# Patient Record
Sex: Female | Born: 1935 | Race: White | Hispanic: No | Smoking: Never smoker
Health system: Southern US, Community
[De-identification: ages and names within clinical notes are randomized; demographics above are authoritative.]

## PROBLEM LIST (undated history)

## (undated) DIAGNOSIS — I509 Heart failure, unspecified: Secondary | ICD-10-CM

## (undated) DIAGNOSIS — I4891 Unspecified atrial fibrillation: Secondary | ICD-10-CM

## (undated) DIAGNOSIS — N182 Chronic kidney disease, stage 2 (mild): Secondary | ICD-10-CM

## (undated) DIAGNOSIS — E1122 Type 2 diabetes mellitus with diabetic chronic kidney disease: Secondary | ICD-10-CM

## (undated) DIAGNOSIS — I10 Essential (primary) hypertension: Secondary | ICD-10-CM

## (undated) DIAGNOSIS — I8390 Asymptomatic varicose veins of unspecified lower extremity: Secondary | ICD-10-CM

## (undated) DIAGNOSIS — Z8673 Personal history of transient ischemic attack (TIA), and cerebral infarction without residual deficits: Secondary | ICD-10-CM

## (undated) DIAGNOSIS — E785 Hyperlipidemia, unspecified: Secondary | ICD-10-CM

## (undated) HISTORY — DX: Chronic kidney disease, stage 2 (mild): N18.2

## (undated) HISTORY — PX: APPENDECTOMY: SHX54

## (undated) HISTORY — DX: Unspecified atrial fibrillation: I48.91

## (undated) HISTORY — PX: KNEE SURGERY: SHX244

## (undated) HISTORY — PX: CHOLECYSTECTOMY: SHX55

## (undated) HISTORY — DX: Heart failure, unspecified: I50.9

## (undated) HISTORY — DX: Type 2 diabetes mellitus with diabetic chronic kidney disease: E11.22

---

## 2004-02-16 ENCOUNTER — Emergency Department: Payer: Self-pay | Admitting: Emergency Medicine

## 2004-02-16 ENCOUNTER — Other Ambulatory Visit: Payer: Self-pay

## 2004-12-02 ENCOUNTER — Ambulatory Visit: Payer: Self-pay | Admitting: Internal Medicine

## 2005-05-01 ENCOUNTER — Emergency Department: Payer: Self-pay | Admitting: Emergency Medicine

## 2005-12-12 ENCOUNTER — Ambulatory Visit: Payer: Self-pay | Admitting: Internal Medicine

## 2006-08-10 ENCOUNTER — Ambulatory Visit: Payer: Self-pay | Admitting: Internal Medicine

## 2007-02-13 ENCOUNTER — Ambulatory Visit: Payer: Self-pay | Admitting: Internal Medicine

## 2008-01-21 ENCOUNTER — Ambulatory Visit: Payer: Self-pay | Admitting: Ophthalmology

## 2008-02-03 ENCOUNTER — Ambulatory Visit: Payer: Self-pay | Admitting: Ophthalmology

## 2008-02-26 ENCOUNTER — Ambulatory Visit: Payer: Self-pay | Admitting: Internal Medicine

## 2009-01-26 ENCOUNTER — Ambulatory Visit: Payer: Self-pay | Admitting: Ophthalmology

## 2009-02-02 ENCOUNTER — Ambulatory Visit: Payer: Self-pay | Admitting: Ophthalmology

## 2009-07-28 ENCOUNTER — Inpatient Hospital Stay: Payer: Self-pay | Admitting: Internal Medicine

## 2010-07-29 ENCOUNTER — Ambulatory Visit: Payer: Self-pay | Admitting: Internal Medicine

## 2011-02-01 ENCOUNTER — Ambulatory Visit: Payer: Self-pay | Admitting: Internal Medicine

## 2011-02-02 ENCOUNTER — Ambulatory Visit: Payer: Self-pay | Admitting: Internal Medicine

## 2011-02-07 ENCOUNTER — Ambulatory Visit: Payer: Self-pay | Admitting: Internal Medicine

## 2011-04-07 DIAGNOSIS — E119 Type 2 diabetes mellitus without complications: Secondary | ICD-10-CM | POA: Diagnosis not present

## 2011-04-07 DIAGNOSIS — E785 Hyperlipidemia, unspecified: Secondary | ICD-10-CM | POA: Diagnosis not present

## 2011-08-11 DIAGNOSIS — E119 Type 2 diabetes mellitus without complications: Secondary | ICD-10-CM | POA: Diagnosis not present

## 2011-08-11 DIAGNOSIS — E785 Hyperlipidemia, unspecified: Secondary | ICD-10-CM | POA: Diagnosis not present

## 2011-08-16 DIAGNOSIS — E119 Type 2 diabetes mellitus without complications: Secondary | ICD-10-CM | POA: Diagnosis not present

## 2011-08-16 DIAGNOSIS — M1990 Unspecified osteoarthritis, unspecified site: Secondary | ICD-10-CM | POA: Diagnosis not present

## 2011-08-16 DIAGNOSIS — E785 Hyperlipidemia, unspecified: Secondary | ICD-10-CM | POA: Diagnosis not present

## 2011-08-16 DIAGNOSIS — R51 Headache: Secondary | ICD-10-CM | POA: Diagnosis not present

## 2011-12-15 DIAGNOSIS — E785 Hyperlipidemia, unspecified: Secondary | ICD-10-CM | POA: Diagnosis not present

## 2011-12-15 DIAGNOSIS — Z23 Encounter for immunization: Secondary | ICD-10-CM | POA: Diagnosis not present

## 2011-12-15 DIAGNOSIS — E119 Type 2 diabetes mellitus without complications: Secondary | ICD-10-CM | POA: Diagnosis not present

## 2011-12-25 DIAGNOSIS — R51 Headache: Secondary | ICD-10-CM | POA: Diagnosis not present

## 2011-12-25 DIAGNOSIS — M1990 Unspecified osteoarthritis, unspecified site: Secondary | ICD-10-CM | POA: Diagnosis not present

## 2011-12-25 DIAGNOSIS — E785 Hyperlipidemia, unspecified: Secondary | ICD-10-CM | POA: Diagnosis not present

## 2011-12-25 DIAGNOSIS — E119 Type 2 diabetes mellitus without complications: Secondary | ICD-10-CM | POA: Diagnosis not present

## 2012-01-12 ENCOUNTER — Emergency Department: Payer: Self-pay | Admitting: Emergency Medicine

## 2012-01-12 DIAGNOSIS — R404 Transient alteration of awareness: Secondary | ICD-10-CM | POA: Diagnosis not present

## 2012-01-12 DIAGNOSIS — E119 Type 2 diabetes mellitus without complications: Secondary | ICD-10-CM | POA: Diagnosis not present

## 2012-01-12 DIAGNOSIS — G43909 Migraine, unspecified, not intractable, without status migrainosus: Secondary | ICD-10-CM | POA: Diagnosis not present

## 2012-01-12 DIAGNOSIS — I6350 Cerebral infarction due to unspecified occlusion or stenosis of unspecified cerebral artery: Secondary | ICD-10-CM | POA: Diagnosis not present

## 2012-01-12 DIAGNOSIS — Z79899 Other long term (current) drug therapy: Secondary | ICD-10-CM | POA: Diagnosis not present

## 2012-01-12 DIAGNOSIS — F29 Unspecified psychosis not due to a substance or known physiological condition: Secondary | ICD-10-CM | POA: Diagnosis not present

## 2012-01-12 LAB — COMPREHENSIVE METABOLIC PANEL
Anion Gap: 8 (ref 7–16)
BUN: 17 mg/dL (ref 7–18)
Bilirubin,Total: 0.5 mg/dL (ref 0.2–1.0)
Calcium, Total: 8.9 mg/dL (ref 8.5–10.1)
Chloride: 106 mmol/L (ref 98–107)
Co2: 23 mmol/L (ref 21–32)
EGFR (African American): >60
EGFR (Non-African Amer.): >60
Osmolality: 280 (ref 275–301)
Potassium: 3.6 mmol/L (ref 3.5–5.1)
SGOT(AST): 33 U/L (ref 15–37)
Sodium: 137 mmol/L (ref 136–145)
Total Protein: 6.9 g/dL (ref 6.4–8.2)

## 2012-01-12 LAB — URINALYSIS, COMPLETE
Blood: NEGATIVE
Glucose,UR: NEGATIVE mg/dL (ref 0–75)
Ph: 6 (ref 4.5–8.0)
Protein: NEGATIVE
RBC,UR: 1 /HPF (ref 0–5)
Squamous Epithelial: <1

## 2012-01-12 LAB — CBC
HCT: 37.9 % (ref 35.0–47.0)
MCHC: 34.8 g/dL (ref 32.0–36.0)
MCV: 92 fL (ref 80–100)
RDW: 15.3 % — ABNORMAL HIGH (ref 11.5–14.5)
WBC: 5.2 10*3/uL (ref 3.6–11.0)

## 2012-01-16 DIAGNOSIS — R413 Other amnesia: Secondary | ICD-10-CM | POA: Diagnosis not present

## 2012-01-16 DIAGNOSIS — E785 Hyperlipidemia, unspecified: Secondary | ICD-10-CM | POA: Diagnosis not present

## 2012-01-16 DIAGNOSIS — R51 Headache: Secondary | ICD-10-CM | POA: Diagnosis not present

## 2012-01-30 ENCOUNTER — Ambulatory Visit: Payer: Self-pay | Admitting: Neurology

## 2012-01-30 DIAGNOSIS — R413 Other amnesia: Secondary | ICD-10-CM | POA: Diagnosis not present

## 2012-01-30 DIAGNOSIS — R51 Headache: Secondary | ICD-10-CM | POA: Diagnosis not present

## 2012-02-12 DIAGNOSIS — R5383 Other fatigue: Secondary | ICD-10-CM | POA: Diagnosis not present

## 2012-02-12 DIAGNOSIS — R5381 Other malaise: Secondary | ICD-10-CM | POA: Diagnosis not present

## 2012-02-12 DIAGNOSIS — G478 Other sleep disorders: Secondary | ICD-10-CM | POA: Diagnosis not present

## 2012-02-12 DIAGNOSIS — R413 Other amnesia: Secondary | ICD-10-CM | POA: Diagnosis not present

## 2012-02-12 DIAGNOSIS — R51 Headache: Secondary | ICD-10-CM | POA: Diagnosis not present

## 2012-03-11 DIAGNOSIS — R51 Headache: Secondary | ICD-10-CM | POA: Diagnosis not present

## 2012-03-11 DIAGNOSIS — E785 Hyperlipidemia, unspecified: Secondary | ICD-10-CM | POA: Diagnosis not present

## 2012-03-11 DIAGNOSIS — R413 Other amnesia: Secondary | ICD-10-CM | POA: Diagnosis not present

## 2012-04-05 DIAGNOSIS — L820 Inflamed seborrheic keratosis: Secondary | ICD-10-CM | POA: Diagnosis not present

## 2012-04-05 DIAGNOSIS — Z85828 Personal history of other malignant neoplasm of skin: Secondary | ICD-10-CM | POA: Diagnosis not present

## 2012-04-09 DIAGNOSIS — R413 Other amnesia: Secondary | ICD-10-CM | POA: Diagnosis not present

## 2012-04-09 DIAGNOSIS — E669 Obesity, unspecified: Secondary | ICD-10-CM | POA: Diagnosis not present

## 2012-04-09 DIAGNOSIS — R5382 Chronic fatigue, unspecified: Secondary | ICD-10-CM | POA: Diagnosis not present

## 2012-04-09 DIAGNOSIS — R51 Headache: Secondary | ICD-10-CM | POA: Diagnosis not present

## 2012-04-17 DIAGNOSIS — H353190 Nonexudative age-related macular degeneration, unspecified eye, stage unspecified: Secondary | ICD-10-CM | POA: Diagnosis not present

## 2012-04-19 DIAGNOSIS — E785 Hyperlipidemia, unspecified: Secondary | ICD-10-CM | POA: Diagnosis not present

## 2012-04-19 DIAGNOSIS — E119 Type 2 diabetes mellitus without complications: Secondary | ICD-10-CM | POA: Diagnosis not present

## 2012-05-22 DIAGNOSIS — M1990 Unspecified osteoarthritis, unspecified site: Secondary | ICD-10-CM | POA: Diagnosis not present

## 2012-05-22 DIAGNOSIS — E119 Type 2 diabetes mellitus without complications: Secondary | ICD-10-CM | POA: Diagnosis not present

## 2012-05-22 DIAGNOSIS — R51 Headache: Secondary | ICD-10-CM | POA: Diagnosis not present

## 2012-05-22 DIAGNOSIS — E785 Hyperlipidemia, unspecified: Secondary | ICD-10-CM | POA: Diagnosis not present

## 2012-07-29 ENCOUNTER — Emergency Department: Payer: Self-pay | Admitting: Emergency Medicine

## 2012-07-29 DIAGNOSIS — E785 Hyperlipidemia, unspecified: Secondary | ICD-10-CM | POA: Diagnosis not present

## 2012-07-29 DIAGNOSIS — Z79899 Other long term (current) drug therapy: Secondary | ICD-10-CM | POA: Diagnosis not present

## 2012-07-29 DIAGNOSIS — S0003XA Contusion of scalp, initial encounter: Secondary | ICD-10-CM | POA: Diagnosis not present

## 2012-07-29 DIAGNOSIS — S0990XA Unspecified injury of head, initial encounter: Secondary | ICD-10-CM | POA: Diagnosis not present

## 2012-07-29 DIAGNOSIS — S1093XA Contusion of unspecified part of neck, initial encounter: Secondary | ICD-10-CM | POA: Diagnosis not present

## 2012-08-28 DIAGNOSIS — E119 Type 2 diabetes mellitus without complications: Secondary | ICD-10-CM | POA: Diagnosis not present

## 2012-08-28 DIAGNOSIS — E785 Hyperlipidemia, unspecified: Secondary | ICD-10-CM | POA: Diagnosis not present

## 2012-09-16 DIAGNOSIS — M1990 Unspecified osteoarthritis, unspecified site: Secondary | ICD-10-CM | POA: Diagnosis not present

## 2012-09-16 DIAGNOSIS — E785 Hyperlipidemia, unspecified: Secondary | ICD-10-CM | POA: Diagnosis not present

## 2012-09-16 DIAGNOSIS — E119 Type 2 diabetes mellitus without complications: Secondary | ICD-10-CM | POA: Diagnosis not present

## 2012-09-16 DIAGNOSIS — R51 Headache: Secondary | ICD-10-CM | POA: Diagnosis not present

## 2012-11-07 DIAGNOSIS — R350 Frequency of micturition: Secondary | ICD-10-CM | POA: Diagnosis not present

## 2012-11-07 DIAGNOSIS — N390 Urinary tract infection, site not specified: Secondary | ICD-10-CM | POA: Diagnosis not present

## 2012-12-03 DIAGNOSIS — Z23 Encounter for immunization: Secondary | ICD-10-CM | POA: Diagnosis not present

## 2013-01-17 DIAGNOSIS — E119 Type 2 diabetes mellitus without complications: Secondary | ICD-10-CM | POA: Diagnosis not present

## 2013-01-17 DIAGNOSIS — N390 Urinary tract infection, site not specified: Secondary | ICD-10-CM | POA: Diagnosis not present

## 2013-01-17 DIAGNOSIS — E785 Hyperlipidemia, unspecified: Secondary | ICD-10-CM | POA: Diagnosis not present

## 2013-01-17 DIAGNOSIS — R51 Headache: Secondary | ICD-10-CM | POA: Diagnosis not present

## 2013-01-27 DIAGNOSIS — E785 Hyperlipidemia, unspecified: Secondary | ICD-10-CM | POA: Diagnosis not present

## 2013-01-27 DIAGNOSIS — M1990 Unspecified osteoarthritis, unspecified site: Secondary | ICD-10-CM | POA: Diagnosis not present

## 2013-01-27 DIAGNOSIS — E119 Type 2 diabetes mellitus without complications: Secondary | ICD-10-CM | POA: Diagnosis not present

## 2013-01-27 DIAGNOSIS — R51 Headache: Secondary | ICD-10-CM | POA: Diagnosis not present

## 2013-03-27 DIAGNOSIS — G44229 Chronic tension-type headache, not intractable: Secondary | ICD-10-CM | POA: Diagnosis not present

## 2013-03-27 DIAGNOSIS — R51 Headache: Secondary | ICD-10-CM | POA: Diagnosis not present

## 2013-03-27 DIAGNOSIS — Z79899 Other long term (current) drug therapy: Secondary | ICD-10-CM | POA: Diagnosis not present

## 2013-03-27 DIAGNOSIS — Z049 Encounter for examination and observation for unspecified reason: Secondary | ICD-10-CM | POA: Diagnosis not present

## 2013-04-03 DIAGNOSIS — D485 Neoplasm of uncertain behavior of skin: Secondary | ICD-10-CM | POA: Diagnosis not present

## 2013-04-03 DIAGNOSIS — L821 Other seborrheic keratosis: Secondary | ICD-10-CM | POA: Diagnosis not present

## 2013-04-03 DIAGNOSIS — Z85828 Personal history of other malignant neoplasm of skin: Secondary | ICD-10-CM | POA: Diagnosis not present

## 2013-04-30 DIAGNOSIS — G44229 Chronic tension-type headache, not intractable: Secondary | ICD-10-CM | POA: Diagnosis not present

## 2013-04-30 DIAGNOSIS — G518 Other disorders of facial nerve: Secondary | ICD-10-CM | POA: Diagnosis not present

## 2013-04-30 DIAGNOSIS — M542 Cervicalgia: Secondary | ICD-10-CM | POA: Diagnosis not present

## 2013-04-30 DIAGNOSIS — R51 Headache: Secondary | ICD-10-CM | POA: Diagnosis not present

## 2013-04-30 DIAGNOSIS — IMO0001 Reserved for inherently not codable concepts without codable children: Secondary | ICD-10-CM | POA: Diagnosis not present

## 2013-05-14 DIAGNOSIS — R51 Headache: Secondary | ICD-10-CM | POA: Diagnosis not present

## 2013-05-14 DIAGNOSIS — M542 Cervicalgia: Secondary | ICD-10-CM | POA: Diagnosis not present

## 2013-05-14 DIAGNOSIS — G518 Other disorders of facial nerve: Secondary | ICD-10-CM | POA: Diagnosis not present

## 2013-05-14 DIAGNOSIS — IMO0001 Reserved for inherently not codable concepts without codable children: Secondary | ICD-10-CM | POA: Diagnosis not present

## 2013-05-22 DIAGNOSIS — J069 Acute upper respiratory infection, unspecified: Secondary | ICD-10-CM | POA: Diagnosis not present

## 2013-05-28 DIAGNOSIS — R51 Headache: Secondary | ICD-10-CM | POA: Diagnosis not present

## 2013-05-28 DIAGNOSIS — G518 Other disorders of facial nerve: Secondary | ICD-10-CM | POA: Diagnosis not present

## 2013-05-28 DIAGNOSIS — M542 Cervicalgia: Secondary | ICD-10-CM | POA: Diagnosis not present

## 2013-05-28 DIAGNOSIS — IMO0001 Reserved for inherently not codable concepts without codable children: Secondary | ICD-10-CM | POA: Diagnosis not present

## 2013-06-03 DIAGNOSIS — C44319 Basal cell carcinoma of skin of other parts of face: Secondary | ICD-10-CM | POA: Diagnosis not present

## 2013-06-03 DIAGNOSIS — Z85828 Personal history of other malignant neoplasm of skin: Secondary | ICD-10-CM | POA: Diagnosis not present

## 2013-06-04 DIAGNOSIS — E119 Type 2 diabetes mellitus without complications: Secondary | ICD-10-CM | POA: Diagnosis not present

## 2013-06-04 DIAGNOSIS — E785 Hyperlipidemia, unspecified: Secondary | ICD-10-CM | POA: Diagnosis not present

## 2013-06-09 DIAGNOSIS — E785 Hyperlipidemia, unspecified: Secondary | ICD-10-CM | POA: Diagnosis not present

## 2013-06-09 DIAGNOSIS — M1990 Unspecified osteoarthritis, unspecified site: Secondary | ICD-10-CM | POA: Diagnosis not present

## 2013-06-09 DIAGNOSIS — R51 Headache: Secondary | ICD-10-CM | POA: Diagnosis not present

## 2013-06-09 DIAGNOSIS — E119 Type 2 diabetes mellitus without complications: Secondary | ICD-10-CM | POA: Diagnosis not present

## 2013-06-11 DIAGNOSIS — IMO0001 Reserved for inherently not codable concepts without codable children: Secondary | ICD-10-CM | POA: Diagnosis not present

## 2013-06-11 DIAGNOSIS — G518 Other disorders of facial nerve: Secondary | ICD-10-CM | POA: Diagnosis not present

## 2013-06-11 DIAGNOSIS — R51 Headache: Secondary | ICD-10-CM | POA: Diagnosis not present

## 2013-06-11 DIAGNOSIS — M542 Cervicalgia: Secondary | ICD-10-CM | POA: Diagnosis not present

## 2013-06-25 DIAGNOSIS — G518 Other disorders of facial nerve: Secondary | ICD-10-CM | POA: Diagnosis not present

## 2013-06-25 DIAGNOSIS — R51 Headache: Secondary | ICD-10-CM | POA: Diagnosis not present

## 2013-06-25 DIAGNOSIS — IMO0001 Reserved for inherently not codable concepts without codable children: Secondary | ICD-10-CM | POA: Diagnosis not present

## 2013-06-25 DIAGNOSIS — M542 Cervicalgia: Secondary | ICD-10-CM | POA: Diagnosis not present

## 2013-07-23 ENCOUNTER — Inpatient Hospital Stay: Payer: Self-pay | Admitting: Student

## 2013-07-23 DIAGNOSIS — E119 Type 2 diabetes mellitus without complications: Secondary | ICD-10-CM | POA: Diagnosis not present

## 2013-07-23 DIAGNOSIS — R9431 Abnormal electrocardiogram [ECG] [EKG]: Secondary | ICD-10-CM | POA: Diagnosis not present

## 2013-07-23 DIAGNOSIS — I5043 Acute on chronic combined systolic (congestive) and diastolic (congestive) heart failure: Secondary | ICD-10-CM | POA: Diagnosis present

## 2013-07-23 DIAGNOSIS — I498 Other specified cardiac arrhythmias: Secondary | ICD-10-CM | POA: Diagnosis present

## 2013-07-23 DIAGNOSIS — I428 Other cardiomyopathies: Secondary | ICD-10-CM | POA: Diagnosis not present

## 2013-07-23 DIAGNOSIS — Z8249 Family history of ischemic heart disease and other diseases of the circulatory system: Secondary | ICD-10-CM | POA: Diagnosis not present

## 2013-07-23 DIAGNOSIS — I509 Heart failure, unspecified: Secondary | ICD-10-CM | POA: Diagnosis not present

## 2013-07-23 DIAGNOSIS — Z833 Family history of diabetes mellitus: Secondary | ICD-10-CM | POA: Diagnosis not present

## 2013-07-23 DIAGNOSIS — I2510 Atherosclerotic heart disease of native coronary artery without angina pectoris: Secondary | ICD-10-CM | POA: Diagnosis present

## 2013-07-23 DIAGNOSIS — Z79899 Other long term (current) drug therapy: Secondary | ICD-10-CM | POA: Diagnosis not present

## 2013-07-23 DIAGNOSIS — J811 Chronic pulmonary edema: Secondary | ICD-10-CM | POA: Diagnosis not present

## 2013-07-23 DIAGNOSIS — I2589 Other forms of chronic ischemic heart disease: Secondary | ICD-10-CM | POA: Diagnosis present

## 2013-07-23 DIAGNOSIS — I10 Essential (primary) hypertension: Secondary | ICD-10-CM | POA: Diagnosis not present

## 2013-07-23 DIAGNOSIS — R0602 Shortness of breath: Secondary | ICD-10-CM | POA: Diagnosis not present

## 2013-07-23 DIAGNOSIS — M7989 Other specified soft tissue disorders: Secondary | ICD-10-CM | POA: Diagnosis not present

## 2013-07-23 DIAGNOSIS — R7989 Other specified abnormal findings of blood chemistry: Secondary | ICD-10-CM | POA: Diagnosis not present

## 2013-07-23 DIAGNOSIS — I214 Non-ST elevation (NSTEMI) myocardial infarction: Secondary | ICD-10-CM | POA: Diagnosis not present

## 2013-07-23 DIAGNOSIS — Z951 Presence of aortocoronary bypass graft: Secondary | ICD-10-CM | POA: Diagnosis not present

## 2013-07-23 DIAGNOSIS — E785 Hyperlipidemia, unspecified: Secondary | ICD-10-CM | POA: Diagnosis not present

## 2013-07-23 LAB — CBC
HCT: 42.5 % (ref 35.0–47.0)
HGB: 13.8 g/dL (ref 12.0–16.0)
MCH: 31.5 pg (ref 26.0–34.0)
MCHC: 32.5 g/dL (ref 32.0–36.0)
MCV: 97 fL (ref 80–100)
Platelet: 156 10*3/uL (ref 150–440)
RBC: 4.39 10*6/uL (ref 3.80–5.20)
RDW: 14.4 % (ref 11.5–14.5)
WBC: 7.8 10*3/uL (ref 3.6–11.0)

## 2013-07-23 LAB — BASIC METABOLIC PANEL
Anion Gap: 12 (ref 7–16)
BUN: 7 mg/dL (ref 7–18)
CO2: 22 mmol/L (ref 21–32)
CREATININE: 0.72 mg/dL (ref 0.60–1.30)
Calcium, Total: 9.6 mg/dL (ref 8.5–10.1)
Chloride: 102 mmol/L (ref 98–107)
EGFR (African American): >60
EGFR (Non-African Amer.): >60
Glucose: 184 mg/dL — ABNORMAL HIGH (ref 65–99)
Osmolality: 275 (ref 275–301)
Potassium: 3.4 mmol/L — ABNORMAL LOW (ref 3.5–5.1)
Sodium: 136 mmol/L (ref 136–145)

## 2013-07-23 LAB — TSH: THYROID STIMULATING HORM: 2.68 u[IU]/mL

## 2013-07-23 LAB — APTT: Activated PTT: 28.6 secs (ref 23.6–35.9)

## 2013-07-23 LAB — PRO B NATRIURETIC PEPTIDE: B-TYPE NATIURETIC PEPTID: 2958 pg/mL — AB (ref 0–450)

## 2013-07-23 LAB — CK-MB
CK-MB: 6.8 ng/mL — AB (ref 0.5–3.6)
CK-MB: 7.1 ng/mL — ABNORMAL HIGH (ref 0.5–3.6)
CK-MB: 6 ng/mL — ABNORMAL HIGH (ref 0.5–3.6)

## 2013-07-23 LAB — TROPONIN I
Troponin-I: 0.09 ng/mL — ABNORMAL HIGH
Troponin-I: 1.4 ng/mL — ABNORMAL HIGH
Troponin-I: 1.8 ng/mL — ABNORMAL HIGH

## 2013-07-23 LAB — PROTIME-INR
INR: 1.1
Prothrombin Time: 13.7 secs (ref 11.5–14.7)

## 2013-07-24 LAB — APTT
ACTIVATED PTT: 43.3 s — AB (ref 23.6–35.9)
ACTIVATED PTT: 36.6 s — AB (ref 23.6–35.9)
Activated PTT: 38.1 secs — ABNORMAL HIGH (ref 23.6–35.9)

## 2013-07-24 LAB — PLATELET COUNT: PLATELETS: 137 10*3/uL — AB (ref 150–440)

## 2013-07-25 ENCOUNTER — Inpatient Hospital Stay
Admission: AD | Admit: 2013-07-25 | Payer: BC Managed Care – PPO | Source: Other Acute Inpatient Hospital | Admitting: Thoracic Surgery (Cardiothoracic Vascular Surgery)

## 2013-07-25 LAB — BASIC METABOLIC PANEL
Anion Gap: 10 (ref 7–16)
BUN: 13 mg/dL (ref 7–18)
CHLORIDE: 100 mmol/L (ref 98–107)
CREATININE: 0.63 mg/dL (ref 0.60–1.30)
Calcium, Total: 9 mg/dL (ref 8.5–10.1)
Co2: 26 mmol/L (ref 21–32)
EGFR (African American): >60
EGFR (Non-African Amer.): >60
GLUCOSE: 106 mg/dL — AB (ref 65–99)
Osmolality: 272 (ref 275–301)
POTASSIUM: 3.6 mmol/L (ref 3.5–5.1)
Sodium: 136 mmol/L (ref 136–145)

## 2013-07-25 LAB — APTT
ACTIVATED PTT: 51 s — AB (ref 23.6–35.9)
ACTIVATED PTT: 46.3 s — AB (ref 23.6–35.9)
Activated PTT: 44 secs — ABNORMAL HIGH (ref 23.6–35.9)

## 2013-07-25 LAB — LIPID PANEL
CHOLESTEROL: 183 mg/dL (ref 0–200)
HDL: 82 mg/dL — AB (ref 40–60)
Ldl Cholesterol, Calc: 86 mg/dL (ref 0–100)
Triglycerides: 76 mg/dL (ref 0–200)
VLDL Cholesterol, Calc: 15 mg/dL (ref 5–40)

## 2013-07-25 LAB — HEMOGLOBIN: HGB: 11.9 g/dL — AB (ref 12.0–16.0)

## 2013-07-25 LAB — PLATELET COUNT: Platelet: 130 10*3/uL — ABNORMAL LOW (ref 150–440)

## 2013-07-25 LAB — MAGNESIUM: Magnesium: 1.4 mg/dL — ABNORMAL LOW

## 2013-07-25 LAB — PROTIME-INR
INR: 1.1
Prothrombin Time: 14 secs (ref 11.5–14.7)

## 2013-07-26 ENCOUNTER — Inpatient Hospital Stay (HOSPITAL_COMMUNITY)
Admission: EM | Admit: 2013-07-26 | Discharge: 2013-08-01 | DRG: 235 | Disposition: A | Discharge status: Skilled Nursing Facility | Payer: Medicare Other | Source: Other Acute Inpatient Hospital | Attending: Thoracic Surgery (Cardiothoracic Vascular Surgery) | Admitting: Thoracic Surgery (Cardiothoracic Vascular Surgery)

## 2013-07-26 ENCOUNTER — Inpatient Hospital Stay (HOSPITAL_COMMUNITY): Payer: Medicare Other

## 2013-07-26 ENCOUNTER — Encounter (HOSPITAL_COMMUNITY): Payer: Self-pay | Admitting: Thoracic Surgery (Cardiothoracic Vascular Surgery)

## 2013-07-26 DIAGNOSIS — D696 Thrombocytopenia, unspecified: Secondary | ICD-10-CM | POA: Diagnosis not present

## 2013-07-26 DIAGNOSIS — E785 Hyperlipidemia, unspecified: Secondary | ICD-10-CM | POA: Diagnosis present

## 2013-07-26 DIAGNOSIS — Z5189 Encounter for other specified aftercare: Secondary | ICD-10-CM | POA: Diagnosis not present

## 2013-07-26 DIAGNOSIS — Z6832 Body mass index (BMI) 32.0-32.9, adult: Secondary | ICD-10-CM | POA: Diagnosis not present

## 2013-07-26 DIAGNOSIS — I2510 Atherosclerotic heart disease of native coronary artery without angina pectoris: Secondary | ICD-10-CM | POA: Diagnosis present

## 2013-07-26 DIAGNOSIS — I5042 Chronic combined systolic (congestive) and diastolic (congestive) heart failure: Secondary | ICD-10-CM | POA: Diagnosis not present

## 2013-07-26 DIAGNOSIS — I2589 Other forms of chronic ischemic heart disease: Secondary | ICD-10-CM | POA: Diagnosis present

## 2013-07-26 DIAGNOSIS — E669 Obesity, unspecified: Secondary | ICD-10-CM | POA: Diagnosis present

## 2013-07-26 DIAGNOSIS — R079 Chest pain, unspecified: Secondary | ICD-10-CM | POA: Diagnosis not present

## 2013-07-26 DIAGNOSIS — J984 Other disorders of lung: Secondary | ICD-10-CM | POA: Diagnosis not present

## 2013-07-26 DIAGNOSIS — I4891 Unspecified atrial fibrillation: Secondary | ICD-10-CM | POA: Diagnosis present

## 2013-07-26 DIAGNOSIS — Z8673 Personal history of transient ischemic attack (TIA), and cerebral infarction without residual deficits: Secondary | ICD-10-CM

## 2013-07-26 DIAGNOSIS — D62 Acute posthemorrhagic anemia: Secondary | ICD-10-CM | POA: Diagnosis not present

## 2013-07-26 DIAGNOSIS — I214 Non-ST elevation (NSTEMI) myocardial infarction: Principal | ICD-10-CM | POA: Diagnosis present

## 2013-07-26 DIAGNOSIS — Z7901 Long term (current) use of anticoagulants: Secondary | ICD-10-CM | POA: Diagnosis not present

## 2013-07-26 DIAGNOSIS — I5043 Acute on chronic combined systolic (congestive) and diastolic (congestive) heart failure: Secondary | ICD-10-CM | POA: Diagnosis not present

## 2013-07-26 DIAGNOSIS — E119 Type 2 diabetes mellitus without complications: Secondary | ICD-10-CM | POA: Diagnosis present

## 2013-07-26 DIAGNOSIS — R7989 Other specified abnormal findings of blood chemistry: Secondary | ICD-10-CM | POA: Diagnosis not present

## 2013-07-26 DIAGNOSIS — Z79899 Other long term (current) drug therapy: Secondary | ICD-10-CM | POA: Diagnosis not present

## 2013-07-26 DIAGNOSIS — R5381 Other malaise: Secondary | ICD-10-CM | POA: Diagnosis present

## 2013-07-26 DIAGNOSIS — J90 Pleural effusion, not elsewhere classified: Secondary | ICD-10-CM | POA: Diagnosis not present

## 2013-07-26 DIAGNOSIS — I509 Heart failure, unspecified: Secondary | ICD-10-CM | POA: Diagnosis present

## 2013-07-26 DIAGNOSIS — I10 Essential (primary) hypertension: Secondary | ICD-10-CM | POA: Diagnosis not present

## 2013-07-26 DIAGNOSIS — I252 Old myocardial infarction: Secondary | ICD-10-CM | POA: Diagnosis not present

## 2013-07-26 DIAGNOSIS — R0602 Shortness of breath: Secondary | ICD-10-CM | POA: Diagnosis not present

## 2013-07-26 DIAGNOSIS — Z951 Presence of aortocoronary bypass graft: Secondary | ICD-10-CM | POA: Diagnosis not present

## 2013-07-26 DIAGNOSIS — J9819 Other pulmonary collapse: Secondary | ICD-10-CM | POA: Diagnosis present

## 2013-07-26 DIAGNOSIS — Z01810 Encounter for preprocedural cardiovascular examination: Secondary | ICD-10-CM | POA: Diagnosis not present

## 2013-07-26 DIAGNOSIS — Z48812 Encounter for surgical aftercare following surgery on the circulatory system: Secondary | ICD-10-CM | POA: Diagnosis not present

## 2013-07-26 HISTORY — DX: Asymptomatic varicose veins of unspecified lower extremity: I83.90

## 2013-07-26 HISTORY — DX: Essential (primary) hypertension: I10

## 2013-07-26 HISTORY — DX: Personal history of transient ischemic attack (TIA), and cerebral infarction without residual deficits: Z86.73

## 2013-07-26 HISTORY — DX: Hyperlipidemia, unspecified: E78.5

## 2013-07-26 LAB — CBC
HEMATOCRIT: 39.2 % (ref 36.0–46.0)
HEMOGLOBIN: 13.3 g/dL (ref 12.0–15.0)
MCH: 32.3 pg (ref 26.0–34.0)
MCHC: 33.9 g/dL (ref 30.0–36.0)
MCV: 95.1 fL (ref 78.0–100.0)
Platelets: DECREASED 10*3/uL (ref 150–400)
RBC: 4.12 MIL/uL (ref 3.87–5.11)
RDW: 14.2 % (ref 11.5–15.5)
WBC: 5.5 10*3/uL (ref 4.0–10.5)

## 2013-07-26 LAB — URINALYSIS, ROUTINE W REFLEX MICROSCOPIC
Bilirubin Urine: NEGATIVE
Glucose, UA: NEGATIVE mg/dL
Hgb urine dipstick: NEGATIVE
Ketones, ur: NEGATIVE mg/dL
NITRITE: NEGATIVE
PH: 7 (ref 5.0–8.0)
Protein, ur: NEGATIVE mg/dL
Specific Gravity, Urine: 1.011 (ref 1.005–1.030)
Urobilinogen, UA: 2 mg/dL — ABNORMAL HIGH (ref 0.0–1.0)

## 2013-07-26 LAB — HEPARIN LEVEL (UNFRACTIONATED): Heparin Unfractionated: 0.29 IU/mL — ABNORMAL LOW (ref 0.30–0.70)

## 2013-07-26 LAB — COMPREHENSIVE METABOLIC PANEL
ALT: 20 U/L (ref 0–35)
AST: 26 U/L (ref 0–37)
Albumin: 3.8 g/dL (ref 3.5–5.2)
Alkaline Phosphatase: 87 U/L (ref 39–117)
BUN: 16 mg/dL (ref 6–23)
CALCIUM: 9.7 mg/dL (ref 8.4–10.5)
CO2: 23 meq/L (ref 19–32)
CREATININE: 0.67 mg/dL (ref 0.50–1.10)
Chloride: 97 mEq/L (ref 96–112)
GFR calc Af Amer: >90 mL/min (ref >90)
GFR, EST NON AFRICAN AMERICAN: 80 mL/min — AB (ref >90)
Glucose, Bld: 109 mg/dL — ABNORMAL HIGH (ref 70–99)
Potassium: 4.4 mEq/L (ref 3.7–5.3)
Sodium: 135 mEq/L — ABNORMAL LOW (ref 137–147)
Total Bilirubin: 0.4 mg/dL (ref 0.3–1.2)
Total Protein: 7 g/dL (ref 6.0–8.3)

## 2013-07-26 LAB — BLOOD GAS, ARTERIAL
Acid-Base Excess: 0.5 mmol/L (ref 0.0–2.0)
Bicarbonate: 24 mEq/L (ref 20.0–24.0)
Drawn by: 330991
FIO2: 0.21 %
O2 Saturation: 97.8 %
PCO2 ART: 34.1 mmHg — AB (ref 35.0–45.0)
PO2 ART: 84.4 mmHg (ref 80.0–100.0)
Patient temperature: 97.7
TCO2: 25.1 mmol/L (ref 0–100)
pH, Arterial: 7.459 — ABNORMAL HIGH (ref 7.350–7.450)

## 2013-07-26 LAB — APTT
ACTIVATED PTT: 56.4 s — AB (ref 23.6–35.9)
aPTT: 49 seconds — ABNORMAL HIGH (ref 24–37)

## 2013-07-26 LAB — GLUCOSE, CAPILLARY: GLUCOSE-CAPILLARY: 128 mg/dL — AB (ref 70–99)

## 2013-07-26 LAB — URINE MICROSCOPIC-ADD ON

## 2013-07-26 LAB — PROTIME-INR
INR: 1.04 (ref 0.00–1.49)
Prothrombin Time: 13.4 seconds (ref 11.6–15.2)

## 2013-07-26 LAB — MRSA PCR SCREENING: MRSA by PCR: NEGATIVE

## 2013-07-26 LAB — ABO/RH: ABO/RH(D): A POS

## 2013-07-26 MED ORDER — PHENYLEPHRINE HCL 10 MG/ML IJ SOLN
30.0000 ug/min | INTRAVENOUS | Status: DC
  Filled 2013-07-26: qty 2

## 2013-07-26 MED ORDER — BISACODYL 5 MG PO TBEC
5.0000 mg | DELAYED_RELEASE_TABLET | Freq: Once | ORAL | Status: AC
  Administered 2013-07-26: 5 mg via ORAL
  Filled 2013-07-26: qty 1

## 2013-07-26 MED ORDER — METOPROLOL TARTRATE 12.5 MG HALF TABLET
12.5000 mg | ORAL_TABLET | Freq: Once | ORAL | Status: DC
  Filled 2013-07-26: qty 1

## 2013-07-26 MED ORDER — CHLORHEXIDINE GLUCONATE CLOTH 2 % EX PADS
6.0000 | MEDICATED_PAD | Freq: Once | CUTANEOUS | Status: AC
  Administered 2013-07-26: 6 via TOPICAL

## 2013-07-26 MED ORDER — ACETAMINOPHEN 325 MG PO TABS
650.0000 mg | ORAL_TABLET | Freq: Four times a day (QID) | ORAL | Status: DC | PRN
  Administered 2013-07-26: 650 mg via ORAL
  Filled 2013-07-26: qty 2

## 2013-07-26 MED ORDER — EPINEPHRINE HCL 1 MG/ML IJ SOLN
0.5000 ug/min | INTRAVENOUS | Status: DC
  Filled 2013-07-26: qty 4

## 2013-07-26 MED ORDER — NITROGLYCERIN IN D5W 200-5 MCG/ML-% IV SOLN
2.0000 ug/min | INTRAVENOUS | Status: DC
  Filled 2013-07-26: qty 250

## 2013-07-26 MED ORDER — ATORVASTATIN CALCIUM 80 MG PO TABS
80.0000 mg | ORAL_TABLET | Freq: Every day | ORAL | Status: DC
  Administered 2013-07-26 – 2013-07-31 (×5): 80 mg via ORAL
  Filled 2013-07-26 (×7): qty 1

## 2013-07-26 MED ORDER — DEXMEDETOMIDINE HCL IN NACL 400 MCG/100ML IV SOLN
0.1000 ug/kg/h | INTRAVENOUS | Status: DC
  Filled 2013-07-26: qty 100

## 2013-07-26 MED ORDER — SODIUM CHLORIDE 0.9 % IV SOLN
INTRAVENOUS | Status: DC
  Filled 2013-07-26 (×2): qty 40

## 2013-07-26 MED ORDER — DOPAMINE-DEXTROSE 3.2-5 MG/ML-% IV SOLN
2.0000 ug/kg/min | INTRAVENOUS | Status: DC
  Filled 2013-07-26: qty 250

## 2013-07-26 MED ORDER — GI COCKTAIL ~~LOC~~
30.0000 mL | Freq: Three times a day (TID) | ORAL | Status: DC | PRN
  Filled 2013-07-26: qty 30

## 2013-07-26 MED ORDER — SODIUM CHLORIDE 0.9 % IV SOLN
INTRAVENOUS | Status: DC
  Filled 2013-07-26: qty 1

## 2013-07-26 MED ORDER — DEXTROSE 5 % IV SOLN
750.0000 mg | INTRAVENOUS | Status: DC
  Filled 2013-07-26: qty 750

## 2013-07-26 MED ORDER — HEPARIN (PORCINE) IN NACL 100-0.45 UNIT/ML-% IJ SOLN
1350.0000 [IU]/h | INTRAMUSCULAR | Status: DC
  Administered 2013-07-26: 1350 [IU]/h via INTRAVENOUS
  Administered 2013-07-26: 1250 [IU]/h via INTRAVENOUS
  Filled 2013-07-26 (×2): qty 250

## 2013-07-26 MED ORDER — ALPRAZOLAM 0.25 MG PO TABS
0.2500 mg | ORAL_TABLET | ORAL | Status: DC | PRN
  Administered 2013-07-26: 0.25 mg via ORAL
  Filled 2013-07-26: qty 1

## 2013-07-26 MED ORDER — PLASMA-LYTE 148 IV SOLN
INTRAVENOUS | Status: AC
  Administered 2013-07-27: 10:00:00
  Filled 2013-07-26: qty 2.5

## 2013-07-26 MED ORDER — DIAZEPAM 2 MG PO TABS
2.0000 mg | ORAL_TABLET | Freq: Once | ORAL | Status: AC
  Administered 2013-07-27: 2 mg via ORAL
  Filled 2013-07-26 (×2): qty 1

## 2013-07-26 MED ORDER — METFORMIN HCL 500 MG PO TABS
500.0000 mg | ORAL_TABLET | Freq: Two times a day (BID) | ORAL | Status: DC
  Administered 2013-07-26: 500 mg via ORAL
  Filled 2013-07-26 (×4): qty 1

## 2013-07-26 MED ORDER — HEPARIN SODIUM (PORCINE) 1000 UNIT/ML IJ SOLN
INTRAMUSCULAR | Status: DC
  Filled 2013-07-26: qty 30

## 2013-07-26 MED ORDER — MAGNESIUM SULFATE 50 % IJ SOLN
40.0000 meq | INTRAMUSCULAR | Status: DC
  Filled 2013-07-26: qty 10

## 2013-07-26 MED ORDER — CEFUROXIME SODIUM 1.5 G IJ SOLR
1.5000 g | INTRAMUSCULAR | Status: AC
  Administered 2013-07-27: 1.5 g via INTRAVENOUS
  Filled 2013-07-26: qty 1.5

## 2013-07-26 MED ORDER — TRIAMTERENE-HCTZ 37.5-25 MG PO CAPS
1.0000 | ORAL_CAPSULE | Freq: Every day | ORAL | Status: DC
  Filled 2013-07-26: qty 1

## 2013-07-26 MED ORDER — POTASSIUM CHLORIDE 2 MEQ/ML IV SOLN
80.0000 meq | INTRAVENOUS | Status: DC
  Filled 2013-07-26: qty 40

## 2013-07-26 MED ORDER — TEMAZEPAM 15 MG PO CAPS: 15.0000 mg | ORAL_CAPSULE | Freq: Once | ORAL | Status: AC | PRN

## 2013-07-26 MED ORDER — VANCOMYCIN HCL 10 G IV SOLR
1500.0000 mg | INTRAVENOUS | Status: AC
  Administered 2013-07-27: 1500 mg via INTRAVENOUS
  Filled 2013-07-26: qty 1500

## 2013-07-26 NOTE — H&P (Signed)
Molly [name] is an 80 y.o. female.   Chief Complaint: shortness of breath  REF MD: Neoma Laming, MD PRIMARY MD: Benita Stabile, MD  HPI: 80 yo woman presented to [facility] ED on Tuesday with a cc/o shortness of breath  80 yo woman with multiple CRF- age, HTN, hyperlipidemia, type II DM. She has no prior history of CAD. She was in her usual state of health until 4 days ago. That morning, while engaging in her routine activities, she experienced the sudden onset of shortness of breath. She also had a vague sensation of heaviness in her chest, but says it wasn't a pain or pressure, but more of sensation she couldn't get a deep breath. She did notice leg swelling. She denies nausea and diaphoresis. She tried to rest but the sensation persisted.She was taken to the ED. She was in CHF and ruled in for a NSTEMI.  She had an echocardiogram which showed an EF of 25-30% and moderate MR and TR. Yesterday she underwent cardiac cath which showed severe left main and 3 vessel disease. EF was again 25-30% with infero-apical akinesis.  She has been pain free since admission.  Past Medical History  Diagnosis Date  . Hypertension   . Hyperlipidemia   . Diabetes mellitus, type II   . Hx of transient ischemic attack (TIA)   . Varicose veins     Past Surgical History  Procedure Laterality Date  . Knee surgery    . Cholecystectomy    . Appendectomy      Family History  Problem Relation Age of Onset  . Coronary artery disease    . Hypertension Mother   . Hyperlipidemia Father    Social History:  reports that she has never smoked. She does not have any smokeless tobacco history on file. Her alcohol and drug histories are not on file.  Allergies: No Known Allergies  Medications Prior to Admission  Medication Sig Dispense Refill  . CRESTOR 40 MG tablet       . gabapentin (NEURONTIN) 300 MG capsule       . metFORMIN (GLUCOPHAGE-XR) 500 MG 24 hr tablet       . triamterene-hydrochlorothiazide (DYAZIDE)  37.5-25 MG per capsule         No results found for this or any previous visit (from the past 48 hour(s)). No results found.  Review of Systems  Constitutional: Positive for malaise/fatigue. Negative for fever, chills and weight loss.  Respiratory: Positive for shortness of breath. Negative for wheezing.   Cardiovascular: Positive for leg swelling. Negative for chest pain.  Gastrointestinal: Negative.   Skin: Positive for rash (lower legs due to swelling).  Neurological:       "ministroke" about a year ago, got lost on the way home- no amaurosis or focal weakness    There were no vitals taken for this visit. Physical Exam  Vitals reviewed. Constitutional: She is oriented to person, place, and time. No distress.  Obese  HENT:  Head: Normocephalic and atraumatic.  Eyes: EOM are normal. Pupils are equal, round, and reactive to light.  Neck: Neck supple. No thyromegaly present.  No carotid bruit  Cardiovascular: Normal rate, regular rhythm, normal heart sounds and intact distal pulses.  Exam reveals no gallop.   No murmur heard. Superficial varicose veins bilaterally  Respiratory: Effort normal and breath sounds normal. She has no wheezes.  GI: Soft. There is no tenderness.  Musculoskeletal: She exhibits edema (1+).  Lymphadenopathy:    She has no cervical  adenopathy.  Neurological: She is alert and oriented to person, place, and time. No cranial nerve deficit.  No focal motor deficit  Skin: Skin is warm and dry.     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 She is s/p a NSTEMI complicated by acute on chronic systolic and diastolic heart failure. CABG is indicated for survival benefit and relief of symptoms.   She had moderate MR and TR by echo initially, but there was no obvious MR on LV gram at catheterization. She will need an intraoperative TEE to assess the valves to determine if annuloplasty is needed.  I discussed   the general nature of the procedure, the need for general anesthesia, and the incisions to be used with the patient and her son. We discussed the expected hospital stay, overall recovery and short and long term outcomes. We discussed the indications, risks, benefits and alternatives. They understand the risks include, but are not limited to death, stroke, MI, DVT/PE, bleeding, possible need for transfusion, infections,other organ system dysfunction including respiratory, renal, or GI complications. She accepts the risks and agrees to proceed.  Plan CABG with TEE and possible mitral repair in AM  [name] 07/26/2013, 3:52 PM

## 2013-07-26 NOTE — Progress Notes (Signed)
ANTICOAGULATION CONSULT NOTE - Follow-up Consult  Pharmacy Consult for Heparin Indication: 3v CAD - awaiting CABG  No Known Allergies  Patient Measurements: Height: 5\' 7"  (170.2 cm) Weight: 202 lb 6.1 oz (91.8 kg) IBW/kg (Calculated) : 61.6 Heparin Dosing Weight: 82 kg  Vital Signs: Temp: 98.2 F (36.8 C) (05/30 1400) Temp src: Oral (05/30 1400) BP: 159/75 mmHg (05/30 1800) Pulse Rate: 88 (05/30 1800)  Labs:  Recent Labs  07/26/13 1645  HGB 13.3  HCT 39.2  PLT PLATELET CLUMPS NOTED ON SMEAR, COUNT APPEARS DECREASED  APTT 49*  LABPROT 13.4  INR 1.04  HEPARINUNFRC 0.29*  CREATININE 0.67    Estimated Creatinine Clearance: 63.1 ml/min (by C-G formula based on Cr of 0.67).  Assessment: 80 y.o. female admitted to [facility] 5/27 for CP, NSTEMI. Cardiac cath at [facility] shows severe 3v CAD. Pt transferred to [facility] for CABG. Heparin level 0.29 (slightly subtherapeutic) on 1250 units/hr. CBC stable, plt clumped. No bleeding noted.  Goal of Therapy:  Heparin level 0.3-0.7 units/ml Monitor platelets by anticoagulation protocol: Yes   Plan:  1. Increase Heparin gtt to 1350 units/hr  2. F/u 8 hr heparin level  Sherlon Handing, PharmD, BCPS Clinical pharmacist, pager [phone] 07/26/2013,7:13 PM

## 2013-07-26 NOTE — Progress Notes (Signed)
ANTICOAGULATION CONSULT NOTE - Initial Consult  Pharmacy Consult for Heparin Indication: 3v CAD - awaiting CABG  No Known Allergies  Patient Measurements:   Ht: 67 inWt: 93 kg Heparin Dosing Weight: 82 kg  Vital Signs:    Labs: No results found for this basename: HGB, HCT, PLT, APTT, LABPROT, INR, HEPARINUNFRC, CREATININE, CKTOTAL, CKMB, TROPONINI,  in the last 72 hours  CrCl is unknown because no creatinine reading has been taken and the patient has no height on file.   Medical History: Past Medical History  Diagnosis Date  . Hypertension   . Hyperlipidemia   . Diabetes mellitus, type II   . Hx of transient ischemic attack (TIA)   . Varicose veins     Medications:  Prescriptions prior to admission  Medication Sig Dispense Refill  . acetaminophen (TYLENOL) 500 MG tablet Take 1,000 mg by mouth every 6 (six) hours as needed for headache.      . CRESTOR 40 MG tablet Take 40 mg by mouth daily.       Marland Kitchen gabapentin (NEURONTIN) 300 MG capsule       . metFORMIN (GLUCOPHAGE-XR) 500 MG 24 hr tablet Take 500 mg by mouth daily.       Marland Kitchen triamterene-hydrochlorothiazide (DYAZIDE) 37.5-25 MG per capsule         Assessment: 81 y.o. female admitted to [facility] 5/27 for CP, NSTEMI. Cardiac cath at [facility] shows severe 3v CAD. Pt transferred to [facility] for CABG. Pt on heparin gtt from [facility] for 3v CAD. Concentration 50 units/ml running at 24.7 ml/hr = 1235 ml/hr. Will get stat heparin level and go ahead and enter our heparin to run at 1250 units/hr. For CABG 5/31 a.m.   Labs 5/29 [facility]: SCr 0.63, Na 136, K 3.6, INR 1.1, Hgb 11.9, Plt 130  Goal of Therapy:  Heparin level 0.3-0.7 units/ml Monitor platelets by anticoagulation protocol: Yes   Plan:  1. Heparin gtt at 1250 units/hr (approximately same dose as current gtt running from [place]) 2. F/u stat heparin level 3. Daily heparin level and CBC  Sherlon Handing, PharmD, BCPS Clinical pharmacist, pager [phone] 07/26/2013,4:48 PM

## 2013-07-27 ENCOUNTER — Inpatient Hospital Stay (HOSPITAL_COMMUNITY): Payer: Medicare Other | Admitting: Anesthesiology

## 2013-07-27 ENCOUNTER — Inpatient Hospital Stay (HOSPITAL_COMMUNITY): Payer: Medicare Other

## 2013-07-27 ENCOUNTER — Encounter (HOSPITAL_COMMUNITY)
Admission: EM | Disposition: A | Discharge status: Skilled Nursing Facility | Payer: BC Managed Care – PPO | Source: Other Acute Inpatient Hospital | Attending: Thoracic Surgery (Cardiothoracic Vascular Surgery)

## 2013-07-27 ENCOUNTER — Encounter (HOSPITAL_COMMUNITY): Payer: Medicare Other | Admitting: Anesthesiology

## 2013-07-27 HISTORY — PX: CORONARY ARTERY BYPASS GRAFT: SHX141

## 2013-07-27 LAB — POCT I-STAT 3, VENOUS BLOOD GAS (G3P V)
Acid-base deficit: 3 mmol/L — ABNORMAL HIGH (ref 0.0–2.0)
Bicarbonate: 24 mEq/L (ref 20.0–24.0)
O2 SAT: 86 %
PH VEN: 7.305 — AB (ref 7.250–7.300)
TCO2: 25 mmol/L (ref 0–100)
pCO2, Ven: 48.2 mmHg (ref 45.0–50.0)
pO2, Ven: 57 mmHg — ABNORMAL HIGH (ref 30.0–45.0)

## 2013-07-27 LAB — POCT I-STAT 3, ART BLOOD GAS (G3+)
ACID-BASE DEFICIT: 4 mmol/L — AB (ref 0.0–2.0)
ACID-BASE DEFICIT: 4 mmol/L — AB (ref 0.0–2.0)
Acid-Base Excess: 1 mmol/L (ref 0.0–2.0)
Acid-base deficit: 3 mmol/L — ABNORMAL HIGH (ref 0.0–2.0)
BICARBONATE: 24.8 meq/L — AB (ref 20.0–24.0)
Bicarbonate: 25.9 mEq/L — ABNORMAL HIGH (ref 20.0–24.0)
Bicarbonate: 25.6 mEq/L — ABNORMAL HIGH (ref 20.0–24.0)
Bicarbonate: 23 mEq/L (ref 20.0–24.0)
Bicarbonate: 21.3 mEq/L (ref 20.0–24.0)
Bicarbonate: 21.3 mEq/L (ref 20.0–24.0)
O2 Saturation: 100 %
O2 Saturation: 100 %
O2 Saturation: 100 %
O2 Saturation: 95 %
O2 Saturation: 95 %
O2 Saturation: 91 %
PH ART: 7.378 (ref 7.350–7.450)
PO2 ART: 65 mmHg — AB (ref 80.0–100.0)
Patient temperature: 36
TCO2: 27 mmol/L (ref 0–100)
TCO2: 26 mmol/L (ref 0–100)
TCO2: 27 mmol/L (ref 0–100)
TCO2: 24 mmol/L (ref 0–100)
TCO2: 22 mmol/L (ref 0–100)
TCO2: 22 mmol/L (ref 0–100)
pCO2 arterial: 42.6 mmHg (ref 35.0–45.0)
pCO2 arterial: 40.7 mmHg (ref 35.0–45.0)
pCO2 arterial: 43.5 mmHg (ref 35.0–45.0)
pCO2 arterial: 41.1 mmHg (ref 35.0–45.0)
pCO2 arterial: 38.9 mmHg (ref 35.0–45.0)
pCO2 arterial: 39.4 mmHg (ref 35.0–45.0)
pH, Arterial: 7.392 (ref 7.350–7.450)
pH, Arterial: 7.392 (ref 7.350–7.450)
pH, Arterial: 7.35 (ref 7.350–7.450)
pH, Arterial: 7.347 — ABNORMAL LOW (ref 7.350–7.450)
pH, Arterial: 7.34 — ABNORMAL LOW (ref 7.350–7.450)
pO2, Arterial: 425 mmHg — ABNORMAL HIGH (ref 80.0–100.0)
pO2, Arterial: 362 mmHg — ABNORMAL HIGH (ref 80.0–100.0)
pO2, Arterial: 272 mmHg — ABNORMAL HIGH (ref 80.0–100.0)
pO2, Arterial: 76 mmHg — ABNORMAL LOW (ref 80.0–100.0)
pO2, Arterial: 78 mmHg — ABNORMAL LOW (ref 80.0–100.0)

## 2013-07-27 LAB — GLUCOSE, CAPILLARY
GLUCOSE-CAPILLARY: 119 mg/dL — AB (ref 70–99)
Glucose-Capillary: 98 mg/dL (ref 70–99)
Glucose-Capillary: 108 mg/dL — ABNORMAL HIGH (ref 70–99)
Glucose-Capillary: 119 mg/dL — ABNORMAL HIGH (ref 70–99)
Glucose-Capillary: 124 mg/dL — ABNORMAL HIGH (ref 70–99)
Glucose-Capillary: 131 mg/dL — ABNORMAL HIGH (ref 70–99)
Glucose-Capillary: 145 mg/dL — ABNORMAL HIGH (ref 70–99)
Glucose-Capillary: 125 mg/dL — ABNORMAL HIGH (ref 70–99)
Glucose-Capillary: 118 mg/dL — ABNORMAL HIGH (ref 70–99)

## 2013-07-27 LAB — POCT I-STAT 4, (NA,K, GLUC, HGB,HCT)
GLUCOSE: 153 mg/dL — AB (ref 70–99)
GLUCOSE: 121 mg/dL — AB (ref 70–99)
Glucose, Bld: 128 mg/dL — ABNORMAL HIGH (ref 70–99)
Glucose, Bld: 113 mg/dL — ABNORMAL HIGH (ref 70–99)
Glucose, Bld: 100 mg/dL — ABNORMAL HIGH (ref 70–99)
Glucose, Bld: 125 mg/dL — ABNORMAL HIGH (ref 70–99)
HCT: 35 % — ABNORMAL LOW (ref 36.0–46.0)
HCT: 26 % — ABNORMAL LOW (ref 36.0–46.0)
HCT: 21 % — ABNORMAL LOW (ref 36.0–46.0)
HCT: 23 % — ABNORMAL LOW (ref 36.0–46.0)
HEMATOCRIT: 31 % — AB (ref 36.0–46.0)
HEMATOCRIT: 28 % — AB (ref 36.0–46.0)
HEMOGLOBIN: 11.9 g/dL — AB (ref 12.0–15.0)
HEMOGLOBIN: 10.5 g/dL — AB (ref 12.0–15.0)
Hemoglobin: 8.8 g/dL — ABNORMAL LOW (ref 12.0–15.0)
Hemoglobin: 7.1 g/dL — ABNORMAL LOW (ref 12.0–15.0)
Hemoglobin: 7.8 g/dL — ABNORMAL LOW (ref 12.0–15.0)
Hemoglobin: 9.5 g/dL — ABNORMAL LOW (ref 12.0–15.0)
Potassium: 3.7 mEq/L (ref 3.7–5.3)
Potassium: 3.7 mEq/L (ref 3.7–5.3)
Potassium: 3.9 mEq/L (ref 3.7–5.3)
Potassium: 4.4 mEq/L (ref 3.7–5.3)
Potassium: 4.7 mEq/L (ref 3.7–5.3)
Potassium: 4 mEq/L (ref 3.7–5.3)
Sodium: 136 mEq/L — ABNORMAL LOW (ref 137–147)
Sodium: 136 mEq/L — ABNORMAL LOW (ref 137–147)
Sodium: 137 mEq/L (ref 137–147)
Sodium: 121 mEq/L — CL (ref 137–147)
Sodium: 133 mEq/L — ABNORMAL LOW (ref 137–147)
Sodium: 136 mEq/L — ABNORMAL LOW (ref 137–147)

## 2013-07-27 LAB — CBC
HCT: 34.1 % — ABNORMAL LOW (ref 36.0–46.0)
HCT: 31.8 % — ABNORMAL LOW (ref 36.0–46.0)
HEMATOCRIT: 27.7 % — AB (ref 36.0–46.0)
HEMOGLOBIN: 10.9 g/dL — AB (ref 12.0–15.0)
Hemoglobin: 11.5 g/dL — ABNORMAL LOW (ref 12.0–15.0)
Hemoglobin: 9.4 g/dL — ABNORMAL LOW (ref 12.0–15.0)
MCH: 32.1 pg (ref 26.0–34.0)
MCH: 32.2 pg (ref 26.0–34.0)
MCH: 32.3 pg (ref 26.0–34.0)
MCHC: 33.7 g/dL (ref 30.0–36.0)
MCHC: 33.9 g/dL (ref 30.0–36.0)
MCHC: 34.3 g/dL (ref 30.0–36.0)
MCV: 95.3 fL (ref 78.0–100.0)
MCV: 94.9 fL (ref 78.0–100.0)
MCV: 94.4 fL (ref 78.0–100.0)
PLATELETS: 127 10*3/uL — AB (ref 150–400)
Platelets: 100 10*3/uL — ABNORMAL LOW (ref 150–400)
Platelets: 142 10*3/uL — ABNORMAL LOW (ref 150–400)
RBC: 3.58 MIL/uL — AB (ref 3.87–5.11)
RBC: 2.92 MIL/uL — ABNORMAL LOW (ref 3.87–5.11)
RBC: 3.37 MIL/uL — AB (ref 3.87–5.11)
RDW: 14.2 % (ref 11.5–15.5)
RDW: 14.2 % (ref 11.5–15.5)
RDW: 14.3 % (ref 11.5–15.5)
WBC: 5.5 10*3/uL (ref 4.0–10.5)
WBC: 9.5 10*3/uL (ref 4.0–10.5)
WBC: 12.1 10*3/uL — ABNORMAL HIGH (ref 4.0–10.5)

## 2013-07-27 LAB — POCT I-STAT, CHEM 8
BUN: 9 mg/dL (ref 6–23)
Calcium, Ion: 1.33 mmol/L — ABNORMAL HIGH (ref 1.13–1.30)
Chloride: 105 mEq/L (ref 96–112)
Creatinine, Ser: 0.6 mg/dL (ref 0.50–1.10)
Glucose, Bld: 136 mg/dL — ABNORMAL HIGH (ref 70–99)
HCT: 32 % — ABNORMAL LOW (ref 36.0–46.0)
HEMOGLOBIN: 10.9 g/dL — AB (ref 12.0–15.0)
POTASSIUM: 3.8 meq/L (ref 3.7–5.3)
SODIUM: 138 meq/L (ref 137–147)
TCO2: 21 mmol/L (ref 0–100)

## 2013-07-27 LAB — HEMOGLOBIN A1C
HEMOGLOBIN A1C: 6 % — AB (ref <5.7)
MEAN PLASMA GLUCOSE: 126 mg/dL — AB (ref <117)

## 2013-07-27 LAB — CREATININE, SERUM
Creatinine, Ser: 0.63 mg/dL (ref 0.50–1.10)
GFR calc Af Amer: >90 mL/min (ref >90)
GFR calc non Af Amer: 81 mL/min — ABNORMAL LOW (ref >90)

## 2013-07-27 LAB — PROTIME-INR
INR: 1.43 (ref 0.00–1.49)
Prothrombin Time: 17.1 seconds — ABNORMAL HIGH (ref 11.6–15.2)

## 2013-07-27 LAB — MAGNESIUM: Magnesium: 2.8 mg/dL — ABNORMAL HIGH (ref 1.5–2.5)

## 2013-07-27 LAB — HEMOGLOBIN AND HEMATOCRIT, BLOOD
HCT: 19.8 % — ABNORMAL LOW (ref 36.0–46.0)
Hemoglobin: 6.9 g/dL — CL (ref 12.0–15.0)

## 2013-07-27 LAB — APTT: APTT: 30 s (ref 24–37)

## 2013-07-27 LAB — PLATELET COUNT: Platelets: 100 10*3/uL — ABNORMAL LOW (ref 150–400)

## 2013-07-27 SURGERY — CORONARY ARTERY BYPASS GRAFTING (CABG)
Anesthesia: General | Site: Chest

## 2013-07-27 MED ORDER — FENTANYL CITRATE 0.05 MG/ML IJ SOLN
INTRAMUSCULAR | Status: AC
  Filled 2013-07-27: qty 5

## 2013-07-27 MED ORDER — DEXMEDETOMIDINE HCL 200 MCG/2ML IV SOLN
200.0000 ug | INTRAVENOUS | Status: DC | PRN
  Administered 2013-07-27: .7 ug/kg/h via INTRAVENOUS

## 2013-07-27 MED ORDER — SODIUM CHLORIDE 0.9 % IV SOLN
INTRAVENOUS | Status: DC
  Administered 2013-07-27: 21:00:00 via INTRAVENOUS
  Filled 2013-07-27 (×2): qty 1

## 2013-07-27 MED ORDER — LACTATED RINGERS IV SOLN
INTRAVENOUS | Status: DC | PRN
  Administered 2013-07-27: 07:00:00 via INTRAVENOUS

## 2013-07-27 MED ORDER — LACTATED RINGERS IV SOLN
INTRAVENOUS | Status: DC
  Administered 2013-07-27: 15:00:00 via INTRAVENOUS

## 2013-07-27 MED ORDER — ROCURONIUM BROMIDE 100 MG/10ML IV SOLN
INTRAVENOUS | Status: DC | PRN
  Administered 2013-07-27: 50 mg via INTRAVENOUS

## 2013-07-27 MED ORDER — VECURONIUM BROMIDE 10 MG IV SOLR
INTRAVENOUS | Status: DC | PRN
  Administered 2013-07-27: 5 mg via INTRAVENOUS
  Administered 2013-07-27: 10 mg via INTRAVENOUS

## 2013-07-27 MED ORDER — DEXTROSE 5 % IV SOLN
1.5000 g | Freq: Two times a day (BID) | INTRAVENOUS | Status: AC
  Administered 2013-07-27 – 2013-07-29 (×4): 1.5 g via INTRAVENOUS
  Filled 2013-07-27 (×5): qty 1.5

## 2013-07-27 MED ORDER — PHENYLEPHRINE HCL 10 MG/ML IJ SOLN
0.0000 ug/min | INTRAVENOUS | Status: DC
  Filled 2013-07-27: qty 2

## 2013-07-27 MED ORDER — STERILE WATER FOR INJECTION IJ SOLN
INTRAMUSCULAR | Status: AC
  Filled 2013-07-27: qty 10

## 2013-07-27 MED ORDER — ACETAMINOPHEN 650 MG RE SUPP
650.0000 mg | Freq: Once | RECTAL | Status: AC
  Administered 2013-07-27: 650 mg via RECTAL

## 2013-07-27 MED ORDER — VECURONIUM BROMIDE 10 MG IV SOLR
INTRAVENOUS | Status: AC
  Filled 2013-07-27: qty 10

## 2013-07-27 MED ORDER — ONDANSETRON HCL 4 MG/2ML IJ SOLN: 4.0000 mg | Freq: Four times a day (QID) | INTRAMUSCULAR | Status: DC | PRN

## 2013-07-27 MED ORDER — MAGNESIUM SULFATE 4000MG/100ML IJ SOLN
4.0000 g | Freq: Once | INTRAMUSCULAR | Status: AC
  Administered 2013-07-27: 4 g via INTRAVENOUS
  Filled 2013-07-27: qty 100

## 2013-07-27 MED ORDER — VANCOMYCIN HCL IN DEXTROSE 1-5 GM/200ML-% IV SOLN
1000.0000 mg | Freq: Once | INTRAVENOUS | Status: AC
  Administered 2013-07-27: 1000 mg via INTRAVENOUS
  Filled 2013-07-27: qty 200

## 2013-07-27 MED ORDER — NITROGLYCERIN IN D5W 200-5 MCG/ML-% IV SOLN
INTRAVENOUS | Status: DC | PRN
  Administered 2013-07-27: 5 ug/min via INTRAVENOUS

## 2013-07-27 MED ORDER — PANTOPRAZOLE SODIUM 40 MG PO TBEC
40.0000 mg | DELAYED_RELEASE_TABLET | Freq: Every day | ORAL | Status: DC
  Administered 2013-07-29 – 2013-08-01 (×4): 40 mg via ORAL
  Filled 2013-07-27 (×4): qty 1

## 2013-07-27 MED ORDER — BISACODYL 10 MG RE SUPP: 10.0000 mg | Freq: Every day | RECTAL | Status: DC

## 2013-07-27 MED ORDER — MIDAZOLAM HCL 2 MG/2ML IJ SOLN
INTRAMUSCULAR | Status: AC
  Filled 2013-07-27: qty 2

## 2013-07-27 MED ORDER — SODIUM CHLORIDE 0.9 % IJ SOLN
3.0000 mL | Freq: Two times a day (BID) | INTRAMUSCULAR | Status: DC
  Administered 2013-07-28 – 2013-07-29 (×3): 3 mL via INTRAVENOUS

## 2013-07-27 MED ORDER — INSULIN REGULAR BOLUS VIA INFUSION
0.0000 [IU] | Freq: Three times a day (TID) | INTRAVENOUS | Status: DC
Start: 2013-07-27 — End: 2013-07-29
  Filled 2013-07-27: qty 10

## 2013-07-27 MED ORDER — ROCURONIUM BROMIDE 50 MG/5ML IV SOLN
INTRAVENOUS | Status: AC
  Filled 2013-07-27: qty 1

## 2013-07-27 MED ORDER — SODIUM CHLORIDE 0.9 % IJ SOLN: 3.0000 mL | INTRAMUSCULAR | Status: DC | PRN

## 2013-07-27 MED ORDER — ACETAMINOPHEN 160 MG/5ML PO SOLN
1000.0000 mg | Freq: Four times a day (QID) | ORAL | Status: DC
Start: 2013-07-28 — End: 2013-08-01
  Filled 2013-07-27: qty 40

## 2013-07-27 MED ORDER — DOPAMINE-DEXTROSE 3.2-5 MG/ML-% IV SOLN
INTRAVENOUS | Status: AC
  Filled 2013-07-27: qty 250

## 2013-07-27 MED ORDER — LACTATED RINGERS IV SOLN: 500.0000 mL | Freq: Once | INTRAVENOUS | Status: AC | PRN

## 2013-07-27 MED ORDER — PROTAMINE SULFATE 10 MG/ML IV SOLN
INTRAVENOUS | Status: AC
  Filled 2013-07-27: qty 25

## 2013-07-27 MED ORDER — MIDAZOLAM HCL 2 MG/2ML IJ SOLN
2.0000 mg | INTRAMUSCULAR | Status: DC | PRN
  Administered 2013-07-27: 2 mg via INTRAVENOUS
  Filled 2013-07-27: qty 2

## 2013-07-27 MED ORDER — PHENYLEPHRINE HCL 10 MG/ML IJ SOLN
10.0000 mg | INTRAVENOUS | Status: DC | PRN
  Administered 2013-07-27: 20 ug/min via INTRAVENOUS

## 2013-07-27 MED ORDER — NITROGLYCERIN IN D5W 200-5 MCG/ML-% IV SOLN: 0.0000 ug/min | INTRAVENOUS | Status: DC

## 2013-07-27 MED ORDER — SODIUM CHLORIDE 0.9 % IV SOLN
250.0000 mL | INTRAVENOUS | Status: DC
Start: 2013-07-28 — End: 2013-07-29

## 2013-07-27 MED ORDER — FAMOTIDINE IN NACL 20-0.9 MG/50ML-% IV SOLN
20.0000 mg | Freq: Two times a day (BID) | INTRAVENOUS | Status: AC
  Administered 2013-07-27: 20 mg via INTRAVENOUS

## 2013-07-27 MED ORDER — DOPAMINE-DEXTROSE 3.2-5 MG/ML-% IV SOLN
3.0000 ug/kg/min | INTRAVENOUS | Status: DC
  Administered 2013-07-27: 3 ug/kg/min via INTRAVENOUS

## 2013-07-27 MED ORDER — MIDAZOLAM HCL 2 MG/2ML IJ SOLN
INTRAMUSCULAR | Status: DC | PRN
  Administered 2013-07-27: 2 mg via INTRAVENOUS
  Administered 2013-07-27: 4 mg via INTRAVENOUS
  Administered 2013-07-27: 2 mg via INTRAVENOUS

## 2013-07-27 MED ORDER — HEMOSTATIC AGENTS (NO CHARGE) OPTIME
TOPICAL | Status: DC | PRN
  Administered 2013-07-27: 1 via TOPICAL

## 2013-07-27 MED ORDER — MORPHINE SULFATE 2 MG/ML IJ SOLN
2.0000 mg | INTRAMUSCULAR | Status: DC | PRN
  Administered 2013-07-27 – 2013-07-28 (×3): 2 mg via INTRAVENOUS
  Filled 2013-07-27 (×3): qty 1

## 2013-07-27 MED ORDER — METOPROLOL TARTRATE 12.5 MG HALF TABLET
12.5000 mg | ORAL_TABLET | Freq: Two times a day (BID) | ORAL | Status: DC
  Administered 2013-07-28 (×2): 12.5 mg via ORAL
  Filled 2013-07-27 (×5): qty 1

## 2013-07-27 MED ORDER — POTASSIUM CHLORIDE 10 MEQ/50ML IV SOLN: 10.0000 meq | INTRAVENOUS | Status: AC

## 2013-07-27 MED ORDER — HEPARIN SODIUM (PORCINE) 1000 UNIT/ML IJ SOLN
INTRAMUSCULAR | Status: AC
  Filled 2013-07-27: qty 1

## 2013-07-27 MED ORDER — CEFUROXIME SODIUM 750 MG IJ SOLR
INTRAMUSCULAR | Status: DC | PRN
  Administered 2013-07-27: 750 mg via INTRAMUSCULAR

## 2013-07-27 MED ORDER — CALCIUM CHLORIDE 10 % IV SOLN
INTRAVENOUS | Status: AC
  Filled 2013-07-27: qty 10

## 2013-07-27 MED ORDER — PROPOFOL 10 MG/ML IV BOLUS
INTRAVENOUS | Status: DC | PRN
  Administered 2013-07-27: 100 mg via INTRAVENOUS

## 2013-07-27 MED ORDER — SODIUM CHLORIDE 0.45 % IV SOLN
INTRAVENOUS | Status: DC
  Administered 2013-07-27: 14:00:00 via INTRAVENOUS

## 2013-07-27 MED ORDER — PROTAMINE SULFATE 10 MG/ML IV SOLN
INTRAVENOUS | Status: DC | PRN
  Administered 2013-07-27: 250 mg via INTRAVENOUS

## 2013-07-27 MED ORDER — SODIUM CHLORIDE 0.9 % IV SOLN
INTRAVENOUS | Status: DC | PRN
  Administered 2013-07-27: 13:00:00 via INTRAVENOUS

## 2013-07-27 MED ORDER — SODIUM CHLORIDE 0.9 % IV SOLN
100.0000 [IU] | INTRAVENOUS | Status: DC | PRN
  Administered 2013-07-27: 2 [IU]/h via INTRAVENOUS

## 2013-07-27 MED ORDER — CALCIUM CHLORIDE 10 % IV SOLN
INTRAVENOUS | Status: DC | PRN
  Administered 2013-07-27: 1 g via INTRAVENOUS

## 2013-07-27 MED ORDER — ALBUMIN HUMAN 5 % IV SOLN
250.0000 mL | INTRAVENOUS | Status: AC | PRN
  Administered 2013-07-27 (×2): 250 mL via INTRAVENOUS

## 2013-07-27 MED ORDER — FENTANYL CITRATE 0.05 MG/ML IJ SOLN
INTRAMUSCULAR | Status: DC | PRN
  Administered 2013-07-27: 250 ug via INTRAVENOUS
  Administered 2013-07-27 (×2): 125 ug via INTRAVENOUS
  Administered 2013-07-27 (×2): 50 ug via INTRAVENOUS
  Administered 2013-07-27: 500 ug via INTRAVENOUS
  Administered 2013-07-27: 100 ug via INTRAVENOUS
  Administered 2013-07-27: 200 ug via INTRAVENOUS
  Administered 2013-07-27: 100 ug via INTRAVENOUS

## 2013-07-27 MED ORDER — METOPROLOL TARTRATE 25 MG/10 ML ORAL SUSPENSION
12.5000 mg | Freq: Two times a day (BID) | ORAL | Status: DC
  Filled 2013-07-27 (×5): qty 5

## 2013-07-27 MED ORDER — 0.9 % SODIUM CHLORIDE (POUR BTL) OPTIME
TOPICAL | Status: DC | PRN
  Administered 2013-07-27: 1000 mL

## 2013-07-27 MED ORDER — PROPOFOL 10 MG/ML IV BOLUS
INTRAVENOUS | Status: AC
  Filled 2013-07-27: qty 20

## 2013-07-27 MED ORDER — METOPROLOL TARTRATE 1 MG/ML IV SOLN: 2.5000 mg | INTRAVENOUS | Status: DC | PRN

## 2013-07-27 MED ORDER — ALBUMIN HUMAN 5 % IV SOLN
INTRAVENOUS | Status: DC | PRN
  Administered 2013-07-27: 13:00:00 via INTRAVENOUS

## 2013-07-27 MED ORDER — GELATIN ABSORBABLE MT POWD
OROMUCOSAL | Status: DC | PRN
  Administered 2013-07-27 (×3): via TOPICAL

## 2013-07-27 MED ORDER — ASPIRIN 81 MG PO CHEW
324.0000 mg | CHEWABLE_TABLET | Freq: Every day | ORAL | Status: DC
  Administered 2013-07-31: 324 mg
  Filled 2013-07-27: qty 4

## 2013-07-27 MED ORDER — ACETAMINOPHEN 160 MG/5ML PO SOLN: 650.0000 mg | Freq: Once | ORAL | Status: AC

## 2013-07-27 MED ORDER — HEPARIN SODIUM (PORCINE) 1000 UNIT/ML IJ SOLN
INTRAMUSCULAR | Status: DC | PRN
  Administered 2013-07-27 (×2): 5000 [IU] via INTRAVENOUS
  Administered 2013-07-27: 22000 [IU] via INTRAVENOUS

## 2013-07-27 MED ORDER — ACETAMINOPHEN 500 MG PO TABS
1000.0000 mg | ORAL_TABLET | Freq: Four times a day (QID) | ORAL | Status: DC
  Administered 2013-07-28 – 2013-08-01 (×15): 1000 mg via ORAL
  Filled 2013-07-27 (×19): qty 2

## 2013-07-27 MED ORDER — OXYCODONE HCL 5 MG PO TABS
5.0000 mg | ORAL_TABLET | ORAL | Status: DC | PRN
  Administered 2013-07-28 – 2013-07-29 (×4): 5 mg via ORAL
  Filled 2013-07-27 (×4): qty 1

## 2013-07-27 MED ORDER — DOCUSATE SODIUM 100 MG PO CAPS
200.0000 mg | ORAL_CAPSULE | Freq: Every day | ORAL | Status: DC
  Administered 2013-07-28 – 2013-08-01 (×5): 200 mg via ORAL
  Filled 2013-07-27 (×5): qty 2

## 2013-07-27 MED ORDER — BISACODYL 5 MG PO TBEC
10.0000 mg | DELAYED_RELEASE_TABLET | Freq: Every day | ORAL | Status: DC
  Administered 2013-07-28 – 2013-07-31 (×3): 10 mg via ORAL
  Filled 2013-07-27 (×3): qty 2

## 2013-07-27 MED ORDER — LIDOCAINE HCL (CARDIAC) 20 MG/ML IV SOLN
INTRAVENOUS | Status: AC
  Filled 2013-07-27: qty 5

## 2013-07-27 MED ORDER — ASPIRIN EC 325 MG PO TBEC
325.0000 mg | DELAYED_RELEASE_TABLET | Freq: Every day | ORAL | Status: DC
  Administered 2013-07-28 – 2013-07-30 (×3): 325 mg via ORAL
  Filled 2013-07-27 (×4): qty 1

## 2013-07-27 MED ORDER — SODIUM CHLORIDE 0.9 % IV SOLN
10.0000 g | INTRAVENOUS | Status: DC | PRN
  Administered 2013-07-27: 12:00:00 via INTRAVENOUS
  Administered 2013-07-27: 5 g/h via INTRAVENOUS

## 2013-07-27 MED ORDER — MORPHINE SULFATE 2 MG/ML IJ SOLN: 1.0000 mg | INTRAMUSCULAR | Status: AC | PRN

## 2013-07-27 MED ORDER — DEXMEDETOMIDINE HCL IN NACL 200 MCG/50ML IV SOLN
0.1000 ug/kg/h | INTRAVENOUS | Status: DC
Start: 2013-07-27 — End: 2013-07-29
  Administered 2013-07-27: 0.2 ug/kg/h via INTRAVENOUS
  Filled 2013-07-27: qty 50

## 2013-07-27 MED ORDER — SODIUM CHLORIDE 0.9 % IV SOLN
INTRAVENOUS | Status: DC
  Administered 2013-07-27 – 2013-07-28 (×2): via INTRAVENOUS

## 2013-07-27 SURGICAL SUPPLY — 86 items
ADH SKN CLS APL DERMABOND .7 (GAUZE/BANDAGES/DRESSINGS) ×1
ATTRACTOMAT 16X20 MAGNETIC DRP (DRAPES) ×3 IMPLANT
BAG DECANTER FOR FLEXI CONT (MISCELLANEOUS) ×3 IMPLANT
BANDAGE ELASTIC 4 VELCRO ST LF (GAUZE/BANDAGES/DRESSINGS) ×3 IMPLANT
BANDAGE ELASTIC 6 VELCRO ST LF (GAUZE/BANDAGES/DRESSINGS) ×3 IMPLANT
BANDAGE GAUZE ELAST BULKY 4 IN (GAUZE/BANDAGES/DRESSINGS) ×3 IMPLANT
BASKET HEART  (ORDER IN 25'S) (MISCELLANEOUS) ×1
BASKET HEART (ORDER IN 25'S) (MISCELLANEOUS) ×1
BASKET HEART (ORDER IN 25S) (MISCELLANEOUS) ×1 IMPLANT
BLADE STERNUM SYSTEM 6 (BLADE) ×3 IMPLANT
CANISTER SUCTION 2500CC (MISCELLANEOUS) ×3 IMPLANT
CANNULA EZ GLIDE AORTIC 21FR (CANNULA) ×3 IMPLANT
CARDIAC SUCTION (MISCELLANEOUS) ×3 IMPLANT
CATH CPB KIT HENDRICKSON (MISCELLANEOUS) ×3 IMPLANT
CATH ROBINSON RED A/P 18FR (CATHETERS) ×5 IMPLANT
CATH THORACIC 36FR (CATHETERS) ×3 IMPLANT
CATH THORACIC 36FR RT ANG (CATHETERS) ×3 IMPLANT
CLIP TI MEDIUM 24 (CLIP) IMPLANT
CLIP TI WIDE RED SMALL 24 (CLIP) ×4 IMPLANT
COVER SURGICAL LIGHT HANDLE (MISCELLANEOUS) ×3 IMPLANT
CRADLE DONUT ADULT HEAD (MISCELLANEOUS) ×3 IMPLANT
DERMABOND ADVANCED (GAUZE/BANDAGES/DRESSINGS) ×2
DERMABOND ADVANCED .7 DNX12 (GAUZE/BANDAGES/DRESSINGS) IMPLANT
DRAPE CARDIOVASCULAR INCISE (DRAPES) ×3
DRAPE SLUSH/WARMER DISC (DRAPES) ×3 IMPLANT
DRAPE SRG 135X102X78XABS (DRAPES) ×1 IMPLANT
DRSG COVADERM 4X14 (GAUZE/BANDAGES/DRESSINGS) ×3 IMPLANT
ELECT REM PT RETURN 9FT ADLT (ELECTROSURGICAL) ×6
ELECTRODE REM PT RTRN 9FT ADLT (ELECTROSURGICAL) ×2 IMPLANT
GLOVE EUDERMIC 7 POWDERFREE (GLOVE) ×3 IMPLANT
GLOVE SURG SIGNA 7.5 PF LTX (GLOVE) ×2 IMPLANT
GOWN STRL REUS W/ TWL LRG LVL3 (GOWN DISPOSABLE) ×4 IMPLANT
GOWN STRL REUS W/ TWL XL LVL3 (GOWN DISPOSABLE) ×2 IMPLANT
GOWN STRL REUS W/TWL LRG LVL3 (GOWN DISPOSABLE) ×12
GOWN STRL REUS W/TWL XL LVL3 (GOWN DISPOSABLE) ×6
HEMOSTAT POWDER SURGIFOAM 1G (HEMOSTASIS) ×9 IMPLANT
HEMOSTAT SURGICEL 2X14 (HEMOSTASIS) ×3 IMPLANT
INSERT FOGARTY XLG (MISCELLANEOUS) IMPLANT
KIT BASIN OR (CUSTOM PROCEDURE TRAY) ×3 IMPLANT
KIT ROOM TURNOVER OR (KITS) ×3 IMPLANT
KIT SUCTION CATH 14FR (SUCTIONS) ×6 IMPLANT
KIT VASOVIEW W/TROCAR VH 2000 (KITS) ×3 IMPLANT
LINE VENT (MISCELLANEOUS) ×2 IMPLANT
MARKER GRAFT CORONARY BYPASS (MISCELLANEOUS) ×9 IMPLANT
NS IRRIG 1000ML POUR BTL (IV SOLUTION) ×15 IMPLANT
PACK OPEN HEART (CUSTOM PROCEDURE TRAY) ×3 IMPLANT
PAD ARMBOARD 7.5X6 YLW CONV (MISCELLANEOUS) ×6 IMPLANT
PAD ELECT DEFIB RADIOL ZOLL (MISCELLANEOUS) ×3 IMPLANT
PENCIL BUTTON HOLSTER BLD 10FT (ELECTRODE) ×3 IMPLANT
PUNCH AORTIC ROTATE 4.0MM (MISCELLANEOUS) IMPLANT
PUNCH AORTIC ROTATE 4.5MM 8IN (MISCELLANEOUS) ×2 IMPLANT
PUNCH AORTIC ROTATE 5MM 8IN (MISCELLANEOUS) IMPLANT
SET CARDIOPLEGIA MPS 5001102 (MISCELLANEOUS) ×2 IMPLANT
SPONGE GAUZE 4X4 12PLY (GAUZE/BANDAGES/DRESSINGS) ×6 IMPLANT
SUT BONE WAX W31G (SUTURE) ×3 IMPLANT
SUT MNCRL AB 4-0 PS2 18 (SUTURE) ×2 IMPLANT
SUT PROLENE 3 0 SH DA (SUTURE) ×3 IMPLANT
SUT PROLENE 4 0 RB 1 (SUTURE)
SUT PROLENE 4 0 SH DA (SUTURE) IMPLANT
SUT PROLENE 4-0 RB1 .5 CRCL 36 (SUTURE) IMPLANT
SUT PROLENE 6 0 C 1 30 (SUTURE) ×12 IMPLANT
SUT PROLENE 7 0 BV1 MDA (SUTURE) ×5 IMPLANT
SUT PROLENE 8 0 BV175 6 (SUTURE) IMPLANT
SUT STEEL 6MS V (SUTURE) ×3 IMPLANT
SUT STEEL STERNAL CCS#1 18IN (SUTURE) IMPLANT
SUT STEEL SZ 6 DBL 3X14 BALL (SUTURE) ×3 IMPLANT
SUT VIC AB 1 CTX 36 (SUTURE) ×6
SUT VIC AB 1 CTX36XBRD ANBCTR (SUTURE) ×2 IMPLANT
SUT VIC AB 2-0 CT1 27 (SUTURE)
SUT VIC AB 2-0 CT1 36 (SUTURE) ×2 IMPLANT
SUT VIC AB 2-0 CT1 TAPERPNT 27 (SUTURE) IMPLANT
SUT VIC AB 2-0 CTX 27 (SUTURE) IMPLANT
SUT VIC AB 3-0 SH 27 (SUTURE) ×9
SUT VIC AB 3-0 SH 27X BRD (SUTURE) IMPLANT
SUT VIC AB 3-0 X1 27 (SUTURE) IMPLANT
SUT VICRYL 4-0 PS2 18IN ABS (SUTURE) IMPLANT
SUTURE E-PAK OPEN HEART (SUTURE) ×3 IMPLANT
SYSTEM SAHARA CHEST DRAIN ATS (WOUND CARE) ×3 IMPLANT
TAPE CLOTH SURG 4X10 WHT LF (GAUZE/BANDAGES/DRESSINGS) ×2 IMPLANT
TOWEL OR 17X24 6PK STRL BLUE (TOWEL DISPOSABLE) ×6 IMPLANT
TOWEL OR 17X26 10 PK STRL BLUE (TOWEL DISPOSABLE) ×6 IMPLANT
TRAY FOLEY IC TEMP SENS 16FR (CATHETERS) ×3 IMPLANT
TUBE FEEDING 8FR 16IN STR KANG (MISCELLANEOUS) ×3 IMPLANT
TUBING INSUFFLATION 10FT LAP (TUBING) ×3 IMPLANT
UNDERPAD 30X30 INCONTINENT (UNDERPADS AND DIAPERS) ×3 IMPLANT
WATER STERILE IRR 1000ML POUR (IV SOLUTION) ×6 IMPLANT

## 2013-07-27 NOTE — Procedures (Signed)
Extubation Procedure Note  Patient Details:   Name: Molly [name] DOB: 1928/10/28 MRN: [id number]   Airway Documentation:     Evaluation  O2 sats: stable throughout Complications: No apparent complications Patient did tolerate procedure well. Bilateral Breath Sounds: Clear;Diminished   Yes  Pt tolerated wean, 587mL VC, -20 NIF, positive for cuff leak. Pt extubated to 4lpm Reed. No dyspnea or stridor noted after extubation. All vitals are within normal limits at this time. RT will continue to monitor.   Mariam Dollar 07/27/2013, 7:50 PM

## 2013-07-27 NOTE — Anesthesia Preprocedure Evaluation (Addendum)
Anesthesia Evaluation  Patient identified by MRN, date of birth, ID band Patient awake    Reviewed: Allergy & Precautions, H&P , NPO status , Patient's Chart, lab work & pertinent test results, reviewed documented beta blocker date and time   Airway Mallampati: II TM Distance: >3 FB Neck ROM: full    Dental  (+) Teeth Intact, Dental Advisory Given   Pulmonary neg pulmonary ROS,          Cardiovascular hypertension, + CAD, + Past MI and +CHF     Neuro/Psych    GI/Hepatic   Endo/Other  diabetes, Well Controlled, Type 2obese  Renal/GU      Musculoskeletal   Abdominal   Peds  Hematology   Anesthesia Other Findings   Reproductive/Obstetrics                          Anesthesia Physical Anesthesia Plan  ASA: III  Anesthesia Plan: General   Post-op Pain Management:    Induction: Intravenous  Airway Management Planned: Oral ETT  Additional Equipment: Arterial line, CVP, PA Cath, TEE and Ultrasound Guidance Line Placement  Intra-op Plan:   Post-operative Plan: Post-operative intubation/ventilation  Informed Consent: I have reviewed the patients History and Physical, chart, labs and discussed the procedure including the risks, benefits and alternatives for the proposed anesthesia with the patient or authorized representative who has indicated his/her understanding and acceptance.     Plan Discussed with: CRNA, Anesthesiologist and Surgeon  Anesthesia Plan Comments:         Anesthesia Quick Evaluation

## 2013-07-27 NOTE — Progress Notes (Signed)
PM ROUNDS  S/p CABG  Just extubated  BP 88/46  Pulse 80  Temp(Src) 98.8 F (37.1 C) (Oral)  Resp 23  Ht 5\' 7"  (1.702 m)  Wt 202 lb 6.1 oz (91.8 kg)  BMI 31.69 kg/m2  SpO2 98% CI= 2.1 on dopamine at 3 mcg/kg/min   Intake/Output Summary (Last 24 hours) at 07/27/13 1939 Last data filed at 07/27/13 1900  Gross per 24 hour  Intake 3615.64 ml  Output   3625 ml  Net  -9.36 ml    Minimal CT output  Doing well early postop

## 2013-07-27 NOTE — Interval H&P Note (Signed)
History and Physical Interval Note:  07/27/2013 7:13 AM  Molly [name]  has presented today for surgery, with the diagnosis of CORONARY ARTERY DISEASE  The various methods of treatment have been discussed with the patient and family. After consideration of risks, benefits and other options for treatment, the patient has consented to  Procedure(s): CORONARY ARTERY BYPASS GRAFTING (CABG) POSS. MITRAL REPAIR (N/A) as a surgical intervention .  The patient's history has been reviewed, patient examined, no change in status, stable for surgery.  I have reviewed the patient's chart and labs.  Questions were answered to the patient's satisfaction.     [name]

## 2013-07-27 NOTE — Anesthesia Postprocedure Evaluation (Signed)
  Anesthesia Post-op Note  Patient: Molly [name]  Procedure(s) Performed: Procedure(s) with comments: CORONARY ARTERY BYPASS GRAFTING (CABG) POSS. MITRAL REPAIR (N/A) - Coronary artery bypass graft times four using left internal mammary artery and right saphenous leg vein using endoscope.  Patient Location: ICU  Anesthesia Type:General  Level of Consciousness: sedated, unresponsive and Patient remains intubated per anesthesia plan  Airway and Oxygen Therapy: Patient remains intubated per anesthesia plan  Post-op Pain: sedated, unable to evaluate  Post-op Assessment: Post-op Vital signs reviewed, Patient's Cardiovascular Status Stable and Respiratory Function Stable  Post-op Vital Signs: Reviewed and stable  Last Vitals:  Filed Vitals:   07/27/13 1330  BP:   Pulse: 80  Temp:   Resp: 18    Complications: No apparent anesthesia complications

## 2013-07-27 NOTE — Progress Notes (Signed)
Echocardiogram Echocardiogram Transesophageal has been performed.  Molly [name] 07/27/2013, 8:42 AM

## 2013-07-27 NOTE — Transfer of Care (Signed)
Immediate Anesthesia Transfer of Care Note  Patient: Molly [name]  Procedure(s) Performed: Procedure(s) with comments: CORONARY ARTERY BYPASS GRAFTING (CABG) POSS. MITRAL REPAIR (N/A) - Coronary artery bypass graft times four using left internal mammary artery and right saphenous leg vein using endoscope.  Patient Location: ICU  Anesthesia Type:General  Level of Consciousness: sedated, unresponsive and Patient remains intubated per anesthesia plan  Airway & Oxygen Therapy: Patient remains intubated per anesthesia plan and Patient placed on Ventilator (see vital sign flow sheet for setting)  Post-op Assessment: Report given to PACU RN and Post -op Vital signs reviewed and stable  Post vital signs: Reviewed and stable  Complications: No apparent anesthesia complications

## 2013-07-27 NOTE — Brief Op Note (Addendum)
07/26/2013 - 07/27/2013  12:08 PM  PATIENT:  Molly [name]  80 y.o. female  PRE-OPERATIVE DIAGNOSIS:  1. S/p NSTEMI 2.CORONARY ARTERY DISEASE (included severe left main disease and LVEF 25-30%) 3.Moderate MR  POST-OPERATIVE DIAGNOSIS:  1. S/p NSTEMI 2.CORONARY ARTERY DISEASE (included severe left main disease and LVEF 25-30%) 3.Mild MR  PROCEDURE:  INTRA OP ECHOCARDIOGRAM, URGENT CORONARY ARTERY BYPASS GRAFTING (CABG) x 4 (LIMA to LAD, SVG SEQUENTIALLY to OM1 and OM2), SVG to PDA) with EVH from right thigh and partial lower leg and open harvest left internal mammary artery  SURGEON:  Surgeon(s) and Role:    * [name], MD - Primary  PHYSICIAN ASSISTANT: Lars Pinks PA-c  ASSISTANTS: Dineen Kid RNFA  ANESTHESIA:   general  EBL:  Total I/O In: 1200 [I.V.:1200] Out: 43 [Urine:280]   DRAINS: Chest tubes placed in the mediastinal and pleural spaces   COUNTS CORRECT:  YES  PLAN OF CARE: Admit to inpatient   PATIENT DISPOSITION:  ICU - intubated and hemodynamically stable.   Delay start of Pharmacological VTE agent (>24hrs) due to surgical blood loss or risk of bleeding: yes  BASELINE WEIGHT: 91.8 kg  XC= 68 min CPB= 94 min  Good targets and conduits PL too small to graft  TEE showed no significant MR

## 2013-07-27 NOTE — Op Note (Signed)
NAMEMarland Kitchen  YUKIKO, MINNICH NO.:  [id number]  MEDICAL RECORD NO.:  [id number]  LOCATION:  2S01C                        FACILITY:  [facility]  PHYSICIAN:  Revonda Standard. Roxan Hockey, M.D.DATE OF BIRTH:  01-08-1929  DATE OF PROCEDURE:  07/27/2013 DATE OF DISCHARGE:                              OPERATIVE REPORT   PREOPERATIVE DIAGNOSIS:  Left main and three-vessel coronary disease status post myocardial infarction.  POSTOPERATIVE DIAGNOSIS:  Left main and three-vessel coronary disease status post myocardial infarction.  PROCEDURE:  Median sternotomy, extracorporeal circulation, coronary artery bypass grafting x4 (left internal mammary artery to left anterior descending, sequential saphenous vein graft to obtuse marginals 1 and 2, saphenous vein graft to posterior descending), endoscopic vein harvest right leg.  SURGEON:  Revonda Standard. Roxan Hockey, M.D.  ASSISTANT:  Lars Pinks, PA  ANESTHESIA:  General.  FINDINGS:  Transesophageal echocardiography revealed inferior apical akinesis, otherwise good wall motion.  No significant mitral regurgitation.  Good quality conduits and good quality targets.  CLINICAL NOTE:  Ms. Goodin is an 80 year old woman with no prior cardiac history, but with multiple cardiac risk factors.  She presented with acute shortness of breath and was found to have a non-ST elevation MI complicated by congestive heart failure.  She underwent cardiac catheterization which revealed severe left main and three-vessel coronary disease.  Ejection fraction was 25-30% with inferior apical akinesis.  She had an echocardiogram which showed moderate MR, but there was no significant MR noted on the left ventriculogram at catheterization.  She was advised to have coronary artery bypass grafting and possible mitral repair.  The indications risks, benefits, and alternatives were discussed in detail with the patient.  She understood and accepted the risks and  agreed to proceed.  OPERATIVE NOTE:  Ms. Volpe was brought to the preoperative holding area on Jul 27, 2013.  Anesthesia placed a Swan-Ganz catheter and arterial blood pressure monitoring line.  Intravenous antibiotics were administered.  She was taken to the operating room, anesthetized, and intubated.  Transesophageal echocardiography was performed.  Please refer to the separately dictated note for full details, but there was only trace to mild mitral regurgitation.  A Foley catheter was placed. The chest, abdomen, and legs were prepped and draped in usual sterile fashion.  An incision was made in the medial aspect of the right leg at the level of the knee.  The greater saphenous vein was identified and was harvested endoscopically from the groin to the upper calf .  It was good quality for the patient's age.  Simultaneously, a median sternotomy was performed and the left internal mammary artery was harvested using standard technique.  It likewise was a good quality conduit.  5000 units of heparin was administered during the vessel harvest.  Remainder of the full heparin dose was given prior to opening the pericardium.  The pericardium was opened. After confirming adequate anticoagulation with ACT measurement, the aorta was cannulated via concentric 2-0 Ethibond pledgeted pursestring sutures.  A dual-stage venous cannula was placed via a pursestring suture in the right atrial appendage. Cardiopulmonary bypass was instituted, and the patient was cooled to 32 degrees Celsius.  The coronary arteries were inspected and anastomotic sites were  chosen.  The posterior descending was graftable, but the posterolateral branch of the right coronary was too small to graft.  The remaining targets were good vessels at the site of the anastomoses.  A foam pad was placed in the pericardium to insulate the heart.  A temperature probe was placed in myocardial septum and a cardioplegic cannula placed  in the ascending aorta.  The aorta was crossclamped.  The left ventricle was emptied via aortic root vent.  Cardiac arrest then was achieved with combination of cold antegrade blood cardioplegia and topical iced saline, 1 L cardioplegia was administered.  There was a rapid diastolic arrest, and there was septal cooling to 10 degrees Celsius.  The following distal anastomoses were performed.  First, a reversed saphenous vein graft was placed end-to-side to the posterior descending branch of the right coronary.  This vessel was totally occluded proximally.  It was 1.5 mm good quality target distally. The vein was anastomosed end-to-side with a running 7-0 Prolene suture.  All anastomoses were probed proximally and distally at their completion prior to tying the suture.  Cardioplegia was administered down the vein grafts at their completion to assess flow and hemostasis, both were good.  Next, a reversed saphenous vein graft was placed sequentially to obtuse marginals 1 and 2.  These were both 1.5 mm good quality target vessels. A side-to-side anastomosis was performed to OM1 and end-to-side OM2 both were done with running 7-0 Prolene sutures.  Both anastomoses probed easily. And there was good flow through the graft with cardioplegia administration.  Additional cardioplegia then was administered down the aortic root.  The left internal mammary artery was brought through a window in the pericardium.  The distal end was beveled.  It was anastomosed end-to- side to the distal LAD.  The LAD was grafted relatively close to the apex where it was free of palpable atherosclerotic disease.  It was a 1.5 mm good quality target vessel.  A probe did pass retrograde in the LAD for good distance before reaching the total occlusion.  The end-to- side anastomosis was performed with a running 8-0 Prolene suture.  At the completion of the mammary to LAD anastomosis, the bulldog clamp was briefly  removed to inspect for hemostasis and rapid septal rewarming was noted.  The bulldog clamp was replaced and the mammary pedicle was tacked to the epicardial surface of the heart with 6- 0 Prolene sutures.  Additional cardioplegia was administered down the aortic root.  The cardioplegic cannula was removed.  The vein grafts were cut to length.  The proximal vein graft anastomoses were performed to 4.5 mm punch aortotomies with running 6-0 Prolene sutures.  At the completion of the final proximal anastomosis, the patient was placed in Trendelenburg position.  Lidocaine was administered.  The aortic root was de-aired. The bulldog clamp was again removed from the left mammary artery.  After de-airing the aortic root., the aortic crossclamp was removed.  Total crossclamp time was 68 minutes.  The patient did not require defibrillation.  While rewarming was completed, all proximal and distal anastomoses were inspected for hemostasis.  Epicardial pacing wires were placed on the right ventricle and right atrium.  The patient was in a bradycardic rhythm.  DDD pacing was initiated at 90 beats per minute.  When the patient had rewarmed to a core temperature of 37 degrees Celsius, she was weaned from cardiopulmonary bypass on the first attempt.  The total bypass time was 94 minutes. The initial cardiac index was  greater than 2 liters/minute/meter squared, and she remained hemodynamically stable throughout the postbypass period.  Postbypass transesophageal echocardiography was essentially unchanged from the prebypass study.  A test dose of protamine was administered and it was well tolerated. The atrial and aortic cannulae were removed.  The remainder of the protamine was administered without incident.  The chest was irrigated with warm saline.  Hemostasis was achieved.  The pericardium was reapproximated over the aortic root with interrupted 3-0 silk sutures. It was not closed over the heart.   The left pleural and mediastinal chest tubes were placed through separate subcostal incisions.  The sternum was closed with a combination of single and double heavy gauge stainless steel wires.  The pectoralis fascia, subcutaneous tissue, and skin were closed in standard fashion.  All sponge, needle, and instrument counts were correct at the end of the procedure.  The patient was taken from the operating room to the postanesthetic care unit in good condition.     Revonda Standard Roxan Hockey, M.D.     SCH/MEDQ  D:  07/27/2013  T:  07/27/2013  Job:  [id number]

## 2013-07-28 ENCOUNTER — Encounter (HOSPITAL_COMMUNITY): Payer: Self-pay | Admitting: Thoracic Surgery (Cardiothoracic Vascular Surgery)

## 2013-07-28 ENCOUNTER — Inpatient Hospital Stay (HOSPITAL_COMMUNITY): Payer: Medicare Other

## 2013-07-28 LAB — GLUCOSE, CAPILLARY
GLUCOSE-CAPILLARY: 106 mg/dL — AB (ref 70–99)
GLUCOSE-CAPILLARY: 107 mg/dL — AB (ref 70–99)
GLUCOSE-CAPILLARY: 109 mg/dL — AB (ref 70–99)
GLUCOSE-CAPILLARY: 109 mg/dL — AB (ref 70–99)
GLUCOSE-CAPILLARY: 112 mg/dL — AB (ref 70–99)
GLUCOSE-CAPILLARY: 117 mg/dL — AB (ref 70–99)
GLUCOSE-CAPILLARY: 138 mg/dL — AB (ref 70–99)
GLUCOSE-CAPILLARY: 95 mg/dL (ref 70–99)
Glucose-Capillary: 113 mg/dL — ABNORMAL HIGH (ref 70–99)
Glucose-Capillary: 111 mg/dL — ABNORMAL HIGH (ref 70–99)
Glucose-Capillary: 108 mg/dL — ABNORMAL HIGH (ref 70–99)
Glucose-Capillary: 127 mg/dL — ABNORMAL HIGH (ref 70–99)
Glucose-Capillary: 105 mg/dL — ABNORMAL HIGH (ref 70–99)
Glucose-Capillary: 116 mg/dL — ABNORMAL HIGH (ref 70–99)
Glucose-Capillary: 115 mg/dL — ABNORMAL HIGH (ref 70–99)
Glucose-Capillary: 116 mg/dL — ABNORMAL HIGH (ref 70–99)
Glucose-Capillary: 130 mg/dL — ABNORMAL HIGH (ref 70–99)

## 2013-07-28 LAB — CBC
HCT: 31.3 % — ABNORMAL LOW (ref 36.0–46.0)
HEMATOCRIT: 28.8 % — AB (ref 36.0–46.0)
HEMOGLOBIN: 9.6 g/dL — AB (ref 12.0–15.0)
Hemoglobin: 10.4 g/dL — ABNORMAL LOW (ref 12.0–15.0)
MCH: 31.7 pg (ref 26.0–34.0)
MCH: 31.5 pg (ref 26.0–34.0)
MCHC: 33.2 g/dL (ref 30.0–36.0)
MCHC: 33.3 g/dL (ref 30.0–36.0)
MCV: 95.4 fL (ref 78.0–100.0)
MCV: 94.4 fL (ref 78.0–100.0)
PLATELETS: 123 10*3/uL — AB (ref 150–400)
Platelets: 93 10*3/uL — ABNORMAL LOW (ref 150–400)
RBC: 3.28 MIL/uL — ABNORMAL LOW (ref 3.87–5.11)
RBC: 3.05 MIL/uL — ABNORMAL LOW (ref 3.87–5.11)
RDW: 14.3 % (ref 11.5–15.5)
RDW: 14.6 % (ref 11.5–15.5)
WBC: 11.2 10*3/uL — AB (ref 4.0–10.5)
WBC: 10.7 10*3/uL — ABNORMAL HIGH (ref 4.0–10.5)

## 2013-07-28 LAB — BASIC METABOLIC PANEL
BUN: 12 mg/dL (ref 6–23)
CHLORIDE: 105 meq/L (ref 96–112)
CO2: 21 mEq/L (ref 19–32)
CREATININE: 0.62 mg/dL (ref 0.50–1.10)
Calcium: 9.2 mg/dL (ref 8.4–10.5)
GFR, EST NON AFRICAN AMERICAN: 82 mL/min — AB (ref >90)
Glucose, Bld: 123 mg/dL — ABNORMAL HIGH (ref 70–99)
Potassium: 3.9 mEq/L (ref 3.7–5.3)
Sodium: 140 mEq/L (ref 137–147)

## 2013-07-28 LAB — MAGNESIUM
MAGNESIUM: 2.3 mg/dL (ref 1.5–2.5)
MAGNESIUM: 1.9 mg/dL (ref 1.5–2.5)

## 2013-07-28 LAB — CREATININE, SERUM
Creatinine, Ser: 0.79 mg/dL (ref 0.50–1.10)
GFR calc Af Amer: 87 mL/min — ABNORMAL LOW (ref >90)
GFR, EST NON AFRICAN AMERICAN: 75 mL/min — AB (ref >90)

## 2013-07-28 MED ORDER — AMIODARONE HCL IN DEXTROSE 360-4.14 MG/200ML-% IV SOLN
60.0000 mg/h | INTRAVENOUS | Status: AC
  Administered 2013-07-28 (×2): 60 mg/h via INTRAVENOUS
  Filled 2013-07-28 (×2): qty 200

## 2013-07-28 MED ORDER — AMIODARONE HCL IN DEXTROSE 360-4.14 MG/200ML-% IV SOLN
30.0000 mg/h | INTRAVENOUS | Status: DC
  Administered 2013-07-28: 30 mg/h via INTRAVENOUS
  Filled 2013-07-28 (×5): qty 200

## 2013-07-28 MED ORDER — AMIODARONE HCL 200 MG PO TABS
200.0000 mg | ORAL_TABLET | Freq: Two times a day (BID) | ORAL | Status: DC
  Administered 2013-07-28 – 2013-08-01 (×8): 200 mg via ORAL
  Filled 2013-07-28 (×9): qty 1

## 2013-07-28 MED ORDER — INSULIN ASPART 100 UNIT/ML ~~LOC~~ SOLN
3.0000 [IU] | Freq: Three times a day (TID) | SUBCUTANEOUS | Status: DC
Start: 2013-07-28 — End: 2013-07-29

## 2013-07-28 MED ORDER — FUROSEMIDE 10 MG/ML IJ SOLN
40.0000 mg | Freq: Once | INTRAMUSCULAR | Status: AC
  Administered 2013-07-28: 40 mg via INTRAVENOUS

## 2013-07-28 MED ORDER — POTASSIUM CHLORIDE 10 MEQ/50ML IV SOLN
10.0000 meq | INTRAVENOUS | Status: AC
  Administered 2013-07-28 (×4): 10 meq via INTRAVENOUS

## 2013-07-28 MED ORDER — INSULIN DETEMIR 100 UNIT/ML ~~LOC~~ SOLN
20.0000 [IU] | Freq: Every day | SUBCUTANEOUS | Status: DC
  Filled 2013-07-28: qty 0.2

## 2013-07-28 MED ORDER — AMIODARONE LOAD VIA INFUSION
150.0000 mg | Freq: Once | INTRAVENOUS | Status: AC
  Administered 2013-07-28: 150 mg via INTRAVENOUS
  Filled 2013-07-28: qty 83.34

## 2013-07-28 MED ORDER — INSULIN ASPART 100 UNIT/ML ~~LOC~~ SOLN
0.0000 [IU] | SUBCUTANEOUS | Status: DC
Start: 2013-07-28 — End: 2013-07-29
  Administered 2013-07-28: 2 [IU] via SUBCUTANEOUS

## 2013-07-28 MED ORDER — INSULIN DETEMIR 100 UNIT/ML ~~LOC~~ SOLN
20.0000 [IU] | Freq: Once | SUBCUTANEOUS | Status: AC
  Administered 2013-07-28: 20 [IU] via SUBCUTANEOUS
  Filled 2013-07-28: qty 0.2

## 2013-07-28 MED ORDER — ENOXAPARIN SODIUM 40 MG/0.4ML ~~LOC~~ SOLN
40.0000 mg | Freq: Every day | SUBCUTANEOUS | Status: DC
  Administered 2013-07-28 – 2013-07-31 (×4): 40 mg via SUBCUTANEOUS
  Filled 2013-07-28 (×5): qty 0.4

## 2013-07-28 MED ORDER — AMIODARONE HCL 200 MG PO TABS: 200.0000 mg | ORAL_TABLET | Freq: Every day | ORAL | Status: DC

## 2013-07-28 MED FILL — Mannitol IV Soln 20%: INTRAVENOUS | Qty: 500 | Status: AC

## 2013-07-28 MED FILL — Lidocaine HCl IV Inj 20 MG/ML: INTRAVENOUS | Qty: 5 | Status: AC

## 2013-07-28 MED FILL — Sodium Chloride IV Soln 0.9%: INTRAVENOUS | Qty: 2000 | Status: AC

## 2013-07-28 MED FILL — Magnesium Sulfate Inj 50%: INTRAMUSCULAR | Qty: 10 | Status: AC

## 2013-07-28 MED FILL — Potassium Chloride Inj 2 mEq/ML: INTRAVENOUS | Qty: 40 | Status: AC

## 2013-07-28 MED FILL — Electrolyte-R (PH 7.4) Solution: INTRAVENOUS | Qty: 4000 | Status: AC

## 2013-07-28 MED FILL — Sodium Bicarbonate IV Soln 8.4%: INTRAVENOUS | Qty: 50 | Status: AC

## 2013-07-28 MED FILL — Heparin Sodium (Porcine) Inj 1000 Unit/ML: INTRAMUSCULAR | Qty: 30 | Status: AC

## 2013-07-28 MED FILL — Dexmedetomidine HCl IV Soln 200 MCG/2ML: INTRAVENOUS | Qty: 2 | Status: AC

## 2013-07-28 NOTE — Progress Notes (Signed)
CT surgery p.m. Rounds  Patient resting comfortably in bed Atrially paced 70 beats per minute Amiodarone infusion being administered Stable day

## 2013-07-28 NOTE — Progress Notes (Signed)
1 Day Post-Op Procedure(s) (LRB): CORONARY ARTERY BYPASS GRAFTING (CABG) POSS. MITRAL REPAIR (N/A) Subjective: Denies pain Says she doesn't remember needing surgery, knows she is in a hospital  Objective: Vital signs in last 24 hours: Temp:  [95.9 F (35.5 C)-99.3 F (37.4 C)] 99 F (37.2 C) (06/01 0600) Pulse Rate:  [69-81] 72 (06/01 0600) Cardiac Rhythm:  [-] Atrial fibrillation;Bundle branch block (06/01 0600) Resp:  [11-29] 22 (06/01 0600) BP: (84-123)/(40-61) 120/57 mmHg (06/01 0600) SpO2:  [92 %-100 %] 100 % (06/01 0600) Arterial Line BP: (90-137)/(27-73) 122/57 mmHg (06/01 0600) FiO2 (%):  [40 %-50 %] 40 % (05/31 1752) Weight:  [205 lb 7.5 oz (93.2 kg)] 205 lb 7.5 oz (93.2 kg) (06/01 0500)  Hemodynamic parameters for last 24 hours: PAP: (24-48)/(11-28) 34/16 mmHg CO:  [3.2 L/min-4.9 L/min] 4.3 L/min CI:  [1.6 L/min/m2-2.4 L/min/m2] 2.1 L/min/m2  Intake/Output from previous day: 05/31 0701 - 06/01 0700 In: 3989.7 [I.V.:3139.7; Blood:300; IV Piggyback:550] Out: 7026 [Urine:2335; Blood:600; Chest Tube:390] Intake/Output this shift:    General appearance: alert and no distress Neurologic: short term memory loss, no focal motor deficit Heart: regular rate and rhythm Lungs: diminished breath sounds bibasilar Abdomen: normal findings: soft, non-tender  Lab Results:  Recent Labs  07/27/13 1938 07/27/13 1946 07/28/13 0300  WBC 12.1*  --  11.2*  HGB 10.9* 10.9* 10.4*  HCT 31.8* 32.0* 31.3*  PLT 142*  --  123*   BMET:  Recent Labs  07/26/13 1645  07/27/13 1946 07/28/13 0300  NA 135*  < > 138 140  K 4.4  < > 3.8 3.9  CL 97  --  105 105  CO2 23  --   --  21  GLUCOSE 109*  < > 136* 123*  BUN 16  --  9 12  CREATININE 0.67  < > 0.60 0.62  CALCIUM 9.7  --   --  9.2  < > = values in this interval not displayed.  PT/INR:  Recent Labs  07/27/13 1340  LABPROT 17.1*  INR 1.43   ABG    Component Value Date/Time   PHART 7.340* 07/27/2013 1941   HCO3 21.3  07/27/2013 1941   TCO2 21 07/27/2013 1946   ACIDBASEDEF 4.0* 07/27/2013 1941   O2SAT 91.0 07/27/2013 1941   CBG (last 3)   Recent Labs  07/28/13 0401 07/28/13 0502 07/28/13 0604  GLUCAP 111* 108* 127*    Assessment/Plan: S/P Procedure(s) (LRB): CORONARY ARTERY BYPASS GRAFTING (CABG) POSS. MITRAL REPAIR (N/A) - CV- intermittent a fib- start amiodarone  Wean dopamine  RESP- IS for atelectasis  RENAL- diurese  ENDO- CBG well controlled, transition to [place]  Dc CT  OOB   LOS: 2 days    Molly [name] 07/28/2013

## 2013-07-29 ENCOUNTER — Inpatient Hospital Stay (HOSPITAL_COMMUNITY): Payer: Medicare Other

## 2013-07-29 LAB — BASIC METABOLIC PANEL
BUN: 16 mg/dL (ref 6–23)
CHLORIDE: 100 meq/L (ref 96–112)
CO2: 22 meq/L (ref 19–32)
Calcium: 9.4 mg/dL (ref 8.4–10.5)
Creatinine, Ser: 0.77 mg/dL (ref 0.50–1.10)
GFR calc Af Amer: 88 mL/min — ABNORMAL LOW (ref >90)
GFR calc non Af Amer: 76 mL/min — ABNORMAL LOW (ref >90)
Glucose, Bld: 105 mg/dL — ABNORMAL HIGH (ref 70–99)
Potassium: 4.4 mEq/L (ref 3.7–5.3)
Sodium: 135 mEq/L — ABNORMAL LOW (ref 137–147)

## 2013-07-29 LAB — CBC
HEMATOCRIT: 27.3 % — AB (ref 36.0–46.0)
HEMOGLOBIN: 9.3 g/dL — AB (ref 12.0–15.0)
MCH: 32.2 pg (ref 26.0–34.0)
MCHC: 34.1 g/dL (ref 30.0–36.0)
MCV: 94.5 fL (ref 78.0–100.0)
Platelets: 107 10*3/uL — ABNORMAL LOW (ref 150–400)
RBC: 2.89 MIL/uL — ABNORMAL LOW (ref 3.87–5.11)
RDW: 14.6 % (ref 11.5–15.5)
WBC: 11.5 10*3/uL — ABNORMAL HIGH (ref 4.0–10.5)

## 2013-07-29 LAB — GLUCOSE, CAPILLARY
GLUCOSE-CAPILLARY: 147 mg/dL — AB (ref 70–99)
Glucose-Capillary: 102 mg/dL — ABNORMAL HIGH (ref 70–99)
Glucose-Capillary: 104 mg/dL — ABNORMAL HIGH (ref 70–99)
Glucose-Capillary: 107 mg/dL — ABNORMAL HIGH (ref 70–99)
Glucose-Capillary: 109 mg/dL — ABNORMAL HIGH (ref 70–99)
Glucose-Capillary: 124 mg/dL — ABNORMAL HIGH (ref 70–99)
Glucose-Capillary: 137 mg/dL — ABNORMAL HIGH (ref 70–99)

## 2013-07-29 MED ORDER — INSULIN ASPART 100 UNIT/ML ~~LOC~~ SOLN
0.0000 [IU] | Freq: Three times a day (TID) | SUBCUTANEOUS | Status: DC
  Administered 2013-07-29 – 2013-07-30 (×2): 2 [IU] via SUBCUTANEOUS

## 2013-07-29 MED ORDER — METOPROLOL TARTRATE 25 MG PO TABS
25.0000 mg | ORAL_TABLET | Freq: Two times a day (BID) | ORAL | Status: DC
  Administered 2013-07-29 – 2013-08-01 (×6): 25 mg via ORAL
  Filled 2013-07-29 (×8): qty 1

## 2013-07-29 MED ORDER — AMIODARONE IV BOLUS ONLY 150 MG/100ML
150.0000 mg | Freq: Once | INTRAVENOUS | Status: AC
  Administered 2013-07-29: 150 mg via INTRAVENOUS
  Filled 2013-07-29: qty 100

## 2013-07-29 MED ORDER — GUAIFENESIN-DM 100-10 MG/5ML PO SYRP: 15.0000 mL | ORAL_SOLUTION | ORAL | Status: DC | PRN

## 2013-07-29 MED ORDER — SODIUM CHLORIDE 0.9 % IJ SOLN: 3.0000 mL | INTRAMUSCULAR | Status: DC | PRN

## 2013-07-29 MED ORDER — TRAMADOL HCL 50 MG PO TABS: 50.0000 mg | ORAL_TABLET | ORAL | Status: DC | PRN

## 2013-07-29 MED ORDER — SODIUM CHLORIDE 0.9 % IJ SOLN: 3.0000 mL | Freq: Two times a day (BID) | INTRAMUSCULAR | Status: DC

## 2013-07-29 MED ORDER — METFORMIN HCL ER 500 MG PO TB24
1000.0000 mg | ORAL_TABLET | Freq: Two times a day (BID) | ORAL | Status: DC
  Administered 2013-07-29 – 2013-08-01 (×7): 1000 mg via ORAL
  Filled 2013-07-29 (×9): qty 2

## 2013-07-29 MED ORDER — FUROSEMIDE 40 MG PO TABS
40.0000 mg | ORAL_TABLET | Freq: Every day | ORAL | Status: DC
  Administered 2013-07-29 – 2013-08-01 (×4): 40 mg via ORAL
  Filled 2013-07-29 (×4): qty 1

## 2013-07-29 MED ORDER — ACETAMINOPHEN 325 MG PO TABS: 650.0000 mg | ORAL_TABLET | ORAL | Status: DC | PRN

## 2013-07-29 MED ORDER — SODIUM CHLORIDE 0.9 % IV SOLN: 250.0000 mL | INTRAVENOUS | Status: DC | PRN

## 2013-07-29 MED ORDER — POTASSIUM CHLORIDE CRYS ER 20 MEQ PO TBCR
20.0000 meq | EXTENDED_RELEASE_TABLET | Freq: Two times a day (BID) | ORAL | Status: DC
  Administered 2013-07-29 – 2013-08-01 (×7): 20 meq via ORAL
  Filled 2013-07-29 (×8): qty 1

## 2013-07-29 MED ORDER — ACETAMINOPHEN 650 MG RE SUPP: 650.0000 mg | RECTAL | Status: DC | PRN

## 2013-07-29 MED ORDER — MAGNESIUM HYDROXIDE 400 MG/5ML PO SUSP: 30.0000 mL | Freq: Every day | ORAL | Status: DC | PRN

## 2013-07-29 MED ORDER — MOVING RIGHT ALONG BOOK
Freq: Once | Status: AC
  Administered 2013-07-29: 20:00:00
  Filled 2013-07-29: qty 1

## 2013-07-29 MED ORDER — SODIUM CHLORIDE 0.9 % IJ SOLN
3.0000 mL | Freq: Two times a day (BID) | INTRAMUSCULAR | Status: DC
  Administered 2013-07-29 – 2013-08-01 (×6): 3 mL via INTRAVENOUS

## 2013-07-29 MED ORDER — METOPROLOL TARTRATE 25 MG/10 ML ORAL SUSPENSION
25.0000 mg | Freq: Two times a day (BID) | ORAL | Status: DC
  Filled 2013-07-29 (×8): qty 10

## 2013-07-29 MED ORDER — ALUM & MAG HYDROXIDE-SIMETH 200-200-20 MG/5ML PO SUSP: 15.0000 mL | ORAL | Status: DC | PRN

## 2013-07-29 NOTE — Evaluation (Signed)
Physical Therapy Evaluation Patient Details Name: Molly [name] MRN: [id number] DOB: Mar 24, 1928 Today's Date: 07/29/2013   History of Present Illness  Pt adm with NSTEMI and underwent CABG x 4 on 5/31. PMH-HTN, DM  Clinical Impression  Pt admitted with above. Pt currently with functional limitations due to the deficits listed below (see PT Problem List).  Pt will benefit from skilled PT to increase their independence and safety with mobility to allow discharge to the venue listed below. Feel pt will need ST-SNF prior to returning home alone.      Follow Up Recommendations SNF    Equipment Recommendations  Rolling walker with 5" wheels    Recommendations for Other Services       Precautions / Restrictions Precautions Precautions: Sternal;Fall      Mobility  Bed Mobility                  Transfers Overall transfer level: Needs assistance Equipment used: Pushed w/c Transfers: Sit to/from Stand Sit to Stand: Mod assist;+2 safety/equipment         General transfer comment: Verbal/tactile cues for hand placement. Assist to bring hips up.  Ambulation/Gait Ambulation/Gait assistance: Min assist;+2 physical assistance Ambulation Distance (Feet): 120 Feet Assistive device:  (Pushing w/c) Gait Pattern/deviations: Step-through pattern;Decreased step length - right;Decreased step length - left;Trendelenburg;Antalgic     General Gait Details: Pt with limp on rt.  Stairs            Wheelchair Mobility    Modified Rankin (Stroke Patients Only)       Balance Overall balance assessment: Needs assistance Sitting-balance support: No upper extremity supported;Feet supported Sitting balance-Leahy Scale: Good     Standing balance support: Bilateral upper extremity supported Standing balance-Leahy Scale: Poor                               Pertinent Vitals/Pain Dyspnea 3/4 at times during ambulation. SaO2 not reading. Placed O2 at 2L during amb. SaO2  100% when reading picked up.    Home Living Family/patient expects to be discharged to:: Private residence Living Arrangements: Alone   Type of Home: House Home Access: Stairs to enter   [employer] of Steps: 2 Home Layout: Laundry or work area in basement;Two level Home Equipment: Cane - single point      Prior Function Level of Independence: Independent with assistive device(s)         Comments: Just started using cane     Hand Dominance        Extremity/Trunk Assessment   Upper Extremity Assessment: Generalized weakness           Lower Extremity Assessment: Generalized weakness         Communication   Communication: No difficulties  Cognition Arousal/Alertness: Awake/alert Behavior During Therapy: WFL for tasks assessed/performed Overall Cognitive Status: Within Functional Limits for tasks assessed                      General Comments      Exercises        Assessment/Plan    PT Assessment Patient needs continued PT services  PT Diagnosis Difficulty walking;Generalized weakness   PT Problem List Decreased strength;Decreased activity tolerance;Decreased balance;Decreased mobility;Decreased knowledge of use of DME;Decreased knowledge of precautions  PT Treatment Interventions DME instruction;Gait training;Functional mobility training;Therapeutic activities;Therapeutic exercise;Balance training;Patient/family education   PT Goals (Current goals can be found in the Care Plan section)  Acute Rehab PT Goals Patient Stated Goal: return to prior level of function PT Goal Formulation: With patient Time For Goal Achievement: 08/05/13 Potential to Achieve Goals: Good    Frequency Min 3X/week   Barriers to discharge Decreased caregiver support      Co-evaluation               End of Session   Activity Tolerance: Patient limited by fatigue Patient left: in chair;with call bell/phone within reach;with chair alarm set;with  family/visitor present Nurse Communication: Mobility status         Time: 1300-1333 PT Time Calculation (min): 33 min   Charges:   PT Evaluation $Initial PT Evaluation Tier I: 1 Procedure PT Treatments $Gait Training: 23-37 mins   PT G CodesShary [name] Molly [name] 07/29/2013, 3:27 PM  [employer] PT [phone]

## 2013-07-29 NOTE — Progress Notes (Signed)
2 Days Post-Op Procedure(s) (LRB): CORONARY ARTERY BYPASS GRAFTING (CABG) POSS. MITRAL REPAIR (N/A) Subjective: Feels better this Am Denies pain and nausea  Objective: Vital signs in last 24 hours: Temp:  [97.5 F (36.4 C)-98.6 F (37 C)] 98.1 F (36.7 C) (06/02 0727) Pulse Rate:  [66-91] 91 (06/02 0700) Cardiac Rhythm:  [-] Atrial paced (06/02 0400) Resp:  [13-30] 20 (06/02 0700) BP: (100-152)/(43-96) 110/54 mmHg (06/02 0700) SpO2:  [95 %-100 %] 95 % (06/02 0700) Arterial Line BP: (117-139)/(51-65) 127/57 mmHg (06/01 1400) Weight:  [212 lb 1.3 oz (96.2 kg)] 212 lb 1.3 oz (96.2 kg) (06/02 0600)  Hemodynamic parameters for last 24 hours: PAP: (41-44)/(20-26) 41/20 mmHg CO:  [3.7 L/min-3.8 L/min] 3.8 L/min CI:  [1.8 L/min/m2-1.9 L/min/m2] 1.9 L/min/m2  Intake/Output from previous day: 06/01 0701 - 06/02 0700 In: 1608.1 [I.V.:1308.1; IV Piggyback:300] Out: 1696 [Urine:1450; Chest Tube:20] Intake/Output this shift:    General appearance: alert and no distress Neurologic: intact Heart: irregularly irregular rhythm Lungs: diminished breath sounds bibasilar Abdomen: normal findings: soft, non-tender  Lab Results:  Recent Labs  07/28/13 1620 07/29/13 0400  WBC 10.7* 11.5*  HGB 9.6* 9.3*  HCT 28.8* 27.3*  PLT 93* 107*   BMET:  Recent Labs  07/28/13 0300 07/28/13 1620 07/29/13 0400  NA 140  --  135*  K 3.9  --  4.4  CL 105  --  100  CO2 21  --  22  GLUCOSE 123*  --  105*  BUN 12  --  16  CREATININE 0.62 0.79 0.77  CALCIUM 9.2  --  9.4    PT/INR:  Recent Labs  07/27/13 1340  LABPROT 17.1*  INR 1.43   ABG    Component Value Date/Time   PHART 7.340* 07/27/2013 1941   HCO3 21.3 07/27/2013 1941   TCO2 21 07/27/2013 1946   ACIDBASEDEF 4.0* 07/27/2013 1941   O2SAT 91.0 07/27/2013 1941   CBG (last 3)   Recent Labs  07/28/13 1924 07/29/13 0022 07/29/13 0356  GLUCAP 130* 137* 104*    Assessment/Plan: S/P Procedure(s) (LRB): CORONARY ARTERY BYPASS  GRAFTING (CABG) POSS. MITRAL REPAIR (N/A) Plan for transfer to step-down: see transfer orders CV- back in atrial fib this AM  rebolus with amiodarone  Increase lopressor to 25 mg BID  RESP- LLL atelectasis on CXR- IS  RENAL- lytes and creatinine OK  ENDO- CBG well controlled- resume metformin, dc levemir  Anemia secondary to ABL- mild follow  Thrombocytopenia- improved  Transfer to PTCU when bed available   LOS: 3 days    [name] 07/29/2013

## 2013-07-30 ENCOUNTER — Inpatient Hospital Stay (HOSPITAL_COMMUNITY): Payer: Medicare Other

## 2013-07-30 LAB — CBC
HEMATOCRIT: 26.8 % — AB (ref 36.0–46.0)
Hemoglobin: 8.9 g/dL — ABNORMAL LOW (ref 12.0–15.0)
MCH: 31.4 pg (ref 26.0–34.0)
MCHC: 33.2 g/dL (ref 30.0–36.0)
MCV: 94.7 fL (ref 78.0–100.0)
Platelets: 106 10*3/uL — ABNORMAL LOW (ref 150–400)
RBC: 2.83 MIL/uL — AB (ref 3.87–5.11)
RDW: 14.5 % (ref 11.5–15.5)
WBC: 10.2 10*3/uL (ref 4.0–10.5)

## 2013-07-30 LAB — GLUCOSE, CAPILLARY
GLUCOSE-CAPILLARY: 100 mg/dL — AB (ref 70–99)
GLUCOSE-CAPILLARY: 100 mg/dL — AB (ref 70–99)
Glucose-Capillary: 122 mg/dL — ABNORMAL HIGH (ref 70–99)
Glucose-Capillary: 109 mg/dL — ABNORMAL HIGH (ref 70–99)

## 2013-07-30 LAB — TYPE AND SCREEN
ABO/RH(D): A POS
Antibody Screen: NEGATIVE
Unit division: 0
Unit division: 0

## 2013-07-30 LAB — BASIC METABOLIC PANEL
BUN: 24 mg/dL — AB (ref 6–23)
CO2: 22 meq/L (ref 19–32)
CREATININE: 0.99 mg/dL (ref 0.50–1.10)
Calcium: 9.3 mg/dL (ref 8.4–10.5)
Chloride: 99 mEq/L (ref 96–112)
GFR calc Af Amer: 60 mL/min — ABNORMAL LOW (ref >90)
GFR calc non Af Amer: 52 mL/min — ABNORMAL LOW (ref >90)
Glucose, Bld: 109 mg/dL — ABNORMAL HIGH (ref 70–99)
Potassium: 4.5 mEq/L (ref 3.7–5.3)
Sodium: 134 mEq/L — ABNORMAL LOW (ref 137–147)

## 2013-07-30 NOTE — Progress Notes (Addendum)
Clinical Social Work Department CLINICAL SOCIAL WORK PLACEMENT NOTE 07/30/2013  Patient:  BRIENNA, BASS  Account Number:  [id number] Admit date:  07/26/2013  Clinical Social Worker:  Megan Salon  Date/time:  07/30/2013 11:08 AM  Clinical Social Work is seeking post-discharge placement for this patient at the following level of care:   [place]   (*CSW will update this form in Epic as items are completed)   07/30/2013  Patient/family provided with [place] Department of Clinical Social Work's list of facilities offering this level of care within the geographic area requested by the patient (or if unable, by the patient's family).  07/30/2013  Patient/family informed of their freedom to choose among providers that offer the needed level of care, that participate in Medicare, Medicaid or managed care program needed by the patient, have an available bed and are willing to accept the patient.  07/30/2013  Patient/family informed of [facility]' ownership interest in [facility], as well as of the fact that they are under no obligation to receive care at this facility.  PASARR submitted to EDS on 07/30/2013 PASARR number received from EDS on 07/30/2013  FL2 transmitted to all facilities in geographic area requested by pt/family on  07/30/2013 FL2 transmitted to all facilities within larger geographic area on   Patient informed that his/her managed care company has contracts with or will negotiate with  certain facilities, including the following:     Patient/family informed of bed offers received:  07/31/2013 Patient chooses bed at [facility] Physician recommends and patient chooses bed at    Patient to be transferred to [facility] on 08/01/2013  Patient to be transferred to facility by [facility]  The following physician request were entered in Epic:   Additional Comments:  Jeanette Caprice, MSW, [place]

## 2013-07-30 NOTE — Progress Notes (Signed)
CARDIAC REHAB PHASE I   PRE:  Rate/Rhythm: 93 afib  BP:  Supine:   Sitting: 111/82  Standing:    SaO2: 100% 2L  MODE:  Ambulation: 72 ft   POST:  Rate/Rhythm: 121 afib  BP:  Supine:   Sitting: 110/47  Standing:    SaO2: 89-90% 2L 1132-1210 Pt very deconditioned. Walked 72 ft on 2L with gait belt use, rolling walker and asst x 2. Encouraged pt to stand upright. Leaned backward when first getting up. Was in atrial fib prior to walk but rate did not elevate too much with activity. To recliner with encouragement. Told pt to try to stay OOB as much as she can. Set up lunch. Son in room.   Graylon Good, RN BSN  07/30/2013 12:05 PM

## 2013-07-30 NOTE — Progress Notes (Addendum)
      [address]       [place] [address]             [phone]      3 Days Post-Op Procedure(s) (LRB): CORONARY ARTERY BYPASS GRAFTING (CABG) POSS. MITRAL REPAIR (N/A)  Subjective:  Ms. Madewell states she is very sore.  She states she didn't expect this to be that bad.  She is agreeable to go to SNF at discharge  Objective: Vital signs in last 24 hours: Temp:  [97.6 F (36.4 C)-98 F (36.7 C)] 97.9 F (36.6 C) (06/03 0510) Pulse Rate:  [60-86] 60 (06/03 0510) Cardiac Rhythm:  [-] Normal sinus rhythm (06/03 0740) Resp:  [15-27] 18 (06/03 0510) BP: (102-138)/(53-107) 113/53 mmHg (06/03 0510) SpO2:  [91 %-99 %] 97 % (06/03 0510) Weight:  [211 lb 13.8 oz (96.1 kg)] 211 lb 13.8 oz (96.1 kg) (06/03 0423)  Intake/Output from previous day: 06/02 0701 - 06/03 0700 In: 570 [I.V.:570] Out: 475 [Urine:475]  General appearance: alert, cooperative and no distress Heart: regular rate and rhythm Lungs: clear to auscultation bilaterally Abdomen: soft, non-tender; bowel sounds normal; no masses,  no organomegaly Extremities: edema 1+ Wound: clean and dry  Lab Results:  Recent Labs  07/29/13 0400 07/30/13 0425  WBC 11.5* 10.2  HGB 9.3* 8.9*  HCT 27.3* 26.8*  PLT 107* 106*   BMET:  Recent Labs  07/29/13 0400 07/30/13 0425  NA 135* 134*  K 4.4 4.5  CL 100 99  CO2 22 22  GLUCOSE 105* 109*  BUN 16 24*  CREATININE 0.77 0.99  CALCIUM 9.4 9.3    PT/INR:  Recent Labs  07/27/13 1340  LABPROT 17.1*  INR 1.43   ABG    Component Value Date/Time   PHART 7.340* 07/27/2013 1941   HCO3 21.3 07/27/2013 1941   TCO2 21 07/27/2013 1946   ACIDBASEDEF 4.0* 07/27/2013 1941   O2SAT 91.0 07/27/2013 1941   CBG (last 3)   Recent Labs  07/29/13 1639 07/29/13 2054 07/30/13 0627  GLUCAP 107* 109* 122*    Assessment/Plan: S/P Procedure(s) (LRB): CORONARY ARTERY BYPASS GRAFTING (CABG) POSS. MITRAL REPAIR (N/A)  1. CV- Previous A. Fib, currently maintaining  NSR- continue Amiodarone, Lopressor 2. Pulm- continued atelectasis, wean oxygen as tolerated, will order flutter valve 3. Renal- creatinine WNL, mildly hypervolemic weight is up 9 lbs, continue Lasix 4. Expected Acute Blood Loss Anemia- stable at 8.9 5. DM- CBGs controlled with home Metformin 6. Deconditioning- needs SNF, will place social work consult 7. Dispo- patient progressing, maintaining NSR, will d/c EPW today, ambulate, start SNF placement   LOS: 4 days    Ellwood Handler 07/30/2013  Patient seen and examined, agree with above She is progressing as expected

## 2013-07-30 NOTE — Progress Notes (Signed)
Clinical Social Work Department BRIEF PSYCHOSOCIAL ASSESSMENT 07/30/2013  Patient:  Molly [name], Molly [name]     Account Number:  [id number]     Admit date:  07/26/2013  Clinical Social Worker:  Megan Salon  Date/Time:  07/30/2013 10:59 AM  Referred by:  Physician  Date Referred:  07/30/2013 Referred for  SNF Placement   Other Referral:   Interview type:  Other - See comment Other interview type:   CSW spoke to patient and patient's son by bedside    PSYCHOSOCIAL DATA Living Status:  ALONE Admitted from facility:   Level of care:   Primary support name:  Romero Belling Primary support relationship to patient:  CHILD, ADULT Degree of support available:   good    CURRENT CONCERNS Current Concerns  Post-Acute Placement   Other Concerns:    SOCIAL WORK ASSESSMENT / PLAN Clinical Social Worker received referral for SNF placement at d/c. CSW introduced self and explained reason for visit. Patient had visitor by bedside.  CSW explained SNF process and provided SNF packet to patient and family. Patient reported she is agreeable for SNF placement and prefers [facility] or [facility]. CSW encouraged patient and patient's son to think about additional SNF options pending availability of preferred facility. CSW will complete FL2 for MD's signature and will update patient and family when bed offers are received.   Assessment/plan status:  Psychosocial Support/Ongoing Assessment of Needs Other assessment/ plan:   Information/referral to community resources:   SNF information/ Advanced Directives packet    PATIENT'S/FAMILY'S RESPONSE TO PLAN OF CARE: Patient states she would like to go to SNF in [facility] and prefers [facility]. Patient and patient's son requested an Advanced Directive packet. CSW provided packet.        Jeanette Caprice, MSW, [place]

## 2013-07-30 NOTE — Progress Notes (Signed)
CSW provided bed offers to patient and patient's son at bedside. CSW left voicemail for [facility] (patient's preference) and is awaiting a call back. FL2 on chart for MD signature.  Jeanette Caprice, MSW, [place]

## 2013-07-30 NOTE — Progress Notes (Signed)
07/30/2013 1200 Nursing note RN noted pt. Back in Afib rate 80-105. Pt. Asymptomatic. VSS. Pt. Currently ambulating with cardiac rehab. Junie Panning Barrett PAC paged and made aware. Verbal orders received not to d/c EPW as previously ordered and continue to monitor patient rate/rhythm. Orders enacted and patient updated on plan. Will continue to monitor patient.  [place]

## 2013-07-31 LAB — GLUCOSE, CAPILLARY
GLUCOSE-CAPILLARY: 105 mg/dL — AB (ref 70–99)
GLUCOSE-CAPILLARY: 118 mg/dL — AB (ref 70–99)
Glucose-Capillary: 98 mg/dL (ref 70–99)
Glucose-Capillary: 112 mg/dL — ABNORMAL HIGH (ref 70–99)

## 2013-07-31 MED ORDER — WARFARIN - PHYSICIAN DOSING INPATIENT
Freq: Every day | Status: DC
  Administered 2013-07-31: 18:00:00

## 2013-07-31 MED ORDER — ASPIRIN 81 MG PO CHEW
81.0000 mg | CHEWABLE_TABLET | Freq: Every day | ORAL | Status: DC
  Administered 2013-08-01: 81 mg via ORAL
  Filled 2013-07-31: qty 1

## 2013-07-31 MED ORDER — WARFARIN SODIUM 2.5 MG PO TABS
2.5000 mg | ORAL_TABLET | Freq: Every day | ORAL | Status: DC
  Administered 2013-07-31: 2.5 mg via ORAL
  Filled 2013-07-31 (×2): qty 1

## 2013-07-31 NOTE — Progress Notes (Addendum)
      [address]       [place] [address]             [phone]      4 Days Post-Op Procedure(s) (LRB): CORONARY ARTERY BYPASS GRAFTING (CABG) POSS. MITRAL REPAIR (N/A)  Subjective:  Molly [name] states she just feels lousy.  She is ambulating with max assist.  No BM  Objective: Vital signs in last 24 hours: Temp:  [97.6 F (36.4 C)-98.4 F (36.9 C)] 98 F (36.7 C) (06/04 0549) Pulse Rate:  [70-107] 96 (06/04 0549) Cardiac Rhythm:  [-] Atrial fibrillation (06/03 2100) Resp:  [16-18] 18 (06/04 0549) BP: (110-126)/(56-68) 110/65 mmHg (06/04 0549) SpO2:  [94 %-100 %] 100 % (06/04 0549) Weight:  [211 lb 3.2 oz (95.8 kg)] 211 lb 3.2 oz (95.8 kg) (06/04 0549)  Intake/Output from previous day: 06/03 0701 - 06/04 0700 In: 240 [P.O.:240] Out: 2600 [Urine:2600]  General appearance: alert, cooperative and no distress Heart: irregularly irregular rhythm Lungs: diminished breath sounds bibasilar Abdomen: soft, non-tender; bowel sounds normal; no masses,  no organomegaly Extremities: edema trace Wound: clean and dry, ecchymosis in RLE  Lab Results:  Recent Labs  07/29/13 0400 07/30/13 0425  WBC 11.5* 10.2  HGB 9.3* 8.9*  HCT 27.3* 26.8*  PLT 107* 106*   BMET:  Recent Labs  07/29/13 0400 07/30/13 0425  NA 135* 134*  K 4.4 4.5  CL 100 99  CO2 22 22  GLUCOSE 105* 109*  BUN 16 24*  CREATININE 0.77 0.99  CALCIUM 9.4 9.3    PT/INR: No results found for this basename: LABPROT, INR,  in the last 72 hours ABG    Component Value Date/Time   PHART 7.340* 07/27/2013 1941   HCO3 21.3 07/27/2013 1941   TCO2 21 07/27/2013 1946   ACIDBASEDEF 4.0* 07/27/2013 1941   O2SAT 91.0 07/27/2013 1941   CBG (last 3)   Recent Labs  07/30/13 1604 07/30/13 2118 07/31/13 0554  GLUCAP 100* 100* 105*    Assessment/Plan: S/P Procedure(s) (LRB): CORONARY ARTERY BYPASS GRAFTING (CABG) POSS. MITRAL REPAIR (N/A)  1. CV- A. Fib, rate in the 90s'-100s- continue  Amiodarone and Lopressor at 25 mg BID 2. Pulm- wean oxygen as tolerated, continued atelectasis on CXR 3. Renal- remains hypervolemic, continue Lasix 4. DM- CBGs controlled 5. Deconditioning- needs SNF, requires a lot of assistance with ambulation 6. Dispo- patient not feeling well today, likely due to A. Fib if doesn't convert will need to consider anticoagulation,   LOS: 5 days    Ellwood Handler 07/31/2013  Still in a fib. Rate controlled- continue amiodarone and lopressor. She has been in it long enough that I think we should treat her with warfarin. Will start 2.5 mg daily- can fine tune as an outpatient if she is able to go to a SNF tomorrow or Saturday  Did better with PT today

## 2013-07-31 NOTE — Progress Notes (Signed)
Patient ambulated approximately 80 ft with rolling walker and both RN and NT.  Tolerated fairly well, slow and steady.  HR did rise to the 120s during walk.  Patient left in room with family member and call bell within reach.  Will continue to monitor.  Randell Patient

## 2013-07-31 NOTE — Progress Notes (Signed)
Physical Therapy Treatment Patient Details Name: Molly [name] MRN: [id number] DOB: [birth year] Today's Date: Aug 22, 2013    History of Present Illness Pt adm with NSTEMI and underwent CABG x 4 on 5/31. PMH-HTN, DM    PT Comments    Pt making steady progress.  Follow Up Recommendations  SNF     Equipment Recommendations  Rolling walker with 5" wheels    Recommendations for Other Services       Precautions / Restrictions Precautions Precautions: Sternal;Fall    Mobility  Bed Mobility                  Transfers Overall transfer level: Needs assistance Equipment used: Rolling walker (2 wheeled) Transfers: Sit to/from Stand Sit to Stand: Mod assist         General transfer comment: Verbal/tactile cues for hand placement. Assist to bring hips up.  Ambulation/Gait Ambulation/Gait assistance: Min assist Ambulation Distance (Feet): 150 Feet Assistive device: Rolling walker (2 wheeled) Gait Pattern/deviations: Step-through pattern;Decreased step length - right;Decreased step length - left;Trunk flexed Gait velocity: decr Gait velocity interpretation: Below normal speed for age/gender General Gait Details: Verbal cues to stand more erect   Stairs            Wheelchair Mobility    Modified Rankin (Stroke Patients Only)       Balance           Standing balance support: Bilateral upper extremity supported Standing balance-Leahy Scale: Poor Standing balance comment: Stands with support of walker and supervision                    Cognition Arousal/Alertness: Awake/alert Behavior During Therapy: WFL for tasks assessed/performed Overall Cognitive Status: Within Functional Limits for tasks assessed                      Exercises      General Comments        Pertinent Vitals/Pain VSS    Home Living                      Prior Function            PT Goals (current goals can now be found in the care plan section)  Progress towards PT goals: Progressing toward goals    Frequency  Min 2X/week    PT Plan Frequency needs to be updated    Co-evaluation             End of Session Equipment Utilized During Treatment: Gait belt;Oxygen Activity Tolerance: Patient tolerated treatment well Patient left: in chair;with call bell/phone within reach;with family/visitor present     Time: 1540-0867 PT Time Calculation (min): 19 min  Charges:  $Gait Training: 8-22 mins                    G Codes:      [name] Chalmer Zheng 2013-08-22, 4:30 PM  [employer]

## 2013-07-31 NOTE — Progress Notes (Addendum)
CSW Armed forces technical officer) spoke with pt about bed offers. She asked that CSW speak with her son. CSW has spoken to pt son multiple times today about bed offers. Pt son has decided they would like [facility]. CSW called facility admissions and left voicemail of bed acceptance and possible dc tomorrow.  ADDENDUM: CSW received call back from facility. They are requesting new PT note submitted tomorrow since no note available today since evaluation on 07/29/2013.  [place], [employer]

## 2013-07-31 NOTE — Progress Notes (Signed)
CARDIAC REHAB PHASE I   PRE:  Rate/Rhythm: 110 afib  BP:  Supine:   Sitting: 105/88  Standing:    SaO2: 99% 2L  MODE:  Ambulation: 150 ft   POST:  Rate/Rhythm: 130s afib  BP:  Supine:   Sitting: 121/77  Standing:    SaO2: 97% 2L  Took a while to register [id number] Pt walked 150 ft on 2L oxygen with gait belt use, rolling walker and asst x 2 with fairly steady gait. Encouraged to stand upright. Tired easily but able to walk distance with encouragement. To BSC with call bell for BM. Heart rate to 130s with activity. RN notified of HR and that pt on BSC.   Graylon Good, RN BSN  07/31/2013 9:20 AM

## 2013-08-01 ENCOUNTER — Encounter: Payer: Self-pay | Admitting: Internal Medicine

## 2013-08-01 DIAGNOSIS — I10 Essential (primary) hypertension: Secondary | ICD-10-CM | POA: Diagnosis not present

## 2013-08-01 DIAGNOSIS — I059 Rheumatic mitral valve disease, unspecified: Secondary | ICD-10-CM | POA: Diagnosis present

## 2013-08-01 DIAGNOSIS — K560 Paralytic ileus: Secondary | ICD-10-CM | POA: Diagnosis not present

## 2013-08-01 DIAGNOSIS — I129 Hypertensive chronic kidney disease with stage 1 through stage 4 chronic kidney disease, or unspecified chronic kidney disease: Secondary | ICD-10-CM | POA: Diagnosis present

## 2013-08-01 DIAGNOSIS — I110 Hypertensive heart disease with heart failure: Secondary | ICD-10-CM | POA: Diagnosis not present

## 2013-08-01 DIAGNOSIS — Z951 Presence of aortocoronary bypass graft: Secondary | ICD-10-CM | POA: Diagnosis not present

## 2013-08-01 DIAGNOSIS — R0789 Other chest pain: Secondary | ICD-10-CM | POA: Diagnosis not present

## 2013-08-01 DIAGNOSIS — N19 Unspecified kidney failure: Secondary | ICD-10-CM | POA: Diagnosis not present

## 2013-08-01 DIAGNOSIS — N179 Acute kidney failure, unspecified: Secondary | ICD-10-CM | POA: Diagnosis not present

## 2013-08-01 DIAGNOSIS — I509 Heart failure, unspecified: Secondary | ICD-10-CM | POA: Diagnosis not present

## 2013-08-01 DIAGNOSIS — Z515 Encounter for palliative care: Secondary | ICD-10-CM | POA: Diagnosis not present

## 2013-08-01 DIAGNOSIS — N390 Urinary tract infection, site not specified: Secondary | ICD-10-CM | POA: Diagnosis not present

## 2013-08-01 DIAGNOSIS — E785 Hyperlipidemia, unspecified: Secondary | ICD-10-CM | POA: Diagnosis not present

## 2013-08-01 DIAGNOSIS — D638 Anemia in other chronic diseases classified elsewhere: Secondary | ICD-10-CM | POA: Diagnosis present

## 2013-08-01 DIAGNOSIS — Z48812 Encounter for surgical aftercare following surgery on the circulatory system: Secondary | ICD-10-CM | POA: Diagnosis not present

## 2013-08-01 DIAGNOSIS — E119 Type 2 diabetes mellitus without complications: Secondary | ICD-10-CM | POA: Diagnosis present

## 2013-08-01 DIAGNOSIS — K929 Disease of digestive system, unspecified: Secondary | ICD-10-CM | POA: Diagnosis not present

## 2013-08-01 DIAGNOSIS — Z0389 Encounter for observation for other suspected diseases and conditions ruled out: Secondary | ICD-10-CM | POA: Diagnosis not present

## 2013-08-01 DIAGNOSIS — R059 Cough, unspecified: Secondary | ICD-10-CM | POA: Diagnosis not present

## 2013-08-01 DIAGNOSIS — N170 Acute kidney failure with tubular necrosis: Secondary | ICD-10-CM | POA: Diagnosis present

## 2013-08-01 DIAGNOSIS — Z5189 Encounter for other specified aftercare: Secondary | ICD-10-CM | POA: Diagnosis not present

## 2013-08-01 DIAGNOSIS — E876 Hypokalemia: Secondary | ICD-10-CM | POA: Diagnosis not present

## 2013-08-01 DIAGNOSIS — R072 Precordial pain: Secondary | ICD-10-CM | POA: Diagnosis not present

## 2013-08-01 DIAGNOSIS — I5042 Chronic combined systolic (congestive) and diastolic (congestive) heart failure: Secondary | ICD-10-CM | POA: Diagnosis not present

## 2013-08-01 DIAGNOSIS — I2510 Atherosclerotic heart disease of native coronary artery without angina pectoris: Secondary | ICD-10-CM | POA: Diagnosis not present

## 2013-08-01 DIAGNOSIS — I4891 Unspecified atrial fibrillation: Secondary | ICD-10-CM | POA: Diagnosis present

## 2013-08-01 DIAGNOSIS — I2589 Other forms of chronic ischemic heart disease: Secondary | ICD-10-CM | POA: Diagnosis present

## 2013-08-01 DIAGNOSIS — I214 Non-ST elevation (NSTEMI) myocardial infarction: Secondary | ICD-10-CM | POA: Diagnosis not present

## 2013-08-01 DIAGNOSIS — Z66 Do not resuscitate: Secondary | ICD-10-CM | POA: Diagnosis not present

## 2013-08-01 DIAGNOSIS — G9349 Other encephalopathy: Secondary | ICD-10-CM | POA: Diagnosis present

## 2013-08-01 DIAGNOSIS — Z7901 Long term (current) use of anticoagulants: Secondary | ICD-10-CM | POA: Diagnosis not present

## 2013-08-01 DIAGNOSIS — R110 Nausea: Secondary | ICD-10-CM | POA: Diagnosis not present

## 2013-08-01 DIAGNOSIS — N184 Chronic kidney disease, stage 4 (severe): Secondary | ICD-10-CM | POA: Diagnosis present

## 2013-08-01 DIAGNOSIS — R0602 Shortness of breath: Secondary | ICD-10-CM | POA: Diagnosis not present

## 2013-08-01 DIAGNOSIS — E7800 Pure hypercholesterolemia, unspecified: Secondary | ICD-10-CM | POA: Diagnosis present

## 2013-08-01 DIAGNOSIS — I25810 Atherosclerosis of coronary artery bypass graft(s) without angina pectoris: Secondary | ICD-10-CM | POA: Diagnosis not present

## 2013-08-01 DIAGNOSIS — R5381 Other malaise: Secondary | ICD-10-CM | POA: Diagnosis not present

## 2013-08-01 DIAGNOSIS — I498 Other specified cardiac arrhythmias: Secondary | ICD-10-CM | POA: Diagnosis present

## 2013-08-01 DIAGNOSIS — K5669 Other intestinal obstruction: Secondary | ICD-10-CM | POA: Diagnosis not present

## 2013-08-01 DIAGNOSIS — I252 Old myocardial infarction: Secondary | ICD-10-CM | POA: Diagnosis not present

## 2013-08-01 DIAGNOSIS — E871 Hypo-osmolality and hyponatremia: Secondary | ICD-10-CM | POA: Diagnosis present

## 2013-08-01 LAB — GLUCOSE, CAPILLARY
GLUCOSE-CAPILLARY: 114 mg/dL — AB (ref 70–99)
Glucose-Capillary: 115 mg/dL — ABNORMAL HIGH (ref 70–99)

## 2013-08-01 LAB — PROTIME-INR
INR: 1.14 (ref 0.00–1.49)
Prothrombin Time: 14.4 seconds (ref 11.6–15.2)

## 2013-08-01 MED ORDER — METOPROLOL TARTRATE 25 MG PO TABS: 25.0000 mg | ORAL_TABLET | Freq: Two times a day (BID) | ORAL | Status: DC

## 2013-08-01 MED ORDER — WARFARIN SODIUM 2.5 MG PO TABS: 2.5000 mg | ORAL_TABLET | Freq: Every day | ORAL | Status: DC

## 2013-08-01 MED ORDER — TRAMADOL HCL 50 MG PO TABS: 50.0000 mg | ORAL_TABLET | Freq: Four times a day (QID) | ORAL | Status: DC | PRN

## 2013-08-01 MED ORDER — AMIODARONE HCL 200 MG PO TABS: 200.0000 mg | ORAL_TABLET | Freq: Two times a day (BID) | ORAL | Status: DC

## 2013-08-01 MED ORDER — POTASSIUM CHLORIDE CRYS ER 20 MEQ PO TBCR: 20.0000 meq | EXTENDED_RELEASE_TABLET | Freq: Every day | ORAL | Status: DC

## 2013-08-01 MED ORDER — LISINOPRIL 2.5 MG PO TABS: 2.5000 mg | ORAL_TABLET | Freq: Every day | ORAL | Status: DC

## 2013-08-01 MED ORDER — ASPIRIN 81 MG PO CHEW: 81.0000 mg | CHEWABLE_TABLET | Freq: Every day | ORAL | Status: AC

## 2013-08-01 MED ORDER — FUROSEMIDE 40 MG PO TABS: 40.0000 mg | ORAL_TABLET | Freq: Every day | ORAL | Status: DC

## 2013-08-01 NOTE — Progress Notes (Signed)
[id number] Cardiac Rehab Completed discharge education with pt and son. They voice understanding. Pt in interested in [facility] in [place], will send them her contact information. Deon Pilling, RN 08/01/2013 1:08 PM

## 2013-08-01 NOTE — Progress Notes (Addendum)
Assessment unchanged. Discussed D/C instructions with pt and son including f/u appointments and new medications. Verbalized understanding. RX placed in packet for SNF. Family informed that packet is to be given to facility. Verbalized understanding. IV and tele removed. CT sutures removed. 1/2" steri strips and Benzoin applied. Pt instructed that strips will fall off and to not pull them off. Pt verbalized understanding. Pt left with belongings accompanied by RN. Report called to facility.

## 2013-08-01 NOTE — Discharge Instructions (Signed)
Activity: 1.May walk up steps                2.No lifting more than ten pounds for four weeks.                 3.No driving for four weeks.                4.Stop any activity that causes chest pain, shortness of breath, dizziness, sweating or excessive weakness.                5.Avoid straining.                6.Continue with your breathing exercises daily.  Diet: Diabetic diet and Low fat, salt diet  Wound Care: May shower.  Clean wounds with mild soap and water daily. Contact the office at [phone] if any problems arise.  Coronary Artery Bypass Grafting, Care After Refer to this sheet in the next few weeks. These instructions provide you with information on caring for yourself after your procedure. Your health care provider may also give you more specific instructions. Your treatment has been planned according to current medical practices, but problems sometimes occur. Call your health care provider if you have any problems or questions after your procedure. WHAT TO EXPECT AFTER THE PROCEDURE Recovery from surgery will be different for everyone. Some people feel well after 3 or 4 weeks, while for others it takes longer. After your procedure, it is typical to have the following:  Nausea and a lack of appetite.   Constipation.  Weakness and fatigue.   Depression or irritability.   Pain or discomfort at your incision site. HOME CARE INSTRUCTIONS  Only take over-the-counter or prescription medicines as directed by your health care provider. Take all medicines exactly as directed. Do not stop taking medicines or start any new medicines without first checking with your health care provider.   Take your pulse as directed by your health care provider.  Perform deep breathing as directed by your health care provider. If you were given a device called an incentive spirometer, use it to practice deep breathing several times a day. Support your chest with a pillow or your arms when you  take deep breaths or cough.  Keep incision areas clean, dry, and protected. Remove or change any bandages (dressings) only as directed by your health care provider. You may have skin adhesive strips over the incision areas. Do not take the strips off. They will fall off on their own.  Check incision areas daily for any swelling, redness, or drainage.  If incisions were made in your legs, do the following:  Avoid crossing your legs.   Avoid sitting for long periods of time. Change positions every 30 minutes.   Elevate your legs when you are sitting.   Wear compression stockings as directed by your health care provider. These stockings help keep blood clots from forming in your legs.  Take showers once your health care provider approves. Until then, only take sponge baths. Pat incisions dry. Do not rub incisions with a washcloth or towel. Do not take tub baths or go swimming until your health care provider approves.  Eat foods that are high in fiber, such as raw fruits and vegetables, whole grains, beans, and nuts. Meats should be lean cut. Avoid canned, processed, and fried foods.  Drink enough fluids to keep your urine clear or pale yellow.  Weigh yourself every day. This helps identify if you are retaining  fluid that may make your heart and lungs work harder.   Rest and limit activity as directed by your health care provider. You may be instructed to:  Stop any activity at once if you have chest pain, shortness of breath, irregular heartbeats, or dizziness. Get help right away if you have any of these symptoms.  Move around frequently for short periods or take short walks as directed by your health care provider. Increase your activities gradually. You may need physical therapy or cardiac rehabilitation to help strengthen your muscles and build your endurance.  Avoid lifting, pushing, or pulling anything heavier than 10 lb (4.5 kg) for at least 6 weeks after surgery.  Do not  drive until your health care provider approves.  Ask your health care provider when you may return to work and resume sexual activity.  Follow up with your health care provider as directed.  SEEK MEDICAL CARE IF:  You have swelling, redness, increasing pain, or drainage at the site of an incision.   You develop a fever.   You have swelling in your ankles or legs.   You have pain in your legs.   You have weight gain of 2 or more pounds a day.  You are nauseous or vomit.  You have diarrhea. SEEK IMMEDIATE MEDICAL CARE IF:  You have chest pain that goes to your jaw or arms.  You have shortness of breath.   You have a fast or irregular heartbeat.   You notice a "clicking" in your breastbone (sternum) when you move.   You have numbness or weakness in your arms or legs.  You feel dizzy or lightheaded.  MAKE SURE YOU:  Understand these instructions.  Will watch your condition.  Will get help right away if you are not doing well or get worse. Document Released: 09/02/2004 Document Revised: 10/16/2012 Document Reviewed: 07/23/2012 [facility] Patient Information 2014 [place].  Warfarin: What You Need to Know Warfarin is an anticoagulant. Anticoagulants help prevent the formation of blood clots. They also help stop the growth of blood clots. Warfarin is sometimes referred to as a "blood thinner."  Normally, when body tissues are cut or damaged, the blood clots in order to prevent blood loss. Sometimes clots form inside your blood vessels and obstruct the flow of blood through your circulatory system (thrombosis). These clots may travel through your bloodstream and become lodged in smaller blood vessels in your brain, which can cause a stroke, or your lungs (pulmonary embolism). WHO SHOULD USE WARFARIN? Warfarin is prescribed for people at risk of developing harmful blood clots: People with surgically implanted mechanical heart valves, irregular heart rhythms  called atrial fibrillation, and certain clotting disorders. People who have developed harmful blood clotting in the past, including those who have had a stroke or a pulmonary embolism, or thrombosis in their legs (deep vein thrombosis [DVT]). People with an existing blood clot such as a pulmonary embolism. WARFARIN DOSING Warfarin tablets come in different strengths. Each tablet strength is a different color, with the amount of warfarin (in milligrams) clearly printed on the tablet. If the color of your tablet is different than usual when you receive a new prescription, report it immediately to your pharmacist or health care provider. WARFARIN MONITORING The goal of warfarin therapy is to lessen the clotting tendency of blood but not to prevent clotting completely. Your health care provider will monitor the anticoagulation effect of warfarin closely and adjust your dose as needed. For your safety, blood tests called prothrombin  time (PT) or international normalized ratio (INR) are used to measure the effects of warfarin. Both of these tests can be done with a finger stick or a blood draw. The longer it takes the blood to clot, the higher the PT or INR. Your health care provider will inform you of your "target" PT or INR range. If, at any time, your PT or INR is above the target range, there is a risk of bleeding. If your PT or INR is below the target range, there is a risk of clotting. Whether you are started on warfarin while you are in the hospital, or in your health care provider's office, you will need to have your PT or INR checked within one week of starting the medicine. Initially, some people are asked to have their PT or INR checked as much as twice a week. Once you are on a stable maintenance dose, the PT or INR is checked less often, usually once every 2 to 4 weeks. The warfarin dose may be adjusted if the PT or INR is not within the target range. It is important to keep all laboratory and health  care provider follow-up appointments.  WHAT ARE THE SIDE EFFECTS OF WARFARIN? Too much warfarin can cause bleeding (hemorrhage) from any part of the body. This may include bleeding from the gums, blood in the urine, bloody or dark stools, a nosebleed that is not easily stopped, coughing up blood, or vomiting blood. Too little warfarin can increase the risk of blood clots. Too little or too much warfarin can also increase the risk of a stroke. Warfarin use may cause a skin rash or irritation, an unusual fever, continual nausea or stomach upset, or severe pain in your joints or back. SPECIAL PRECAUTIONS WHILE TAKING WARFARIN Warfarin should be taken exactly as directed: Take your medicine at the same time every day. If you forget to take your dose, you can take it if it is within 6 hours of when it was due. Do not change the dose of warfarin on your own to make up for missed or extra doses. If you miss more than 2 doses in a row, you should contact your health care provider for advice. Avoid situations that cause bleeding. You may have a tendency to bleed more easily than usual while taking warfarin. The following actions can limit bleeding: Using a softer toothbrush. Flossing with waxed floss rather than unwaxed floss. Shaving with an Copy rather than a blade. Limiting the use of sharp objects. Avoiding potentially harmful activities such as contact sports. Warfarin and Pregnancy or Breastfeeding Warfarin is not advised during the first trimester of pregnancy due to an increased risk of birth defects. In certain situations, a woman may take warfarin after her first trimester of pregnancy. A woman who becomes pregnant or plans to become pregnant while taking warfarin should notify her health care provider immediately. Although warfarin does not pass into breast milk, a woman who wishes to breastfeed while taking warfarin should also consult with her health care provider. Alcohol, Smoking,  and Illicit Drug Use Alcohol affects how warfarin works in the body. It is best to avoid alcoholic drinks or consume very small amounts while taking warfarin. In general, alcohol intake should be limited to 1 oz (30 mL) of liquor, 6 oz (180 mL) of wine, or 12 oz (360 mL) of beer each day. Notify your health care provider if you change your alcohol intake. Smoking affects how warfarin works. It is best to  avoid smoking while taking warfarin. Notify your health care provider if you change your smoking habits. It is best to avoid all illicit drugs while taking warfarin since there are few studies that show how warfarin interacts with these drugs. Other Medicines and Dietary Supplements Many prescription and over-the-counter medicines can interfere with warfarin. Be sure all of your health care providers know you are taking warfarin. Notify your health care provider who prescribed warfarin for you before starting or stopping any new medicines, including over-the-counter vitamins, dietary supplements, and pain medicines. Your warfarin dose may need to be adjusted. Some common over-the-counter medicines that may increase the risk of bleeding while taking warfarin include:  Acetaminophen. Aspirin. Nonsteroidal anti-inflammatory medicines such as ibuprofen or naproxen. Vitamin E. Dietary Considerations  Foods that have moderate or high amounts of vitamin K can interfere with warfarin. Avoid major changes in your diet or notify your health care provider before changing your diet. Eat a consistent amount of foods that have moderate or high amounts of vitamin K.Eating less foods containing vitamin K can increase the risk of bleeding. Eating more foods containing vitamin K can increase the risk of blood clots. Additional questions about dietary considerations can be discussed with a dietitian. The serving size for foods containing moderate or high amounts of vitamin K are  cup cooked (120 mL or noted gram weight)  or 1 cup raw (240 mL or noted gram weight), unless otherwise noted. These foods include: Proteins Beef liver, 3.5 oz (100 g). Pork liver, 3.5 oz (100 g). Legumes Soybean oil. Soybeans. Garbanzo beans. Green peas. Black-eyed peas. Leafy green vegetables Kale. Spinach. Nettle greens. Swiss chard. Watercress. Endive. Parsley, 1 tbsp (4 g). Turnip greens. Collard greens. Seaweed, limit 2 sheets. Beet greens. Dandelion greens. Mustard greens. Green Lead and Romaine lettuce. Cruciferous vegetables Broccoli. Cabbage (green or [place]). Brussels sprouts. Cauliflower. Asparagus. Miscellaneous Onions, green onions, or spring onions. Green tea made with  oz (14 g) or more of dried tea. Herbal teas containing coumarin. Spinach noodles. Okra. Prunes. Angie Fava. CALL YOUR CLINIC OR HEALTH CARE PROVIDER IF YOU: Plan to have any surgery or procedure. Feel sick, especially if you have diarrhea or vomiting. Experience or anticipate any major changes in your diet. Start or stop a prescription or over-the-counter medicine. Become, plan to become, or think you may be pregnant. Are having heavier than usual menstrual periods. Have had a fall, accident, or any symptoms of bleeding or unusual bruising. An unusual fever. CALL 911 IN THE [place] OR GO TO THE EMERGENCY DEPARTMENT IF YOU:  Think you may be having an allergic reaction to warfarin. The signs of an allergic reaction could include itching, rash, hives, swelling, chest tightness, or trouble breathing. See signs of blood in your urine. The signs could include reddish, pinkish, or tea-colored urine. See signs of blood in your stools. The signs could include bright red or black stools. Vomit or cough up blood. In these instances, the blood could have either a bright red or a "coffee-grounds" appearance. Have bleeding that will not stop after applying pressure for 30 minutes such as cuts, nosebleeds, other injuries. Have severe pain in  your joints or back. Have a new and severe headache. Have sudden weakness or numbness of your face, arm, or leg, especially on one side of your body. Have sudden confusion or trouble understanding. Have sudden trouble seeing in one or both eyes. Have sudden trouble walking, dizziness, loss of balance, or coordination. Have aphasia. Document Released: 02/13/2005 Document Revised:  11/08/2011 Document Reviewed: 08/09/2012 [employer] Patient Information 2014 [place], [place].

## 2013-08-01 NOTE — Progress Notes (Signed)
      [address]       [place] [address]             [phone]      5 Days Post-Op Procedure(s) (LRB): CORONARY ARTERY BYPASS GRAFTING (CABG) POSS. MITRAL REPAIR (N/A)  Subjective:  Ms. Kosta states she continues to feel weak and worn out.  She still is amazed at how much this surgery has taken out of her.   I explained that what she is experiencing is normal and she will notice a difference in her strength as the weeks pass.    Objective: Vital signs in last 24 hours: Temp:  [97.5 F (36.4 C)-98.2 F (36.8 C)] 98.2 F (36.8 C) (06/05 0332) Pulse Rate:  [96-104] 104 (06/05 0332) Cardiac Rhythm:  [-] Atrial fibrillation (06/04 2045) Resp:  [18-19] 18 (06/05 0332) BP: (92-119)/(54-67) 119/63 mmHg (06/05 0332) SpO2:  [98 %-100 %] 98 % (06/05 0332) Weight:  [208 lb 4.8 oz (94.484 kg)] 208 lb 4.8 oz (94.484 kg) (06/05 0332)  Intake/Output from previous day: 06/04 0701 - 06/05 0700 In: -  Out: 301 [Urine:300; Stool:1]  General appearance: alert, cooperative and no distress Heart: irregularly irregular rhythm Lungs: clear to auscultation bilaterally Abdomen: soft, non-tender; bowel sounds normal; no masses,  no organomegaly Extremities: edema trace Wound: clean and dry  Lab Results:  Recent Labs  07/30/13 0425  WBC 10.2  HGB 8.9*  HCT 26.8*  PLT 106*   BMET:  Recent Labs  07/30/13 0425  NA 134*  K 4.5  CL 99  CO2 22  GLUCOSE 109*  BUN 24*  CREATININE 0.99  CALCIUM 9.3    PT/INR:  Recent Labs  08/01/13 0445  LABPROT 14.4  INR 1.14   ABG    Component Value Date/Time   PHART 7.340* 07/27/2013 1941   HCO3 21.3 07/27/2013 1941   TCO2 21 07/27/2013 1946   ACIDBASEDEF 4.0* 07/27/2013 1941   O2SAT 91.0 07/27/2013 1941   CBG (last 3)   Recent Labs  07/31/13 1625 07/31/13 2159 08/01/13 0644  GLUCAP 112* 118* 115*    Assessment/Plan: S/P Procedure(s) (LRB): CORONARY ARTERY BYPASS GRAFTING (CABG) POSS. MITRAL REPAIR  (N/A)  1. CV- A. Fib, rate in the 90s- continue Lopressor, Amiodarone 2. Pulm- wean oxygen as tolerated, continue IS 3. INR 1.14, continue Coumadin at 2.5 mg daily 4. Renal- minimal hypervolemia, continue Lasix 5. DM- CBGs controlled 6. Deconditioning- SNF being arranged 7. Dispo- patient progressing as expected, will D/C EPW, continue Coumadin, to SNF when bed available    LOS: 6 days    Ellwood Handler 08/01/2013

## 2013-08-01 NOTE — Progress Notes (Signed)
EPW removed per order. Ends intact. VSS. Pt instructed of one hour bedrest. Verbalized understanding. Will continue to monitor pt cloesly. 

## 2013-08-01 NOTE — Discharge Summary (Signed)
Physician Discharge Summary       [address]       [place] [address]             [phone]    Patient ID: Molly [name] MRN: [id number] DOB/AGE: [birth year] 80 y.o.  Admit date: 07/26/2013 Discharge date: 08/01/2013  Admission Diagnoses: 1. S/p NSTEMI 2. Multivessel CAD 3. History of DM 4. History of hypertension 5. History of TIA 6. History of hyperlipidemia  Discharge Diagnoses:  1. S/p NSTEMI 2. Multivessel CAD 3. History of DM 4. History of hypertension 5. History of TIA 6. History of hyperlipidemia 7. ABL anemia 8. Thrombocytopenia  Procedure (s):  Median sternotomy, extracorporeal circulation, coronary  artery bypass grafting x4 (left internal mammary artery to left anterior  descending, sequential saphenous vein graft to obtuse marginals 1 and 2,  saphenous vein graft to posterior descending), endoscopic vein harvest  right leg by Dr. Roxan Hockey on 07/27/2013.  History of Presenting Illness: This is an 80 Caucasian female with multiple cardiac risk factors that include age, hypertension, hyperlipidemia, and type II DM. She has no prior history of CAD. She was in her usual state of health until 4 days ago. That morning, while engaging in her routine activities, she experienced the sudden onset of shortness of breath. She also had a vague sensation of heaviness in her chest, but says it wasn't a pain or pressure, but more of sensation she couldn't get a deep breath. She did notice leg swelling. She denies nausea and diaphoresis. She tried to rest but the sensation persisted.She was taken to the ED. She was in CHF and ruled in for a NSTEMI.  She had an echocardiogram which showed an EF of 25-30% and moderate MR and TR. She  underwent cardiac cath on 07/25/2013 which showed severe left main and 3 vessel disease. EF was again 25-30% with infero-apical akinesis. She has been pain free since admission. She was transferred to [facility] on 07/26/2013.  Dr. Roxan Hockey accepted her in the transfer. He discussed the need for urgent coronary artery bypass grafting surgery. Potential risks, benefits, and complications were discussed with the patient and she agreed to proceed with surgery. She underwent a CABG x 4 on 07/27/2013.   Brief Hospital Course:  The patient was extubated the evening of surgery without difficulty. She remained afebrile and hemodynamically stable. She was weaned off Dopamine drip and initially AAI paced. She did have intermittent a fib and was started on Amiodarone. Gordy Councilman, a line, chest tubes, and foley were removed early in the post operative course. Lopressor was started and titrated accordingly. She was volume over loaded and diuresed. She did have ABL anemia. Her last H and H was 8.9 and 26.8. She did not require a post op transfusion. She also had thrombocytopenia. Her last platelet count was 106,000. She was weaned off the insulin drip. Once he was tolerating a diet,Metformin was restarted as taken pre operatively. The patient's HGA1C pre op was 6. The patient was felt surgically stable for transfer from the ICU to [facility] for further convalescence on 07/29/2012. He/she continue to progress with cardiac rehab. She was ambulating on room air. She remained in a fib. She was continued on oral Amiodarone and Lopressor. She was then started on Coumadin. Last INR was 1.14. She has been tolerating a diet and has had a bowel movement. Epicardial pacing wires and chest tube sutures will be removed prior to discharge. Provided the patient remains afebrile hemodynamically stable,  she is surgically stable for discharge to a SNF when a bed is available.   Latest Vital Signs: Blood pressure 110/62, pulse 94, temperature 98.2 F (36.8 C), temperature source Oral, resp. rate 18, height 5\' 7"  (1.702 m), weight 208 lb 4.8 oz (94.484 kg), SpO2 98.00%.  Physical Exam: General appearance: alert, cooperative and no distress  Heart: irregularly  irregular rhythm  Lungs: clear to auscultation bilaterally  Abdomen: soft, non-tender; bowel sounds normal; no masses, no organomegaly  Extremities: edema trace  Wound: clean and dry   Discharge Condition:Stable   Recent laboratory studies:  Lab Results  Component Value Date   WBC 10.2 07/30/2013   HGB 8.9* 07/30/2013   HCT 26.8* 07/30/2013   MCV 94.7 07/30/2013   PLT 106* 07/30/2013   Lab Results  Component Value Date   NA 134* 07/30/2013   K 4.5 07/30/2013   CL 99 07/30/2013   CO2 22 07/30/2013   CREATININE 0.99 07/30/2013   GLUCOSE 109* 07/30/2013      Diagnostic Studies: Dg Chest 2 View  07/30/2013   CLINICAL DATA:  Postop from coronary bypass grafting. Followup atelectasis.  EXAM: CHEST  2 VIEW  COMPARISON:  07/29/2013  FINDINGS: Right jugular central venous catheter is been removed. Persistent bibasilar atelectasis is seen, left side greater than right, without significant change. Cardiomegaly is stable. No definite evidence of pulmonary edema. No pneumothorax visualized.  IMPRESSION: No significant change in bibasilar atelectasis, left side greater than right.   Electronically Signed   By: Earle Gell M.D.   On: 07/30/2013 08:09   Discharge Instructions   Discharge instructions    Complete by:  As directed   1. Please obtain vital signs at least one time daily 2.Please weigh the patient daily. If he or she continues to gain weight or develops lower extremity edema, contact the office at [phone]. 3. Ambulate patient at least three times daily and please use sternal precautions.           Discharge Medications:   Medication List         acetaminophen 500 MG tablet  Commonly known as:  TYLENOL  Take 1,000 mg by mouth every 6 (six) hours as needed for headache.     amiodarone 200 MG tablet  Commonly known as:  PACERONE  Take 1 tablet (200 mg total) by mouth every 12 (twelve) hours. For 3 days; then on 08/05/2013, take Amiodarone 200 mg by mouth daily thereafter     aspirin 81  MG chewable tablet  Chew 1 tablet (81 mg total) by mouth daily.     CRESTOR 40 MG tablet  Generic drug:  rosuvastatin  Take 40 mg by mouth daily.     furosemide 40 MG tablet  Commonly known as:  LASIX  Take 1 tablet (40 mg total) by mouth daily. For 5 days then stop.     lisinopril 2.5 MG tablet  Commonly known as:  PRINIVIL,ZESTRIL  Take 1 tablet (2.5 mg total) by mouth daily.  Start taking on:  08/03/2013     metFORMIN 500 MG 24 hr tablet  Commonly known as:  GLUCOPHAGE-XR  Take 1,000 mg by mouth 2 (two) times daily.     metoprolol tartrate 25 MG tablet  Commonly known as:  LOPRESSOR  Take 1 tablet (25 mg total) by mouth 2 (two) times daily.     potassium chloride SA 20 MEQ tablet  Commonly known as:  K-DUR,KLOR-CON  Take 1 tablet (20 mEq total) by  mouth daily. For 5 days then stop.     traMADol 50 MG tablet  Commonly known as:  ULTRAM  Take 1 tablet (50 mg total) by mouth every 6 (six) hours as needed for moderate pain.     triamterene-hydrochlorothiazide 37.5-25 MG per capsule  Commonly known as:  DYAZIDE  Take 1 capsule by mouth daily.     warfarin 2.5 MG tablet  Commonly known as:  COUMADIN  Take 1 tablet (2.5 mg total) by mouth daily at 6 PM. Or as directed.        The patient has been discharged on:   1.Beta Blocker:  Yes [ x  ]                              No   [   ]                              If No, reason:  2.Ace Inhibitor/ARB: Yes [x   ]                                     No  [ x   ]                                     If No, reason:  3.Statin:   Yes [ x  ]                  No  [   ]                  If No, reason:  4.Ecasa:  Yes  [  x ]                  No   [   ]                  If No, reason:  Follow Up Appointments: Follow-up Information   Follow up with [facility] A., MD. (Call for a follow up appointment for 2 weeks)    Specialty:  Cardiology   Contact information:   [place] [address] [phone]        Follow up with [name], MD On 09/02/2013. (PA/LAT CXR to be taken (at [facility] which is in the same building as Dr. Leonarda Salon office ) on 09/02/2013 at 9:30 am;Appointment time is at 10:30 am)    Specialty:  Cardiothoracic Surgery   Contact information:   [address] [place] [address] [phone]       Follow up with TATE,DENNY C, MD. (Call for a follow up appointment regarding further diabetes management and surveillance of HGA1C 6)    Specialty:  Internal Medicine   Contact information:   [address] [place]  [address] [phone]       Follow up with SNF. (Please draw the PT and INR (as is on Coumadin) on Monday 08/04/2013 and call or fax results to Dr. Laurelyn Sickle office)       Signed: Sharalyn Ink ZimmermanPA-C 08/01/2013, 10:59 AM

## 2013-08-01 NOTE — Progress Notes (Addendum)
CSW (Clinical Education officer, museum) prepared pt [employer] and provided to pt son who confirmed he will be providing transport. CSW notified facility of pt dc today. Pt room at facility not ready yet but facility will call CSW to inform of when pt can be discharged. CSW has notified pt, pt family, and nurse of this.   ADDENDUM 1:15pm: CSW called pt nurse and updated that pt can dc around 1:45pm. CSW also provided pt nurse with information to call report. Pt room number at facility 221A, Nurse: Maudie Mercury, Number: [phone]. Pt nurse to inform pt and pt son. CSW signing off.  [place], [place]

## 2013-08-04 LAB — PROTIME-INR
INR: 1.3
PROTHROMBIN TIME: 15.8 s — AB (ref 11.5–14.7)

## 2013-08-08 LAB — PROTIME-INR
INR: 1.6
Prothrombin Time: 18.7 secs — ABNORMAL HIGH (ref 11.5–14.7)

## 2013-08-12 LAB — PROTIME-INR
INR: 4.3 — AB
PROTHROMBIN TIME: 39.8 s — AB (ref 11.5–14.7)

## 2013-08-14 LAB — PROTIME-INR
INR: 5.5
Prothrombin Time: 48.1 secs — ABNORMAL HIGH (ref 11.5–14.7)

## 2013-08-15 DIAGNOSIS — I2510 Atherosclerotic heart disease of native coronary artery without angina pectoris: Secondary | ICD-10-CM | POA: Diagnosis not present

## 2013-08-15 DIAGNOSIS — I10 Essential (primary) hypertension: Secondary | ICD-10-CM | POA: Diagnosis not present

## 2013-08-15 DIAGNOSIS — E785 Hyperlipidemia, unspecified: Secondary | ICD-10-CM | POA: Diagnosis not present

## 2013-08-15 DIAGNOSIS — I25810 Atherosclerosis of coronary artery bypass graft(s) without angina pectoris: Secondary | ICD-10-CM | POA: Diagnosis not present

## 2013-08-15 DIAGNOSIS — R0789 Other chest pain: Secondary | ICD-10-CM | POA: Diagnosis not present

## 2013-08-15 DIAGNOSIS — I509 Heart failure, unspecified: Secondary | ICD-10-CM | POA: Diagnosis not present

## 2013-08-15 LAB — PROTIME-INR
INR: 2.5
Prothrombin Time: 26 secs — ABNORMAL HIGH (ref 11.5–14.7)

## 2013-08-16 LAB — URINALYSIS, COMPLETE
BILIRUBIN, UR: NEGATIVE
GLUCOSE, UR: NEGATIVE mg/dL (ref 0–75)
Ketone: NEGATIVE
LEUKOCYTE ESTERASE: NEGATIVE
Nitrite: NEGATIVE
PH: 6 (ref 4.5–8.0)
Protein: 30
RBC,UR: 2 /HPF (ref 0–5)
SPECIFIC GRAVITY: 1.01 (ref 1.003–1.030)
Squamous Epithelial: 4
WBC UR: 6 /HPF (ref 0–5)

## 2013-08-17 LAB — URINE CULTURE

## 2013-08-18 LAB — PROTIME-INR
INR: 2.9
PROTHROMBIN TIME: 29.6 s — AB (ref 11.5–14.7)

## 2013-08-19 DIAGNOSIS — R072 Precordial pain: Secondary | ICD-10-CM | POA: Diagnosis not present

## 2013-08-19 DIAGNOSIS — R059 Cough, unspecified: Secondary | ICD-10-CM | POA: Diagnosis not present

## 2013-08-19 DIAGNOSIS — I2510 Atherosclerotic heart disease of native coronary artery without angina pectoris: Secondary | ICD-10-CM | POA: Diagnosis not present

## 2013-08-19 DIAGNOSIS — R0602 Shortness of breath: Secondary | ICD-10-CM | POA: Diagnosis not present

## 2013-08-19 DIAGNOSIS — I509 Heart failure, unspecified: Secondary | ICD-10-CM | POA: Diagnosis not present

## 2013-08-21 LAB — PROTIME-INR
INR: 4.6
Prothrombin Time: 42.1 secs — ABNORMAL HIGH (ref 11.5–14.7)

## 2013-08-25 LAB — PROTIME-INR
INR: 4.1 — AB
Prothrombin Time: 38.1 secs — ABNORMAL HIGH (ref 11.5–14.7)

## 2013-08-27 ENCOUNTER — Inpatient Hospital Stay: Payer: Self-pay | Admitting: Internal Medicine

## 2013-08-27 ENCOUNTER — Ambulatory Visit
Admit: 2013-08-27 | Disposition: A | Discharge status: Home / Self Care | Payer: Self-pay | Attending: Nurse Practitioner | Admitting: Nurse Practitioner

## 2013-08-27 ENCOUNTER — Encounter: Payer: Self-pay | Admitting: Internal Medicine

## 2013-08-27 ENCOUNTER — Ambulatory Visit: Payer: Self-pay | Admitting: Hospice and Palliative Medicine

## 2013-08-27 DIAGNOSIS — Z66 Do not resuscitate: Secondary | ICD-10-CM | POA: Diagnosis not present

## 2013-08-27 DIAGNOSIS — R5381 Other malaise: Secondary | ICD-10-CM | POA: Diagnosis not present

## 2013-08-27 DIAGNOSIS — I498 Other specified cardiac arrhythmias: Secondary | ICD-10-CM | POA: Diagnosis not present

## 2013-08-27 DIAGNOSIS — R143 Flatulence: Secondary | ICD-10-CM | POA: Diagnosis not present

## 2013-08-27 DIAGNOSIS — N390 Urinary tract infection, site not specified: Secondary | ICD-10-CM | POA: Diagnosis not present

## 2013-08-27 DIAGNOSIS — N170 Acute kidney failure with tubular necrosis: Secondary | ICD-10-CM | POA: Diagnosis not present

## 2013-08-27 DIAGNOSIS — R110 Nausea: Secondary | ICD-10-CM | POA: Diagnosis not present

## 2013-08-27 DIAGNOSIS — K929 Disease of digestive system, unspecified: Secondary | ICD-10-CM | POA: Diagnosis not present

## 2013-08-27 DIAGNOSIS — G9349 Other encephalopathy: Secondary | ICD-10-CM | POA: Diagnosis present

## 2013-08-27 DIAGNOSIS — Z0389 Encounter for observation for other suspected diseases and conditions ruled out: Secondary | ICD-10-CM | POA: Diagnosis not present

## 2013-08-27 DIAGNOSIS — K56609 Unspecified intestinal obstruction, unspecified as to partial versus complete obstruction: Secondary | ICD-10-CM | POA: Diagnosis not present

## 2013-08-27 DIAGNOSIS — Z7901 Long term (current) use of anticoagulants: Secondary | ICD-10-CM | POA: Diagnosis not present

## 2013-08-27 DIAGNOSIS — J90 Pleural effusion, not elsewhere classified: Secondary | ICD-10-CM | POA: Diagnosis not present

## 2013-08-27 DIAGNOSIS — D638 Anemia in other chronic diseases classified elsewhere: Secondary | ICD-10-CM | POA: Diagnosis present

## 2013-08-27 DIAGNOSIS — N289 Disorder of kidney and ureter, unspecified: Secondary | ICD-10-CM | POA: Diagnosis not present

## 2013-08-27 DIAGNOSIS — E876 Hypokalemia: Secondary | ICD-10-CM | POA: Diagnosis not present

## 2013-08-27 DIAGNOSIS — Z951 Presence of aortocoronary bypass graft: Secondary | ICD-10-CM | POA: Diagnosis not present

## 2013-08-27 DIAGNOSIS — E785 Hyperlipidemia, unspecified: Secondary | ICD-10-CM | POA: Diagnosis present

## 2013-08-27 DIAGNOSIS — R141 Gas pain: Secondary | ICD-10-CM | POA: Diagnosis not present

## 2013-08-27 DIAGNOSIS — I2589 Other forms of chronic ischemic heart disease: Secondary | ICD-10-CM | POA: Diagnosis present

## 2013-08-27 DIAGNOSIS — R5383 Other fatigue: Secondary | ICD-10-CM | POA: Diagnosis not present

## 2013-08-27 DIAGNOSIS — D649 Anemia, unspecified: Secondary | ICD-10-CM | POA: Diagnosis not present

## 2013-08-27 DIAGNOSIS — E871 Hypo-osmolality and hyponatremia: Secondary | ICD-10-CM | POA: Diagnosis not present

## 2013-08-27 DIAGNOSIS — I2510 Atherosclerotic heart disease of native coronary artery without angina pectoris: Secondary | ICD-10-CM | POA: Diagnosis not present

## 2013-08-27 DIAGNOSIS — N179 Acute kidney failure, unspecified: Secondary | ICD-10-CM | POA: Diagnosis not present

## 2013-08-27 DIAGNOSIS — I252 Old myocardial infarction: Secondary | ICD-10-CM | POA: Diagnosis not present

## 2013-08-27 DIAGNOSIS — K560 Paralytic ileus: Secondary | ICD-10-CM | POA: Diagnosis not present

## 2013-08-27 DIAGNOSIS — E119 Type 2 diabetes mellitus without complications: Secondary | ICD-10-CM | POA: Diagnosis present

## 2013-08-27 DIAGNOSIS — T82897A Other specified complication of cardiac prosthetic devices, implants and grafts, initial encounter: Secondary | ICD-10-CM | POA: Diagnosis not present

## 2013-08-27 DIAGNOSIS — E7800 Pure hypercholesterolemia, unspecified: Secondary | ICD-10-CM | POA: Diagnosis present

## 2013-08-27 DIAGNOSIS — I509 Heart failure, unspecified: Secondary | ICD-10-CM | POA: Diagnosis present

## 2013-08-27 DIAGNOSIS — I4891 Unspecified atrial fibrillation: Secondary | ICD-10-CM | POA: Diagnosis not present

## 2013-08-27 DIAGNOSIS — Z5189 Encounter for other specified aftercare: Secondary | ICD-10-CM | POA: Diagnosis not present

## 2013-08-27 DIAGNOSIS — I5042 Chronic combined systolic (congestive) and diastolic (congestive) heart failure: Secondary | ICD-10-CM | POA: Diagnosis present

## 2013-08-27 DIAGNOSIS — M6281 Muscle weakness (generalized): Secondary | ICD-10-CM | POA: Diagnosis not present

## 2013-08-27 DIAGNOSIS — I059 Rheumatic mitral valve disease, unspecified: Secondary | ICD-10-CM | POA: Diagnosis present

## 2013-08-27 DIAGNOSIS — J9819 Other pulmonary collapse: Secondary | ICD-10-CM | POA: Diagnosis not present

## 2013-08-27 DIAGNOSIS — I129 Hypertensive chronic kidney disease with stage 1 through stage 4 chronic kidney disease, or unspecified chronic kidney disease: Secondary | ICD-10-CM | POA: Diagnosis present

## 2013-08-27 DIAGNOSIS — I5022 Chronic systolic (congestive) heart failure: Secondary | ICD-10-CM | POA: Diagnosis not present

## 2013-08-27 DIAGNOSIS — R935 Abnormal findings on diagnostic imaging of other abdominal regions, including retroperitoneum: Secondary | ICD-10-CM | POA: Diagnosis not present

## 2013-08-27 DIAGNOSIS — K5669 Other intestinal obstruction: Secondary | ICD-10-CM | POA: Diagnosis not present

## 2013-08-27 DIAGNOSIS — Z515 Encounter for palliative care: Secondary | ICD-10-CM | POA: Diagnosis not present

## 2013-08-27 DIAGNOSIS — N19 Unspecified kidney failure: Secondary | ICD-10-CM | POA: Diagnosis not present

## 2013-08-27 DIAGNOSIS — D500 Iron deficiency anemia secondary to blood loss (chronic): Secondary | ICD-10-CM | POA: Diagnosis not present

## 2013-08-27 DIAGNOSIS — I2582 Chronic total occlusion of coronary artery: Secondary | ICD-10-CM | POA: Diagnosis not present

## 2013-08-27 DIAGNOSIS — N184 Chronic kidney disease, stage 4 (severe): Secondary | ICD-10-CM | POA: Diagnosis present

## 2013-08-27 LAB — COMPREHENSIVE METABOLIC PANEL
ALBUMIN: 3.4 g/dL (ref 3.4–5.0)
AST: 40 U/L — AB (ref 15–37)
Alkaline Phosphatase: 102 U/L
Anion Gap: 13 (ref 7–16)
BUN: 75 mg/dL — ABNORMAL HIGH (ref 7–18)
Bilirubin,Total: 0.5 mg/dL (ref 0.2–1.0)
CALCIUM: 8.8 mg/dL (ref 8.5–10.1)
CREATININE: 7.55 mg/dL — AB (ref 0.60–1.30)
Chloride: 98 mmol/L (ref 98–107)
Co2: 18 mmol/L — ABNORMAL LOW (ref 21–32)
EGFR (Non-African Amer.): 5 — ABNORMAL LOW
GFR CALC AF AMER: 5 — AB
GLUCOSE: 89 mg/dL (ref 65–99)
OSMOLALITY: 281 (ref 275–301)
Potassium: 4.3 mmol/L (ref 3.5–5.1)
SGPT (ALT): 29 U/L (ref 12–78)
Sodium: 129 mmol/L — ABNORMAL LOW (ref 136–145)
Total Protein: 6.9 g/dL (ref 6.4–8.2)

## 2013-08-27 LAB — CBC WITH DIFFERENTIAL/PLATELET
BASOS PCT: 0.7 %
Basophil #: 0.1 10*3/uL (ref 0.0–0.1)
Eosinophil #: 0.4 10*3/uL (ref 0.0–0.7)
Eosinophil %: 3.7 %
HCT: 31.2 % — ABNORMAL LOW (ref 35.0–47.0)
HGB: 10.4 g/dL — ABNORMAL LOW (ref 12.0–16.0)
LYMPHS PCT: 22.3 %
Lymphocyte #: 2.5 10*3/uL (ref 1.0–3.6)
MCH: 30.6 pg (ref 26.0–34.0)
MCHC: 33.4 g/dL (ref 32.0–36.0)
MCV: 92 fL (ref 80–100)
MONO ABS: 0.8 x10 3/mm (ref 0.2–0.9)
Monocyte %: 6.8 %
NEUTROS ABS: 7.3 10*3/uL — AB (ref 1.4–6.5)
NEUTROS PCT: 66.5 %
Platelet: 177 10*3/uL (ref 150–440)
RBC: 3.41 10*6/uL — ABNORMAL LOW (ref 3.80–5.20)
RDW: 14.4 % (ref 11.5–14.5)
WBC: 11 10*3/uL (ref 3.6–11.0)

## 2013-08-27 LAB — URINALYSIS, COMPLETE
Bilirubin,UR: NEGATIVE
GLUCOSE, UR: NEGATIVE mg/dL (ref 0–75)
Ketone: NEGATIVE
NITRITE: NEGATIVE
PH: 5 (ref 4.5–8.0)
Specific Gravity: 1.015 (ref 1.003–1.030)
Squamous Epithelial: 8
WBC UR: 5 /HPF (ref 0–5)

## 2013-08-27 LAB — TSH: THYROID STIMULATING HORM: 3.45 u[IU]/mL

## 2013-08-27 LAB — MAGNESIUM: MAGNESIUM: 1.3 mg/dL — AB

## 2013-08-28 LAB — CBC WITH DIFFERENTIAL/PLATELET
BASOS ABS: 0.1 10*3/uL (ref 0.0–0.1)
BASOS PCT: 0.6 %
EOS ABS: 0.4 10*3/uL (ref 0.0–0.7)
Eosinophil %: 4.6 %
HCT: 26.1 % — AB (ref 35.0–47.0)
HGB: 9.1 g/dL — ABNORMAL LOW (ref 12.0–16.0)
Lymphocyte #: 1.6 10*3/uL (ref 1.0–3.6)
Lymphocyte %: 18.7 %
MCH: 31.2 pg (ref 26.0–34.0)
MCHC: 34.9 g/dL (ref 32.0–36.0)
MCV: 90 fL (ref 80–100)
Monocyte #: 0.8 x10 3/mm (ref 0.2–0.9)
Monocyte %: 8.9 %
Neutrophil #: 5.9 10*3/uL (ref 1.4–6.5)
Neutrophil %: 67.2 %
Platelet: 143 10*3/uL — ABNORMAL LOW (ref 150–440)
RBC: 2.92 10*6/uL — ABNORMAL LOW (ref 3.80–5.20)
RDW: 14.6 % — ABNORMAL HIGH (ref 11.5–14.5)
WBC: 8.8 10*3/uL (ref 3.6–11.0)

## 2013-08-28 LAB — BASIC METABOLIC PANEL
Anion Gap: 12 (ref 7–16)
BUN: 70 mg/dL — ABNORMAL HIGH (ref 7–18)
CHLORIDE: 101 mmol/L (ref 98–107)
Calcium, Total: 8.2 mg/dL — ABNORMAL LOW (ref 8.5–10.1)
Co2: 17 mmol/L — ABNORMAL LOW (ref 21–32)
Creatinine: 7.75 mg/dL — ABNORMAL HIGH (ref 0.60–1.30)
EGFR (African American): 5 — ABNORMAL LOW
EGFR (Non-African Amer.): 4 — ABNORMAL LOW
Glucose: 102 mg/dL — ABNORMAL HIGH (ref 65–99)
Osmolality: 281 (ref 275–301)
Potassium: 4.1 mmol/L (ref 3.5–5.1)
SODIUM: 130 mmol/L — AB (ref 136–145)

## 2013-08-28 LAB — TSH: Thyroid Stimulating Horm: 2.25 u[IU]/mL

## 2013-08-28 LAB — MAGNESIUM: Magnesium: 1.6 mg/dL — ABNORMAL LOW

## 2013-08-29 LAB — CBC WITH DIFFERENTIAL/PLATELET
Basophil #: 0 10*3/uL (ref 0.0–0.1)
Basophil %: 0.5 %
Eosinophil #: 0.4 10*3/uL (ref 0.0–0.7)
Eosinophil %: 4.7 %
HCT: 24.4 % — ABNORMAL LOW (ref 35.0–47.0)
HGB: 8.6 g/dL — AB (ref 12.0–16.0)
Lymphocyte #: 1.5 10*3/uL (ref 1.0–3.6)
Lymphocyte %: 19.4 %
MCH: 31.6 pg (ref 26.0–34.0)
MCHC: 35.3 g/dL (ref 32.0–36.0)
MCV: 90 fL (ref 80–100)
MONO ABS: 0.7 x10 3/mm (ref 0.2–0.9)
Monocyte %: 8.3 %
NEUTROS ABS: 5.3 10*3/uL (ref 1.4–6.5)
Neutrophil %: 67.1 %
Platelet: 109 10*3/uL — ABNORMAL LOW (ref 150–440)
RBC: 2.73 10*6/uL — ABNORMAL LOW (ref 3.80–5.20)
RDW: 14.7 % — ABNORMAL HIGH (ref 11.5–14.5)
WBC: 7.9 10*3/uL (ref 3.6–11.0)

## 2013-08-29 LAB — BASIC METABOLIC PANEL
ANION GAP: 14 (ref 7–16)
BUN: 65 mg/dL — AB (ref 7–18)
CO2: 16 mmol/L — AB (ref 21–32)
Calcium, Total: 8.1 mg/dL — ABNORMAL LOW (ref 8.5–10.1)
Chloride: 106 mmol/L (ref 98–107)
Creatinine: 7.34 mg/dL — ABNORMAL HIGH (ref 0.60–1.30)
EGFR (African American): 5 — ABNORMAL LOW
GFR CALC NON AF AMER: 5 — AB
GLUCOSE: 81 mg/dL (ref 65–99)
Osmolality: 290 (ref 275–301)
POTASSIUM: 3.6 mmol/L (ref 3.5–5.1)
Sodium: 136 mmol/L (ref 136–145)

## 2013-08-30 LAB — BASIC METABOLIC PANEL
Anion Gap: 11 (ref 7–16)
BUN: 59 mg/dL — ABNORMAL HIGH (ref 7–18)
CHLORIDE: 109 mmol/L — AB (ref 98–107)
Calcium, Total: 8.4 mg/dL — ABNORMAL LOW (ref 8.5–10.1)
Co2: 18 mmol/L — ABNORMAL LOW (ref 21–32)
Creatinine: 6.09 mg/dL — ABNORMAL HIGH (ref 0.60–1.30)
EGFR (African American): 7 — ABNORMAL LOW
GFR CALC NON AF AMER: 6 — AB
GLUCOSE: 89 mg/dL (ref 65–99)
OSMOLALITY: 292 (ref 275–301)
POTASSIUM: 3.6 mmol/L (ref 3.5–5.1)
SODIUM: 138 mmol/L (ref 136–145)

## 2013-08-30 LAB — URINE CULTURE

## 2013-08-31 LAB — RENAL FUNCTION PANEL
Albumin: 2.6 g/dL — ABNORMAL LOW (ref 3.4–5.0)
Anion Gap: 11 (ref 7–16)
BUN: 48 mg/dL — ABNORMAL HIGH (ref 7–18)
CALCIUM: 8.2 mg/dL — AB (ref 8.5–10.1)
CO2: 19 mmol/L — AB (ref 21–32)
Chloride: 111 mmol/L — ABNORMAL HIGH (ref 98–107)
Creatinine: 4.96 mg/dL — ABNORMAL HIGH (ref 0.60–1.30)
EGFR (Non-African Amer.): 8 — ABNORMAL LOW
GFR CALC AF AMER: 9 — AB
Glucose: 79 mg/dL (ref 65–99)
Osmolality: 293 (ref 275–301)
Phosphorus: 3.2 mg/dL (ref 2.5–4.9)
Potassium: 3.4 mmol/L — ABNORMAL LOW (ref 3.5–5.1)
SODIUM: 141 mmol/L (ref 136–145)

## 2013-08-31 LAB — CBC WITH DIFFERENTIAL/PLATELET
Basophil #: 0 10*3/uL (ref 0.0–0.1)
Basophil %: 0.4 %
Eosinophil #: 0.4 10*3/uL (ref 0.0–0.7)
Eosinophil %: 6.2 %
HCT: 25.5 % — ABNORMAL LOW (ref 35.0–47.0)
HGB: 8.9 g/dL — ABNORMAL LOW (ref 12.0–16.0)
Lymphocyte #: 1.6 10*3/uL (ref 1.0–3.6)
Lymphocyte %: 22.5 %
MCH: 31.7 pg (ref 26.0–34.0)
MCHC: 34.9 g/dL (ref 32.0–36.0)
MCV: 91 fL (ref 80–100)
MONO ABS: 0.6 x10 3/mm (ref 0.2–0.9)
Monocyte %: 7.8 %
NEUTROS ABS: 4.5 10*3/uL (ref 1.4–6.5)
Neutrophil %: 63.1 %
PLATELETS: 119 10*3/uL — AB (ref 150–440)
RBC: 2.8 10*6/uL — ABNORMAL LOW (ref 3.80–5.20)
RDW: 14.9 % — ABNORMAL HIGH (ref 11.5–14.5)
WBC: 7.1 10*3/uL (ref 3.6–11.0)

## 2013-09-01 ENCOUNTER — Other Ambulatory Visit: Payer: Self-pay | Admitting: Thoracic Surgery (Cardiothoracic Vascular Surgery)

## 2013-09-01 DIAGNOSIS — I2510 Atherosclerotic heart disease of native coronary artery without angina pectoris: Secondary | ICD-10-CM

## 2013-09-01 LAB — BASIC METABOLIC PANEL
Anion Gap: 11 (ref 7–16)
BUN: 36 mg/dL — ABNORMAL HIGH (ref 7–18)
CALCIUM: 7.8 mg/dL — AB (ref 8.5–10.1)
CHLORIDE: 111 mmol/L — AB (ref 98–107)
CO2: 19 mmol/L — AB (ref 21–32)
Creatinine: 3.9 mg/dL — ABNORMAL HIGH (ref 0.60–1.30)
EGFR (African American): 12 — ABNORMAL LOW
EGFR (Non-African Amer.): 10 — ABNORMAL LOW
Glucose: 84 mg/dL (ref 65–99)
OSMOLALITY: 289 (ref 275–301)
Potassium: 3.2 mmol/L — ABNORMAL LOW (ref 3.5–5.1)
Sodium: 141 mmol/L (ref 136–145)

## 2013-09-01 LAB — CBC WITH DIFFERENTIAL/PLATELET
BASOS PCT: 0.9 %
Basophil #: 0.1 10*3/uL (ref 0.0–0.1)
EOS ABS: 0.5 10*3/uL (ref 0.0–0.7)
EOS PCT: 7.4 %
HCT: 24.6 % — ABNORMAL LOW (ref 35.0–47.0)
HGB: 8.2 g/dL — AB (ref 12.0–16.0)
LYMPHS ABS: 1.7 10*3/uL (ref 1.0–3.6)
LYMPHS PCT: 23 %
MCH: 30.1 pg (ref 26.0–34.0)
MCHC: 33.3 g/dL (ref 32.0–36.0)
MCV: 90 fL (ref 80–100)
Monocyte #: 0.7 x10 3/mm (ref 0.2–0.9)
Monocyte %: 8.8 %
NEUTROS ABS: 4.4 10*3/uL (ref 1.4–6.5)
Neutrophil %: 59.9 %
Platelet: 128 10*3/uL — ABNORMAL LOW (ref 150–440)
RBC: 2.73 10*6/uL — ABNORMAL LOW (ref 3.80–5.20)
RDW: 14.6 % — AB (ref 11.5–14.5)
WBC: 7.4 10*3/uL (ref 3.6–11.0)

## 2013-09-02 ENCOUNTER — Ambulatory Visit: Payer: BC Managed Care – PPO | Admitting: Thoracic Surgery (Cardiothoracic Vascular Surgery)

## 2013-09-02 LAB — BASIC METABOLIC PANEL
ANION GAP: 11 (ref 7–16)
BUN: 33 mg/dL — ABNORMAL HIGH (ref 7–18)
CALCIUM: 7.8 mg/dL — AB (ref 8.5–10.1)
Chloride: 111 mmol/L — ABNORMAL HIGH (ref 98–107)
Co2: 19 mmol/L — ABNORMAL LOW (ref 21–32)
Creatinine: 3.13 mg/dL — ABNORMAL HIGH (ref 0.60–1.30)
EGFR (African American): 15 — ABNORMAL LOW
EGFR (Non-African Amer.): 13 — ABNORMAL LOW
GLUCOSE: 82 mg/dL (ref 65–99)
OSMOLALITY: 288 (ref 275–301)
Potassium: 3.3 mmol/L — ABNORMAL LOW (ref 3.5–5.1)
Sodium: 141 mmol/L (ref 136–145)

## 2013-09-03 LAB — BASIC METABOLIC PANEL
Anion Gap: 10 (ref 7–16)
BUN: 30 mg/dL — AB (ref 7–18)
Calcium, Total: 8.3 mg/dL — ABNORMAL LOW (ref 8.5–10.1)
Chloride: 104 mmol/L (ref 98–107)
Co2: 23 mmol/L (ref 21–32)
Creatinine: 2.48 mg/dL — ABNORMAL HIGH (ref 0.60–1.30)
EGFR (African American): 20 — ABNORMAL LOW
GFR CALC NON AF AMER: 17 — AB
GLUCOSE: 95 mg/dL (ref 65–99)
Osmolality: 280 (ref 275–301)
POTASSIUM: 3.2 mmol/L — AB (ref 3.5–5.1)
Sodium: 137 mmol/L (ref 136–145)

## 2013-09-04 LAB — BASIC METABOLIC PANEL
ANION GAP: 11 (ref 7–16)
BUN: 22 mg/dL — AB (ref 7–18)
Calcium, Total: 7.9 mg/dL — ABNORMAL LOW (ref 8.5–10.1)
Chloride: 110 mmol/L — ABNORMAL HIGH (ref 98–107)
Co2: 21 mmol/L (ref 21–32)
Creatinine: 2.2 mg/dL — ABNORMAL HIGH (ref 0.60–1.30)
EGFR (African American): 23 — ABNORMAL LOW
EGFR (Non-African Amer.): 20 — ABNORMAL LOW
Glucose: 88 mg/dL (ref 65–99)
Osmolality: 286 (ref 275–301)
Potassium: 3 mmol/L — ABNORMAL LOW (ref 3.5–5.1)
SODIUM: 142 mmol/L (ref 136–145)

## 2013-09-04 LAB — PROTIME-INR
INR: 1.3
Prothrombin Time: 16 secs — ABNORMAL HIGH (ref 11.5–14.7)

## 2013-09-05 LAB — CBC WITH DIFFERENTIAL/PLATELET
Basophil #: 0 10*3/uL (ref 0.0–0.1)
Basophil %: 0.6 %
Eosinophil #: 0.4 10*3/uL (ref 0.0–0.7)
Eosinophil %: 5.1 %
HCT: 23.5 % — AB (ref 35.0–47.0)
HGB: 8.1 g/dL — AB (ref 12.0–16.0)
Lymphocyte #: 1.5 10*3/uL (ref 1.0–3.6)
Lymphocyte %: 17.4 %
MCH: 32.3 pg (ref 26.0–34.0)
MCHC: 34.4 g/dL (ref 32.0–36.0)
MCV: 94 fL (ref 80–100)
MONO ABS: 0.7 x10 3/mm (ref 0.2–0.9)
MONOS PCT: 7.5 %
NEUTROS PCT: 69.4 %
Neutrophil #: 6.1 10*3/uL (ref 1.4–6.5)
PLATELETS: 119 10*3/uL — AB (ref 150–440)
RBC: 2.5 10*6/uL — ABNORMAL LOW (ref 3.80–5.20)
RDW: 14.7 % — ABNORMAL HIGH (ref 11.5–14.5)
WBC: 8.8 10*3/uL (ref 3.6–11.0)

## 2013-09-05 LAB — RETICULOCYTES
ABSOLUTE RETIC COUNT: 0.03 10*6/uL (ref 0.019–0.186)
Reticulocyte: 1.1 % (ref 0.4–3.1)

## 2013-09-05 LAB — BASIC METABOLIC PANEL
Anion Gap: 9 (ref 7–16)
BUN: 22 mg/dL — ABNORMAL HIGH (ref 7–18)
CREATININE: 1.95 mg/dL — AB (ref 0.60–1.30)
Calcium, Total: 8.1 mg/dL — ABNORMAL LOW (ref 8.5–10.1)
Chloride: 110 mmol/L — ABNORMAL HIGH (ref 98–107)
Co2: 21 mmol/L (ref 21–32)
EGFR (African American): 27 — ABNORMAL LOW
GFR CALC NON AF AMER: 23 — AB
GLUCOSE: 94 mg/dL (ref 65–99)
Osmolality: 282 (ref 275–301)
Potassium: 3.1 mmol/L — ABNORMAL LOW (ref 3.5–5.1)
Sodium: 140 mmol/L (ref 136–145)

## 2013-09-05 LAB — IRON AND TIBC
IRON BIND. CAP.(TOTAL): 242 ug/dL — AB (ref 250–450)
Iron Saturation: 17 %
Iron: 41 ug/dL — ABNORMAL LOW (ref 50–170)
Unbound Iron-Bind.Cap.: 201 ug/dL

## 2013-09-05 LAB — FERRITIN: Ferritin (ARMC): 235 ng/mL (ref 8–388)

## 2013-09-05 LAB — OCCULT BLOOD X 1 CARD TO LAB, STOOL: Occult Blood, Feces: NEGATIVE

## 2013-09-05 LAB — PROTIME-INR
INR: 1.3
Prothrombin Time: 16.2 secs — ABNORMAL HIGH (ref 11.5–14.7)

## 2013-09-06 LAB — CBC WITH DIFFERENTIAL/PLATELET
Basophil #: 0 10*3/uL (ref 0.0–0.1)
Basophil %: 0.4 %
Eosinophil #: 0.1 10*3/uL (ref 0.0–0.7)
Eosinophil %: 1.7 %
HCT: 25.4 % — ABNORMAL LOW (ref 35.0–47.0)
HGB: 8.3 g/dL — AB (ref 12.0–16.0)
LYMPHS ABS: 0.9 10*3/uL — AB (ref 1.0–3.6)
Lymphocyte %: 10.3 %
MCH: 31 pg (ref 26.0–34.0)
MCHC: 32.8 g/dL (ref 32.0–36.0)
MCV: 94 fL (ref 80–100)
Monocyte #: 0.7 x10 3/mm (ref 0.2–0.9)
Monocyte %: 7.4 %
NEUTROS ABS: 7.1 10*3/uL — AB (ref 1.4–6.5)
Neutrophil %: 80.2 %
Platelet: 128 10*3/uL — ABNORMAL LOW (ref 150–440)
RBC: 2.7 10*6/uL — AB (ref 3.80–5.20)
RDW: 15.2 % — ABNORMAL HIGH (ref 11.5–14.5)
WBC: 8.8 10*3/uL (ref 3.6–11.0)

## 2013-09-06 LAB — BASIC METABOLIC PANEL
ANION GAP: 11 (ref 7–16)
BUN: 17 mg/dL (ref 7–18)
Calcium, Total: 8.2 mg/dL — ABNORMAL LOW (ref 8.5–10.1)
Chloride: 111 mmol/L — ABNORMAL HIGH (ref 98–107)
Co2: 21 mmol/L (ref 21–32)
Creatinine: 1.63 mg/dL — ABNORMAL HIGH (ref 0.60–1.30)
EGFR (African American): 34 — ABNORMAL LOW
GFR CALC NON AF AMER: 29 — AB
GLUCOSE: 100 mg/dL — AB (ref 65–99)
OSMOLALITY: 287 (ref 275–301)
Potassium: 2.8 mmol/L — ABNORMAL LOW (ref 3.5–5.1)
SODIUM: 143 mmol/L (ref 136–145)

## 2013-09-06 LAB — PROTIME-INR
INR: 1.4
Prothrombin Time: 17 secs — ABNORMAL HIGH (ref 11.5–14.7)

## 2013-09-06 LAB — MAGNESIUM: Magnesium: 1.3 mg/dL — ABNORMAL LOW

## 2013-09-07 LAB — BASIC METABOLIC PANEL
ANION GAP: 13 (ref 7–16)
Anion Gap: 12 (ref 7–16)
BUN: 18 mg/dL (ref 7–18)
BUN: 17 mg/dL (ref 7–18)
CALCIUM: 8.2 mg/dL — AB (ref 8.5–10.1)
CHLORIDE: 109 mmol/L — AB (ref 98–107)
CHLORIDE: 110 mmol/L — AB (ref 98–107)
Calcium, Total: 8.3 mg/dL — ABNORMAL LOW (ref 8.5–10.1)
Co2: 18 mmol/L — ABNORMAL LOW (ref 21–32)
Co2: 18 mmol/L — ABNORMAL LOW (ref 21–32)
Creatinine: 1.56 mg/dL — ABNORMAL HIGH (ref 0.60–1.30)
Creatinine: 1.47 mg/dL — ABNORMAL HIGH (ref 0.60–1.30)
EGFR (Non-African Amer.): 31 — ABNORMAL LOW
EGFR (Non-African Amer.): 33 — ABNORMAL LOW
GFR CALC AF AMER: 35 — AB
GFR CALC AF AMER: 38 — AB
GLUCOSE: 120 mg/dL — AB (ref 65–99)
Glucose: 97 mg/dL (ref 65–99)
OSMOLALITY: 281 (ref 275–301)
OSMOLALITY: 282 (ref 275–301)
Potassium: 2.5 mmol/L — CL (ref 3.5–5.1)
Potassium: 2.8 mmol/L — ABNORMAL LOW (ref 3.5–5.1)
Sodium: 140 mmol/L (ref 136–145)
Sodium: 140 mmol/L (ref 136–145)

## 2013-09-08 LAB — HEMOGLOBIN: HGB: 8.6 g/dL — ABNORMAL LOW (ref 12.0–16.0)

## 2013-09-08 LAB — BASIC METABOLIC PANEL
Anion Gap: 11 (ref 7–16)
BUN: 21 mg/dL — AB (ref 7–18)
CO2: 17 mmol/L — AB (ref 21–32)
CREATININE: 1.49 mg/dL — AB (ref 0.60–1.30)
Calcium, Total: 7.9 mg/dL — ABNORMAL LOW (ref 8.5–10.1)
Chloride: 112 mmol/L — ABNORMAL HIGH (ref 98–107)
EGFR (Non-African Amer.): 32 — ABNORMAL LOW
GFR CALC AF AMER: 38 — AB
GLUCOSE: 155 mg/dL — AB (ref 65–99)
OSMOLALITY: 286 (ref 275–301)
POTASSIUM: 3.3 mmol/L — AB (ref 3.5–5.1)
Sodium: 140 mmol/L (ref 136–145)

## 2013-09-08 LAB — POTASSIUM: Potassium: 2.8 mmol/L — ABNORMAL LOW (ref 3.5–5.1)

## 2013-09-08 LAB — PROTIME-INR
INR: 2.7
PROTHROMBIN TIME: 27.9 s — AB (ref 11.5–14.7)

## 2013-09-08 LAB — PLATELET COUNT: PLATELETS: 172 10*3/uL (ref 150–440)

## 2013-09-08 LAB — CREATININE, SERUM
CREATININE: 1.5 mg/dL — AB (ref 0.60–1.30)
GFR CALC AF AMER: 37 — AB
GFR CALC NON AF AMER: 32 — AB

## 2013-09-08 LAB — MAGNESIUM: MAGNESIUM: 3.4 mg/dL — AB

## 2013-09-09 LAB — HEMOGLOBIN: HGB: 7.8 g/dL — AB (ref 12.0–16.0)

## 2013-09-09 LAB — CREATININE, SERUM
Creatinine: 1.43 mg/dL — ABNORMAL HIGH (ref 0.60–1.30)
EGFR (Non-African Amer.): 34 — ABNORMAL LOW
GFR CALC AF AMER: 39 — AB

## 2013-09-09 LAB — POTASSIUM: POTASSIUM: 3.2 mmol/L — AB (ref 3.5–5.1)

## 2013-09-09 LAB — MAGNESIUM: MAGNESIUM: 2.3 mg/dL

## 2013-09-09 LAB — PROTIME-INR
INR: 2.7
PROTHROMBIN TIME: 27.7 s — AB (ref 11.5–14.7)

## 2013-09-10 LAB — CBC WITH DIFFERENTIAL/PLATELET
BASOS ABS: 0.1 10*3/uL (ref 0.0–0.1)
Basophil %: 0.6 %
Eosinophil #: 0.2 10*3/uL (ref 0.0–0.7)
Eosinophil %: 1.4 %
HCT: 24.9 % — AB (ref 35.0–47.0)
HGB: 8.1 g/dL — ABNORMAL LOW (ref 12.0–16.0)
Lymphocyte #: 1.4 10*3/uL (ref 1.0–3.6)
Lymphocyte %: 11.5 %
MCH: 30.4 pg (ref 26.0–34.0)
MCHC: 32.4 g/dL (ref 32.0–36.0)
MCV: 94 fL (ref 80–100)
Monocyte #: 0.8 x10 3/mm (ref 0.2–0.9)
Monocyte %: 6.2 %
NEUTROS PCT: 80.3 %
Neutrophil #: 10 10*3/uL — ABNORMAL HIGH (ref 1.4–6.5)
PLATELETS: 190 10*3/uL (ref 150–440)
RBC: 2.65 10*6/uL — ABNORMAL LOW (ref 3.80–5.20)
RDW: 15.6 % — ABNORMAL HIGH (ref 11.5–14.5)
WBC: 12.4 10*3/uL — AB (ref 3.6–11.0)

## 2013-09-10 LAB — BASIC METABOLIC PANEL
Anion Gap: 9 (ref 7–16)
BUN: 17 mg/dL (ref 7–18)
Calcium, Total: 8.2 mg/dL — ABNORMAL LOW (ref 8.5–10.1)
Chloride: 120 mmol/L — ABNORMAL HIGH (ref 98–107)
Co2: 17 mmol/L — ABNORMAL LOW (ref 21–32)
Creatinine: 1.21 mg/dL (ref 0.60–1.30)
EGFR (Non-African Amer.): 42 — ABNORMAL LOW
GFR CALC AF AMER: 48 — AB
Glucose: 102 mg/dL — ABNORMAL HIGH (ref 65–99)
OSMOLALITY: 292 (ref 275–301)
Potassium: 4 mmol/L (ref 3.5–5.1)
Sodium: 146 mmol/L — ABNORMAL HIGH (ref 136–145)

## 2013-09-10 LAB — PROTIME-INR
INR: 4.6 — AB
Prothrombin Time: 41.8 secs — ABNORMAL HIGH (ref 11.5–14.7)

## 2013-09-10 LAB — CLOSTRIDIUM DIFFICILE(ARMC)

## 2013-09-11 LAB — CBC WITH DIFFERENTIAL/PLATELET
BASOS ABS: 0.1 10*3/uL (ref 0.0–0.1)
BASOS PCT: 0.5 %
EOS ABS: 0.3 10*3/uL (ref 0.0–0.7)
EOS PCT: 2.7 %
HCT: 26.7 % — ABNORMAL LOW (ref 35.0–47.0)
HGB: 8.7 g/dL — AB (ref 12.0–16.0)
LYMPHS PCT: 14.7 %
Lymphocyte #: 1.6 10*3/uL (ref 1.0–3.6)
MCH: 30.9 pg (ref 26.0–34.0)
MCHC: 32.5 g/dL (ref 32.0–36.0)
MCV: 95 fL (ref 80–100)
MONOS PCT: 5.7 %
Monocyte #: 0.6 x10 3/mm (ref 0.2–0.9)
NEUTROS ABS: 8.6 10*3/uL — AB (ref 1.4–6.5)
NEUTROS PCT: 76.4 %
Platelet: 194 10*3/uL (ref 150–440)
RBC: 2.81 10*6/uL — ABNORMAL LOW (ref 3.80–5.20)
RDW: 16.1 % — AB (ref 11.5–14.5)
WBC: 11.2 10*3/uL — AB (ref 3.6–11.0)

## 2013-09-11 LAB — SODIUM: Sodium: 144 mmol/L (ref 136–145)

## 2013-09-11 LAB — PROTIME-INR
INR: 5.2 — AB
Prothrombin Time: 46.2 secs — ABNORMAL HIGH (ref 11.5–14.7)

## 2013-09-11 LAB — STOOL CULTURE

## 2013-09-12 LAB — BASIC METABOLIC PANEL
ANION GAP: 3 — AB (ref 7–16)
BUN: 13 mg/dL (ref 7–18)
CREATININE: 1.11 mg/dL (ref 0.60–1.30)
Calcium, Total: 7.8 mg/dL — ABNORMAL LOW (ref 8.5–10.1)
Chloride: 114 mmol/L — ABNORMAL HIGH (ref 98–107)
Co2: 21 mmol/L (ref 21–32)
EGFR (Non-African Amer.): 46 — ABNORMAL LOW
GFR CALC AF AMER: 54 — AB
Glucose: 101 mg/dL — ABNORMAL HIGH (ref 65–99)
Osmolality: 276 (ref 275–301)
POTASSIUM: 3.2 mmol/L — AB (ref 3.5–5.1)
SODIUM: 138 mmol/L (ref 136–145)

## 2013-09-12 LAB — PROTIME-INR
INR: 4.8
PROTHROMBIN TIME: 43.4 s — AB (ref 11.5–14.7)

## 2013-09-13 LAB — PROTIME-INR
INR: 2.5
PROTHROMBIN TIME: 26.2 s — AB (ref 11.5–14.7)

## 2013-09-14 LAB — COMPREHENSIVE METABOLIC PANEL
ALBUMIN: 1.9 g/dL — AB (ref 3.4–5.0)
ANION GAP: 9 (ref 7–16)
AST: 141 U/L — AB (ref 15–37)
Alkaline Phosphatase: 71 U/L
BUN: 10 mg/dL (ref 7–18)
Bilirubin,Total: 0.3 mg/dL (ref 0.2–1.0)
CALCIUM: 8 mg/dL — AB (ref 8.5–10.1)
CO2: 24 mmol/L (ref 21–32)
Chloride: 107 mmol/L (ref 98–107)
Creatinine: 1.11 mg/dL (ref 0.60–1.30)
EGFR (African American): 54 — ABNORMAL LOW
GFR CALC NON AF AMER: 46 — AB
GLUCOSE: 98 mg/dL (ref 65–99)
OSMOLALITY: 278 (ref 275–301)
Potassium: 3.3 mmol/L — ABNORMAL LOW (ref 3.5–5.1)
SGPT (ALT): 164 U/L — ABNORMAL HIGH (ref 12–78)
Sodium: 140 mmol/L (ref 136–145)
TOTAL PROTEIN: 4.9 g/dL — AB (ref 6.4–8.2)

## 2013-09-14 LAB — MAGNESIUM: Magnesium: 1.1 mg/dL — ABNORMAL LOW

## 2013-09-14 LAB — PROTIME-INR
INR: 1.7
PROTHROMBIN TIME: 19.4 s — AB (ref 11.5–14.7)

## 2013-09-15 LAB — PROTIME-INR
INR: 1.4
Prothrombin Time: 17.3 secs — ABNORMAL HIGH (ref 11.5–14.7)

## 2013-09-15 LAB — HEMOGLOBIN: HGB: 8.2 g/dL — AB (ref 12.0–16.0)

## 2013-09-16 DIAGNOSIS — E119 Type 2 diabetes mellitus without complications: Secondary | ICD-10-CM | POA: Diagnosis not present

## 2013-09-16 DIAGNOSIS — Z5189 Encounter for other specified aftercare: Secondary | ICD-10-CM | POA: Diagnosis not present

## 2013-09-16 DIAGNOSIS — N390 Urinary tract infection, site not specified: Secondary | ICD-10-CM | POA: Diagnosis not present

## 2013-09-16 DIAGNOSIS — R1310 Dysphagia, unspecified: Secondary | ICD-10-CM | POA: Diagnosis not present

## 2013-09-16 DIAGNOSIS — M1710 Unilateral primary osteoarthritis, unspecified knee: Secondary | ICD-10-CM | POA: Diagnosis not present

## 2013-09-16 DIAGNOSIS — I2510 Atherosclerotic heart disease of native coronary artery without angina pectoris: Secondary | ICD-10-CM | POA: Diagnosis not present

## 2013-09-16 DIAGNOSIS — I429 Cardiomyopathy, unspecified: Secondary | ICD-10-CM | POA: Diagnosis not present

## 2013-09-16 DIAGNOSIS — N2581 Secondary hyperparathyroidism of renal origin: Secondary | ICD-10-CM | POA: Diagnosis not present

## 2013-09-16 DIAGNOSIS — N19 Unspecified kidney failure: Secondary | ICD-10-CM | POA: Diagnosis not present

## 2013-09-16 DIAGNOSIS — I2589 Other forms of chronic ischemic heart disease: Secondary | ICD-10-CM | POA: Diagnosis not present

## 2013-09-16 DIAGNOSIS — N183 Chronic kidney disease, stage 3 unspecified: Secondary | ICD-10-CM | POA: Diagnosis not present

## 2013-09-16 DIAGNOSIS — R141 Gas pain: Secondary | ICD-10-CM | POA: Diagnosis not present

## 2013-09-16 DIAGNOSIS — R5383 Other fatigue: Secondary | ICD-10-CM | POA: Diagnosis not present

## 2013-09-16 DIAGNOSIS — K560 Paralytic ileus: Secondary | ICD-10-CM | POA: Diagnosis not present

## 2013-09-16 DIAGNOSIS — Z7901 Long term (current) use of anticoagulants: Secondary | ICD-10-CM | POA: Diagnosis not present

## 2013-09-16 DIAGNOSIS — I10 Essential (primary) hypertension: Secondary | ICD-10-CM | POA: Diagnosis not present

## 2013-09-16 DIAGNOSIS — R5381 Other malaise: Secondary | ICD-10-CM | POA: Diagnosis not present

## 2013-09-16 DIAGNOSIS — K449 Diaphragmatic hernia without obstruction or gangrene: Secondary | ICD-10-CM | POA: Diagnosis not present

## 2013-09-16 DIAGNOSIS — R609 Edema, unspecified: Secondary | ICD-10-CM | POA: Diagnosis not present

## 2013-09-16 DIAGNOSIS — I4891 Unspecified atrial fibrillation: Secondary | ICD-10-CM | POA: Diagnosis not present

## 2013-09-16 DIAGNOSIS — I5022 Chronic systolic (congestive) heart failure: Secondary | ICD-10-CM | POA: Diagnosis not present

## 2013-09-16 DIAGNOSIS — R079 Chest pain, unspecified: Secondary | ICD-10-CM | POA: Diagnosis not present

## 2013-09-16 DIAGNOSIS — I509 Heart failure, unspecified: Secondary | ICD-10-CM | POA: Diagnosis not present

## 2013-09-16 DIAGNOSIS — E785 Hyperlipidemia, unspecified: Secondary | ICD-10-CM | POA: Diagnosis not present

## 2013-09-16 DIAGNOSIS — N179 Acute kidney failure, unspecified: Secondary | ICD-10-CM | POA: Diagnosis not present

## 2013-09-16 DIAGNOSIS — N289 Disorder of kidney and ureter, unspecified: Secondary | ICD-10-CM | POA: Diagnosis not present

## 2013-09-16 DIAGNOSIS — Z515 Encounter for palliative care: Secondary | ICD-10-CM | POA: Diagnosis not present

## 2013-09-16 DIAGNOSIS — R142 Eructation: Secondary | ICD-10-CM | POA: Diagnosis not present

## 2013-09-16 DIAGNOSIS — I252 Old myocardial infarction: Secondary | ICD-10-CM | POA: Diagnosis not present

## 2013-09-16 DIAGNOSIS — J90 Pleural effusion, not elsewhere classified: Secondary | ICD-10-CM | POA: Diagnosis not present

## 2013-09-16 DIAGNOSIS — E876 Hypokalemia: Secondary | ICD-10-CM | POA: Diagnosis not present

## 2013-09-16 DIAGNOSIS — I25810 Atherosclerosis of coronary artery bypass graft(s) without angina pectoris: Secondary | ICD-10-CM | POA: Diagnosis not present

## 2013-09-16 DIAGNOSIS — D649 Anemia, unspecified: Secondary | ICD-10-CM | POA: Diagnosis not present

## 2013-09-16 DIAGNOSIS — R9389 Abnormal findings on diagnostic imaging of other specified body structures: Secondary | ICD-10-CM | POA: Diagnosis not present

## 2013-09-16 DIAGNOSIS — Z951 Presence of aortocoronary bypass graft: Secondary | ICD-10-CM | POA: Diagnosis not present

## 2013-09-16 DIAGNOSIS — M6281 Muscle weakness (generalized): Secondary | ICD-10-CM | POA: Diagnosis not present

## 2013-09-16 LAB — BASIC METABOLIC PANEL
ANION GAP: 8 (ref 7–16)
BUN: 8 mg/dL (ref 7–18)
CHLORIDE: 106 mmol/L (ref 98–107)
CO2: 27 mmol/L (ref 21–32)
Calcium, Total: 8.3 mg/dL — ABNORMAL LOW (ref 8.5–10.1)
Creatinine: 1.11 mg/dL (ref 0.60–1.30)
EGFR (African American): 54 — ABNORMAL LOW
EGFR (Non-African Amer.): 46 — ABNORMAL LOW
Glucose: 96 mg/dL (ref 65–99)
Osmolality: 279 (ref 275–301)
Potassium: 3.9 mmol/L (ref 3.5–5.1)
Sodium: 141 mmol/L (ref 136–145)

## 2013-09-16 LAB — PROTIME-INR
INR: 1.3
Prothrombin Time: 16 secs — ABNORMAL HIGH (ref 11.5–14.7)

## 2013-09-16 LAB — MAGNESIUM: MAGNESIUM: 1.8 mg/dL

## 2013-09-17 ENCOUNTER — Encounter: Payer: Self-pay | Admitting: Internal Medicine

## 2013-09-17 DIAGNOSIS — I4891 Unspecified atrial fibrillation: Secondary | ICD-10-CM | POA: Diagnosis not present

## 2013-09-17 DIAGNOSIS — N179 Acute kidney failure, unspecified: Secondary | ICD-10-CM | POA: Diagnosis not present

## 2013-09-19 LAB — URINALYSIS, COMPLETE
Bilirubin,UR: NEGATIVE
Glucose,UR: NEGATIVE mg/dL (ref 0–75)
Ketone: NEGATIVE
Nitrite: NEGATIVE
Ph: 8 (ref 4.5–8.0)
Protein: 30
Specific Gravity: 1.009 (ref 1.003–1.030)
WBC UR: 673 /HPF (ref 0–5)

## 2013-09-21 LAB — URINE CULTURE

## 2013-09-22 LAB — PROTIME-INR
INR: 2
Prothrombin Time: 22.4 secs — ABNORMAL HIGH (ref 11.5–14.7)

## 2013-09-23 ENCOUNTER — Ambulatory Visit: Payer: Self-pay | Admitting: Gerontology

## 2013-09-23 DIAGNOSIS — R141 Gas pain: Secondary | ICD-10-CM | POA: Diagnosis not present

## 2013-09-23 DIAGNOSIS — R079 Chest pain, unspecified: Secondary | ICD-10-CM | POA: Diagnosis not present

## 2013-09-23 LAB — BASIC METABOLIC PANEL
ANION GAP: 8 (ref 7–16)
BUN: 17 mg/dL (ref 7–18)
CREATININE: 1.6 mg/dL — AB (ref 0.60–1.30)
Calcium, Total: 8.6 mg/dL (ref 8.5–10.1)
Chloride: 106 mmol/L (ref 98–107)
Co2: 22 mmol/L (ref 21–32)
EGFR (African American): 34 — ABNORMAL LOW
GFR CALC NON AF AMER: 30 — AB
Glucose: 71 mg/dL (ref 65–99)
Osmolality: 272 (ref 275–301)
POTASSIUM: 5.1 mmol/L (ref 3.5–5.1)
SODIUM: 136 mmol/L (ref 136–145)

## 2013-09-23 LAB — CBC WITH DIFFERENTIAL/PLATELET
Basophil #: 0.1 10*3/uL (ref 0.0–0.1)
Basophil %: 0.9 %
EOS ABS: 0.2 10*3/uL (ref 0.0–0.7)
Eosinophil %: 2.5 %
HCT: 27.7 % — AB (ref 35.0–47.0)
HGB: 8.8 g/dL — ABNORMAL LOW (ref 12.0–16.0)
LYMPHS PCT: 22.8 %
Lymphocyte #: 1.5 10*3/uL (ref 1.0–3.6)
MCH: 30.7 pg (ref 26.0–34.0)
MCHC: 31.8 g/dL — ABNORMAL LOW (ref 32.0–36.0)
MCV: 97 fL (ref 80–100)
MONO ABS: 0.9 x10 3/mm (ref 0.2–0.9)
MONOS PCT: 12.8 %
Neutrophil #: 4.1 10*3/uL (ref 1.4–6.5)
Neutrophil %: 61 %
Platelet: 183 10*3/uL (ref 150–440)
RBC: 2.87 10*6/uL — ABNORMAL LOW (ref 3.80–5.20)
RDW: 16.7 % — AB (ref 11.5–14.5)
WBC: 6.8 10*3/uL (ref 3.6–11.0)

## 2013-09-23 LAB — PROTIME-INR
INR: 3
Prothrombin Time: 30.5 secs — ABNORMAL HIGH (ref 11.5–14.7)

## 2013-09-24 ENCOUNTER — Ambulatory Visit: Payer: Self-pay | Admitting: Gerontology

## 2013-09-24 DIAGNOSIS — K449 Diaphragmatic hernia without obstruction or gangrene: Secondary | ICD-10-CM | POA: Diagnosis not present

## 2013-09-25 LAB — PROTIME-INR
INR: 5
Prothrombin Time: 45 secs — ABNORMAL HIGH (ref 11.5–14.7)

## 2013-09-26 DIAGNOSIS — I2589 Other forms of chronic ischemic heart disease: Secondary | ICD-10-CM | POA: Diagnosis not present

## 2013-09-26 DIAGNOSIS — I25810 Atherosclerosis of coronary artery bypass graft(s) without angina pectoris: Secondary | ICD-10-CM | POA: Diagnosis not present

## 2013-09-26 DIAGNOSIS — I2510 Atherosclerotic heart disease of native coronary artery without angina pectoris: Secondary | ICD-10-CM | POA: Diagnosis not present

## 2013-09-26 DIAGNOSIS — I429 Cardiomyopathy, unspecified: Secondary | ICD-10-CM | POA: Diagnosis not present

## 2013-09-26 DIAGNOSIS — I4891 Unspecified atrial fibrillation: Secondary | ICD-10-CM | POA: Diagnosis not present

## 2013-09-26 DIAGNOSIS — I10 Essential (primary) hypertension: Secondary | ICD-10-CM | POA: Diagnosis not present

## 2013-09-26 LAB — PROTIME-INR
INR: 2.7
Prothrombin Time: 27.7 secs — ABNORMAL HIGH (ref 11.5–14.7)

## 2013-09-27 ENCOUNTER — Ambulatory Visit: Payer: Self-pay | Admitting: Hospice and Palliative Medicine

## 2013-09-27 ENCOUNTER — Encounter: Payer: Self-pay | Admitting: Internal Medicine

## 2013-09-27 ENCOUNTER — Ambulatory Visit
Admit: 2013-09-27 | Disposition: A | Discharge status: Home / Self Care | Payer: Self-pay | Attending: Nurse Practitioner | Admitting: Nurse Practitioner

## 2013-09-28 LAB — PROTIME-INR
INR: 2.5
Prothrombin Time: 26.3 secs — ABNORMAL HIGH (ref 11.5–14.7)

## 2013-09-30 DIAGNOSIS — N179 Acute kidney failure, unspecified: Secondary | ICD-10-CM | POA: Diagnosis not present

## 2013-09-30 DIAGNOSIS — I10 Essential (primary) hypertension: Secondary | ICD-10-CM | POA: Diagnosis not present

## 2013-09-30 DIAGNOSIS — N183 Chronic kidney disease, stage 3 unspecified: Secondary | ICD-10-CM | POA: Diagnosis not present

## 2013-09-30 DIAGNOSIS — D649 Anemia, unspecified: Secondary | ICD-10-CM | POA: Diagnosis not present

## 2013-09-30 LAB — PROTIME-INR
INR: 2.3
PROTHROMBIN TIME: 24.7 s — AB (ref 11.5–14.7)

## 2013-10-07 LAB — PROTIME-INR
INR: 1.5
PROTHROMBIN TIME: 18.1 s — AB (ref 11.5–14.7)

## 2013-10-08 LAB — URINALYSIS, COMPLETE
BACTERIA: NONE SEEN
BILIRUBIN, UR: NEGATIVE
Blood: NEGATIVE
GLUCOSE, UR: NEGATIVE mg/dL (ref 0–75)
Ketone: NEGATIVE
NITRITE: NEGATIVE
Ph: 7 (ref 4.5–8.0)
SPECIFIC GRAVITY: 1.016 (ref 1.003–1.030)
Squamous Epithelial: 6

## 2013-10-13 LAB — URINE CULTURE

## 2013-10-14 LAB — PROTIME-INR
INR: 1.8
PROTHROMBIN TIME: 20.3 s — AB (ref 11.5–14.7)

## 2013-10-15 ENCOUNTER — Ambulatory Visit: Payer: Self-pay | Admitting: Nephrology

## 2013-10-15 DIAGNOSIS — N289 Disorder of kidney and ureter, unspecified: Secondary | ICD-10-CM | POA: Diagnosis not present

## 2013-10-17 DIAGNOSIS — R609 Edema, unspecified: Secondary | ICD-10-CM | POA: Diagnosis not present

## 2013-10-17 DIAGNOSIS — I10 Essential (primary) hypertension: Secondary | ICD-10-CM | POA: Diagnosis not present

## 2013-10-17 DIAGNOSIS — N2581 Secondary hyperparathyroidism of renal origin: Secondary | ICD-10-CM | POA: Diagnosis not present

## 2013-10-17 DIAGNOSIS — N183 Chronic kidney disease, stage 3 unspecified: Secondary | ICD-10-CM | POA: Diagnosis not present

## 2013-10-17 DIAGNOSIS — N179 Acute kidney failure, unspecified: Secondary | ICD-10-CM | POA: Diagnosis not present

## 2013-10-21 LAB — PROTIME-INR
INR: 1.6
Prothrombin Time: 18.7 secs — ABNORMAL HIGH (ref 11.5–14.7)

## 2013-10-24 DIAGNOSIS — E785 Hyperlipidemia, unspecified: Secondary | ICD-10-CM | POA: Diagnosis not present

## 2013-10-24 DIAGNOSIS — I4891 Unspecified atrial fibrillation: Secondary | ICD-10-CM | POA: Diagnosis not present

## 2013-10-24 DIAGNOSIS — I10 Essential (primary) hypertension: Secondary | ICD-10-CM | POA: Diagnosis not present

## 2013-10-24 DIAGNOSIS — I25810 Atherosclerosis of coronary artery bypass graft(s) without angina pectoris: Secondary | ICD-10-CM | POA: Diagnosis not present

## 2013-10-24 DIAGNOSIS — I2510 Atherosclerotic heart disease of native coronary artery without angina pectoris: Secondary | ICD-10-CM | POA: Diagnosis not present

## 2013-10-28 ENCOUNTER — Ambulatory Visit
Admit: 2013-10-28 | Disposition: A | Discharge status: Home / Self Care | Payer: Self-pay | Attending: Nurse Practitioner | Admitting: Nurse Practitioner

## 2013-10-28 ENCOUNTER — Encounter: Payer: Self-pay | Admitting: Internal Medicine

## 2013-10-28 DIAGNOSIS — M1710 Unilateral primary osteoarthritis, unspecified knee: Secondary | ICD-10-CM | POA: Diagnosis not present

## 2013-10-28 LAB — PROTIME-INR
INR: 1.7
Prothrombin Time: 19.6 secs — ABNORMAL HIGH (ref 11.5–14.7)

## 2013-10-31 DIAGNOSIS — I4891 Unspecified atrial fibrillation: Secondary | ICD-10-CM | POA: Diagnosis not present

## 2013-10-31 DIAGNOSIS — I10 Essential (primary) hypertension: Secondary | ICD-10-CM | POA: Diagnosis not present

## 2013-10-31 DIAGNOSIS — I25810 Atherosclerosis of coronary artery bypass graft(s) without angina pectoris: Secondary | ICD-10-CM | POA: Diagnosis not present

## 2013-10-31 DIAGNOSIS — I509 Heart failure, unspecified: Secondary | ICD-10-CM | POA: Diagnosis not present

## 2013-10-31 DIAGNOSIS — E785 Hyperlipidemia, unspecified: Secondary | ICD-10-CM | POA: Diagnosis not present

## 2013-10-31 DIAGNOSIS — I2510 Atherosclerotic heart disease of native coronary artery without angina pectoris: Secondary | ICD-10-CM | POA: Diagnosis not present

## 2013-11-04 DIAGNOSIS — I4891 Unspecified atrial fibrillation: Secondary | ICD-10-CM | POA: Diagnosis not present

## 2013-11-04 LAB — PROTIME-INR
INR: 1.5
PROTHROMBIN TIME: 17.4 s — AB (ref 11.5–14.7)

## 2013-11-07 DIAGNOSIS — Z23 Encounter for immunization: Secondary | ICD-10-CM | POA: Diagnosis not present

## 2013-11-11 DIAGNOSIS — I4891 Unspecified atrial fibrillation: Secondary | ICD-10-CM | POA: Diagnosis not present

## 2013-11-11 LAB — PROTIME-INR
INR: 1.6
PROTHROMBIN TIME: 18.7 s — AB (ref 11.5–14.7)

## 2013-11-14 DIAGNOSIS — I10 Essential (primary) hypertension: Secondary | ICD-10-CM | POA: Diagnosis not present

## 2013-11-14 DIAGNOSIS — E119 Type 2 diabetes mellitus without complications: Secondary | ICD-10-CM | POA: Diagnosis not present

## 2013-11-14 DIAGNOSIS — I2510 Atherosclerotic heart disease of native coronary artery without angina pectoris: Secondary | ICD-10-CM | POA: Diagnosis not present

## 2013-11-18 DIAGNOSIS — I4891 Unspecified atrial fibrillation: Secondary | ICD-10-CM | POA: Diagnosis not present

## 2013-11-18 LAB — PROTIME-INR
INR: 1.8
PROTHROMBIN TIME: 20.6 s — AB (ref 11.5–14.7)

## 2013-11-25 DIAGNOSIS — E119 Type 2 diabetes mellitus without complications: Secondary | ICD-10-CM | POA: Diagnosis not present

## 2013-11-25 DIAGNOSIS — I4891 Unspecified atrial fibrillation: Secondary | ICD-10-CM | POA: Diagnosis not present

## 2013-11-25 DIAGNOSIS — I10 Essential (primary) hypertension: Secondary | ICD-10-CM | POA: Diagnosis not present

## 2013-11-25 DIAGNOSIS — I2510 Atherosclerotic heart disease of native coronary artery without angina pectoris: Secondary | ICD-10-CM | POA: Diagnosis not present

## 2013-11-25 LAB — PROTIME-INR
INR: 1.9
PROTHROMBIN TIME: 21.6 s — AB (ref 11.5–14.7)

## 2013-11-27 ENCOUNTER — Encounter: Payer: Self-pay | Admitting: Internal Medicine

## 2013-11-27 DIAGNOSIS — R410 Disorientation, unspecified: Secondary | ICD-10-CM | POA: Diagnosis not present

## 2013-11-27 DIAGNOSIS — I4891 Unspecified atrial fibrillation: Secondary | ICD-10-CM | POA: Diagnosis not present

## 2013-11-28 DIAGNOSIS — I2510 Atherosclerotic heart disease of native coronary artery without angina pectoris: Secondary | ICD-10-CM | POA: Diagnosis not present

## 2013-11-28 DIAGNOSIS — I351 Nonrheumatic aortic (valve) insufficiency: Secondary | ICD-10-CM | POA: Diagnosis not present

## 2013-11-28 DIAGNOSIS — I429 Cardiomyopathy, unspecified: Secondary | ICD-10-CM | POA: Diagnosis not present

## 2013-11-28 DIAGNOSIS — I10 Essential (primary) hypertension: Secondary | ICD-10-CM | POA: Diagnosis not present

## 2013-11-28 DIAGNOSIS — I25810 Atherosclerosis of coronary artery bypass graft(s) without angina pectoris: Secondary | ICD-10-CM | POA: Diagnosis not present

## 2013-11-28 DIAGNOSIS — I340 Nonrheumatic mitral (valve) insufficiency: Secondary | ICD-10-CM | POA: Diagnosis not present

## 2013-11-28 DIAGNOSIS — E785 Hyperlipidemia, unspecified: Secondary | ICD-10-CM | POA: Diagnosis not present

## 2013-12-02 LAB — PROTIME-INR
INR: 3
Prothrombin Time: 30.3 secs — ABNORMAL HIGH (ref 11.5–14.7)

## 2013-12-04 LAB — PROTIME-INR
INR: 3.7
Prothrombin Time: 35.4 secs — ABNORMAL HIGH (ref 11.5–14.7)

## 2013-12-08 LAB — URINALYSIS, COMPLETE
BLOOD: NEGATIVE
Bilirubin,UR: NEGATIVE
Glucose,UR: NEGATIVE mg/dL (ref 0–75)
KETONE: NEGATIVE
NITRITE: NEGATIVE
PH: 7 (ref 4.5–8.0)
Protein: 100
RBC,UR: 1 /HPF (ref 0–5)
Specific Gravity: 1.015 (ref 1.003–1.030)
WBC UR: 8 /HPF (ref 0–5)

## 2013-12-09 LAB — PROTIME-INR
INR: 2.7
Prothrombin Time: 28 secs — ABNORMAL HIGH (ref 11.5–14.7)

## 2013-12-09 LAB — URINE CULTURE

## 2013-12-13 LAB — URINALYSIS, COMPLETE
Bilirubin,UR: NEGATIVE
Blood: NEGATIVE
GLUCOSE, UR: NEGATIVE mg/dL (ref 0–75)
KETONE: NEGATIVE
Nitrite: NEGATIVE
PH: 7 (ref 4.5–8.0)
Protein: 30
RBC,UR: 1 /HPF (ref 0–5)
Specific Gravity: 1.009 (ref 1.003–1.030)
Squamous Epithelial: NONE SEEN
WBC UR: 57 /HPF (ref 0–5)

## 2013-12-15 LAB — PROTIME-INR
INR: 3.9
PROTHROMBIN TIME: 36.8 s — AB (ref 11.5–14.7)

## 2013-12-15 LAB — URINE CULTURE

## 2013-12-16 LAB — PROTIME-INR
INR: 4.4
PROTHROMBIN TIME: 40.4 s — AB (ref 11.5–14.7)

## 2013-12-17 LAB — PROTIME-INR
INR: 4.1
Prothrombin Time: 38.4 secs — ABNORMAL HIGH (ref 11.5–14.7)

## 2013-12-18 LAB — PROTIME-INR
INR: 3.3
Prothrombin Time: 32.7 secs — ABNORMAL HIGH (ref 11.5–14.7)

## 2013-12-20 LAB — PROTIME-INR
INR: 2.6
Prothrombin Time: 27.5 secs — ABNORMAL HIGH (ref 11.5–14.7)

## 2013-12-23 LAB — PROTIME-INR
INR: 3.2
Prothrombin Time: 31.5 secs — ABNORMAL HIGH (ref 11.5–14.7)

## 2013-12-28 ENCOUNTER — Encounter: Payer: Self-pay | Admitting: Internal Medicine

## 2013-12-28 DIAGNOSIS — N183 Chronic kidney disease, stage 3 (moderate): Secondary | ICD-10-CM | POA: Diagnosis not present

## 2013-12-28 DIAGNOSIS — I4891 Unspecified atrial fibrillation: Secondary | ICD-10-CM | POA: Diagnosis not present

## 2013-12-28 DIAGNOSIS — E1122 Type 2 diabetes mellitus with diabetic chronic kidney disease: Secondary | ICD-10-CM | POA: Diagnosis not present

## 2013-12-28 DIAGNOSIS — I10 Essential (primary) hypertension: Secondary | ICD-10-CM | POA: Diagnosis not present

## 2013-12-30 LAB — PROTIME-INR
INR: 4.4
PROTHROMBIN TIME: 40.4 s — AB (ref 11.5–14.7)

## 2014-01-06 LAB — PROTIME-INR
INR: 3
PROTHROMBIN TIME: 30.6 s — AB (ref 11.5–14.7)

## 2014-01-16 DIAGNOSIS — E1122 Type 2 diabetes mellitus with diabetic chronic kidney disease: Secondary | ICD-10-CM | POA: Diagnosis not present

## 2014-01-16 DIAGNOSIS — N183 Chronic kidney disease, stage 3 (moderate): Secondary | ICD-10-CM | POA: Diagnosis not present

## 2014-01-16 DIAGNOSIS — D631 Anemia in chronic kidney disease: Secondary | ICD-10-CM | POA: Diagnosis not present

## 2014-01-16 DIAGNOSIS — I10 Essential (primary) hypertension: Secondary | ICD-10-CM | POA: Diagnosis not present

## 2014-01-16 DIAGNOSIS — N2581 Secondary hyperparathyroidism of renal origin: Secondary | ICD-10-CM | POA: Diagnosis not present

## 2014-01-16 LAB — URINALYSIS, COMPLETE
Bilirubin,UR: NEGATIVE
Blood: NEGATIVE
Glucose,UR: NEGATIVE mg/dL (ref 0–75)
KETONE: NEGATIVE
Nitrite: NEGATIVE
Ph: 7 (ref 4.5–8.0)
RBC,UR: 5 /HPF (ref 0–5)
SPECIFIC GRAVITY: 1.013 (ref 1.003–1.030)
WBC UR: 179 /HPF (ref 0–5)

## 2014-01-16 LAB — PROTEIN / CREATININE RATIO, URINE
CREATININE, URINE: 57.6 mg/dL (ref 30.0–125.0)
Protein, Random Urine: 53 mg/dL — ABNORMAL HIGH (ref 0–12)
Protein/Creat. Ratio: 920 mg/gCREAT — ABNORMAL HIGH (ref 0–200)

## 2014-01-21 DIAGNOSIS — Z23 Encounter for immunization: Secondary | ICD-10-CM | POA: Diagnosis not present

## 2014-01-27 ENCOUNTER — Encounter: Payer: Self-pay | Admitting: Internal Medicine

## 2014-02-03 ENCOUNTER — Ambulatory Visit: Payer: Self-pay | Admitting: Hematology and Oncology

## 2014-02-03 DIAGNOSIS — Z79899 Other long term (current) drug therapy: Secondary | ICD-10-CM | POA: Diagnosis not present

## 2014-02-03 DIAGNOSIS — I129 Hypertensive chronic kidney disease with stage 1 through stage 4 chronic kidney disease, or unspecified chronic kidney disease: Secondary | ICD-10-CM | POA: Diagnosis not present

## 2014-02-03 DIAGNOSIS — Z7901 Long term (current) use of anticoagulants: Secondary | ICD-10-CM | POA: Diagnosis not present

## 2014-02-03 DIAGNOSIS — N189 Chronic kidney disease, unspecified: Secondary | ICD-10-CM | POA: Diagnosis not present

## 2014-02-03 DIAGNOSIS — I4891 Unspecified atrial fibrillation: Secondary | ICD-10-CM | POA: Diagnosis not present

## 2014-02-03 DIAGNOSIS — D649 Anemia, unspecified: Secondary | ICD-10-CM | POA: Diagnosis not present

## 2014-02-03 DIAGNOSIS — E119 Type 2 diabetes mellitus without complications: Secondary | ICD-10-CM | POA: Diagnosis not present

## 2014-02-03 LAB — CBC CANCER CENTER
BASOS PCT: 0.7 %
Basophil #: 0.1 x10 3/mm (ref 0.0–0.1)
EOS PCT: 3 %
Eosinophil #: 0.2 x10 3/mm (ref 0.0–0.7)
HCT: 27.7 % — AB (ref 35.0–47.0)
HGB: 8.9 g/dL — ABNORMAL LOW (ref 12.0–16.0)
LYMPHS ABS: 1.6 x10 3/mm (ref 1.0–3.6)
LYMPHS PCT: 21.7 %
MCH: 28.7 pg (ref 26.0–34.0)
MCHC: 32.4 g/dL (ref 32.0–36.0)
MCV: 89 fL (ref 80–100)
MONO ABS: 0.5 x10 3/mm (ref 0.2–0.9)
Monocyte %: 6.4 %
NEUTROS ABS: 5.1 x10 3/mm (ref 1.4–6.5)
Neutrophil %: 68.2 %
PLATELETS: 185 x10 3/mm (ref 150–440)
RBC: 3.12 10*6/uL — AB (ref 3.80–5.20)
RDW: 15.7 % — ABNORMAL HIGH (ref 11.5–14.5)
WBC: 7.5 x10 3/mm (ref 3.6–11.0)

## 2014-02-03 LAB — FERRITIN: Ferritin (ARMC): 29 ng/mL (ref 8–388)

## 2014-02-03 LAB — COMPREHENSIVE METABOLIC PANEL
ALK PHOS: 70 U/L
ALT: 26 U/L
Albumin: 2.9 g/dL — ABNORMAL LOW (ref 3.4–5.0)
Anion Gap: 8 (ref 7–16)
BUN: 21 mg/dL — ABNORMAL HIGH (ref 7–18)
Bilirubin,Total: 0.4 mg/dL (ref 0.2–1.0)
Calcium, Total: 9 mg/dL (ref 8.5–10.1)
Chloride: 103 mmol/L (ref 98–107)
Co2: 25 mmol/L (ref 21–32)
Creatinine: 1.57 mg/dL — ABNORMAL HIGH (ref 0.60–1.30)
EGFR (African American): 41 — ABNORMAL LOW
EGFR (Non-African Amer.): 33 — ABNORMAL LOW
GLUCOSE: 98 mg/dL (ref 65–99)
Osmolality: 275 (ref 275–301)
POTASSIUM: 4.4 mmol/L (ref 3.5–5.1)
SGOT(AST): 27 U/L (ref 15–37)
Sodium: 136 mmol/L (ref 136–145)
Total Protein: 6.5 g/dL (ref 6.4–8.2)

## 2014-02-03 LAB — IRON AND TIBC
IRON SATURATION: 10 %
IRON: 40 ug/dL — AB (ref 50–170)
Iron Bind.Cap.(Total): 386 ug/dL (ref 250–450)
UNBOUND IRON-BIND. CAP.: 346 ug/dL

## 2014-02-03 LAB — PROTIME-INR
INR: 5.1
PROTHROMBIN TIME: 45.3 s — AB (ref 11.5–14.7)

## 2014-02-03 LAB — RETICULOCYTES
Absolute Retic Count: 0.0379 10*6/uL (ref 0.019–0.186)
RETICULOCYTE: 1.22 % (ref 0.4–3.1)

## 2014-02-03 LAB — FOLATE: Folic Acid: 9.8 ng/mL (ref 3.1–17.5)

## 2014-02-04 DIAGNOSIS — I4891 Unspecified atrial fibrillation: Secondary | ICD-10-CM | POA: Diagnosis not present

## 2014-02-04 LAB — PROT IMMUNOELECTROPHORES(ARMC)

## 2014-02-05 DIAGNOSIS — I255 Ischemic cardiomyopathy: Secondary | ICD-10-CM | POA: Diagnosis not present

## 2014-02-05 DIAGNOSIS — I4891 Unspecified atrial fibrillation: Secondary | ICD-10-CM | POA: Diagnosis not present

## 2014-02-05 DIAGNOSIS — E1122 Type 2 diabetes mellitus with diabetic chronic kidney disease: Secondary | ICD-10-CM | POA: Diagnosis not present

## 2014-02-05 LAB — PROTIME-INR
INR: 2
PROTHROMBIN TIME: 21.9 s — AB (ref 11.5–14.7)

## 2014-02-10 DIAGNOSIS — I4891 Unspecified atrial fibrillation: Secondary | ICD-10-CM | POA: Diagnosis not present

## 2014-02-10 LAB — PROTIME-INR
INR: 1.9
Prothrombin Time: 21.4 secs — ABNORMAL HIGH (ref 11.5–14.7)

## 2014-02-12 DIAGNOSIS — I4891 Unspecified atrial fibrillation: Secondary | ICD-10-CM | POA: Diagnosis not present

## 2014-02-12 LAB — PROTIME-INR
INR: 2.3
PROTHROMBIN TIME: 25.1 s — AB (ref 11.5–14.7)

## 2014-02-17 DIAGNOSIS — N2581 Secondary hyperparathyroidism of renal origin: Secondary | ICD-10-CM | POA: Diagnosis not present

## 2014-02-17 DIAGNOSIS — N183 Chronic kidney disease, stage 3 (moderate): Secondary | ICD-10-CM | POA: Diagnosis not present

## 2014-02-17 DIAGNOSIS — D631 Anemia in chronic kidney disease: Secondary | ICD-10-CM | POA: Diagnosis not present

## 2014-02-17 DIAGNOSIS — I10 Essential (primary) hypertension: Secondary | ICD-10-CM | POA: Diagnosis not present

## 2014-02-18 DIAGNOSIS — I4891 Unspecified atrial fibrillation: Secondary | ICD-10-CM | POA: Diagnosis not present

## 2014-02-18 LAB — PROTIME-INR
INR: 2.6
Prothrombin Time: 27 secs — ABNORMAL HIGH (ref 11.5–14.7)

## 2014-02-24 DIAGNOSIS — I4891 Unspecified atrial fibrillation: Secondary | ICD-10-CM | POA: Diagnosis not present

## 2014-02-24 LAB — PROTIME-INR
INR: 3.2
PROTHROMBIN TIME: 31.9 s — AB (ref 11.5–14.7)

## 2014-02-27 ENCOUNTER — Encounter: Payer: Self-pay | Admitting: Internal Medicine

## 2014-02-27 ENCOUNTER — Ambulatory Visit: Payer: Self-pay | Admitting: Hematology and Oncology

## 2014-02-27 DIAGNOSIS — I4891 Unspecified atrial fibrillation: Secondary | ICD-10-CM | POA: Diagnosis not present

## 2014-02-28 LAB — PROTIME-INR
INR: 2.4
PROTHROMBIN TIME: 25.3 s — AB (ref 11.5–14.7)

## 2014-03-03 LAB — PROTIME-INR
INR: 2.4
Prothrombin Time: 25.8 secs — ABNORMAL HIGH (ref 11.5–14.7)

## 2014-03-06 DIAGNOSIS — I10 Essential (primary) hypertension: Secondary | ICD-10-CM | POA: Diagnosis not present

## 2014-03-06 DIAGNOSIS — I4891 Unspecified atrial fibrillation: Secondary | ICD-10-CM | POA: Diagnosis not present

## 2014-03-06 DIAGNOSIS — E785 Hyperlipidemia, unspecified: Secondary | ICD-10-CM | POA: Diagnosis not present

## 2014-03-06 DIAGNOSIS — I25810 Atherosclerosis of coronary artery bypass graft(s) without angina pectoris: Secondary | ICD-10-CM | POA: Diagnosis not present

## 2014-03-06 DIAGNOSIS — I340 Nonrheumatic mitral (valve) insufficiency: Secondary | ICD-10-CM | POA: Diagnosis not present

## 2014-03-06 DIAGNOSIS — I2510 Atherosclerotic heart disease of native coronary artery without angina pectoris: Secondary | ICD-10-CM | POA: Diagnosis not present

## 2014-03-30 ENCOUNTER — Encounter: Payer: Self-pay | Admitting: Internal Medicine

## 2014-04-07 DIAGNOSIS — G43909 Migraine, unspecified, not intractable, without status migrainosus: Secondary | ICD-10-CM | POA: Diagnosis not present

## 2014-04-07 DIAGNOSIS — I4891 Unspecified atrial fibrillation: Secondary | ICD-10-CM | POA: Diagnosis not present

## 2014-04-07 DIAGNOSIS — E1122 Type 2 diabetes mellitus with diabetic chronic kidney disease: Secondary | ICD-10-CM | POA: Diagnosis not present

## 2014-04-28 ENCOUNTER — Encounter
Admit: 2014-04-28 | Disposition: A | Discharge status: Home / Self Care | Payer: Self-pay | Attending: Internal Medicine | Admitting: Internal Medicine

## 2014-05-29 ENCOUNTER — Encounter
Admit: 2014-05-29 | Disposition: A | Discharge status: Home / Self Care | Payer: Self-pay | Attending: Internal Medicine | Admitting: Internal Medicine

## 2014-06-03 DIAGNOSIS — I255 Ischemic cardiomyopathy: Secondary | ICD-10-CM | POA: Diagnosis not present

## 2014-06-03 DIAGNOSIS — I4891 Unspecified atrial fibrillation: Secondary | ICD-10-CM | POA: Diagnosis not present

## 2014-06-03 DIAGNOSIS — E1122 Type 2 diabetes mellitus with diabetic chronic kidney disease: Secondary | ICD-10-CM | POA: Diagnosis not present

## 2014-06-05 DIAGNOSIS — I10 Essential (primary) hypertension: Secondary | ICD-10-CM | POA: Diagnosis not present

## 2014-06-05 DIAGNOSIS — I340 Nonrheumatic mitral (valve) insufficiency: Secondary | ICD-10-CM | POA: Diagnosis not present

## 2014-06-05 DIAGNOSIS — I2510 Atherosclerotic heart disease of native coronary artery without angina pectoris: Secondary | ICD-10-CM | POA: Diagnosis not present

## 2014-06-05 DIAGNOSIS — I4891 Unspecified atrial fibrillation: Secondary | ICD-10-CM | POA: Diagnosis not present

## 2014-06-05 DIAGNOSIS — I25810 Atherosclerosis of coronary artery bypass graft(s) without angina pectoris: Secondary | ICD-10-CM | POA: Diagnosis not present

## 2014-06-05 DIAGNOSIS — E785 Hyperlipidemia, unspecified: Secondary | ICD-10-CM | POA: Diagnosis not present

## 2014-06-20 NOTE — Consult Note (Signed)
Chief Complaint:  Subjective/Chief Complaint Distention worse after rectal tube discontinued.  Pt c/o proctalgia with insertion.  Denies nausea or vomiting.  +anorexia.  Not eating much at all.   VITAL SIGNS/ANCILLARY NOTES: **Vital Signs.:   16-Jul-15 14:15  Vital Signs Type Routine  Temperature Temperature (F) 97.6  Celsius 36.4  Temperature Source oral  Respirations Respirations 18  Systolic BP Systolic BP 604  Diastolic BP (mmHg) Diastolic BP (mmHg) 64  Mean BP 85  Pulse Ox % Pulse Ox % 96  Pulse Ox Activity Level  At rest  Oxygen Delivery Room Air/ 21 %  Telemetry pattern Cardiac Rhythm Normal sinus rhythm; pattern reported by Telemetry Clerk   Brief Assessment:  GEN well developed, no acute distress, A/Ox3.   Cardiac Regular   Respiratory normal resp effort   Gastrointestinal details normal Bowel sounds normal  No rebound tenderness  No gaurding  +Moderate distention.  +mild tenderness entire abdomen. +flexiseal with good soft brown stool output   EXTR negative cyanosis/clubbing, negative edema   Additional Physical Exam Skin: pale, warm, dry Pt on left side.   Lab Results: Routine Chem:  16-Jul-15 05:25   Result Comment PT/INR - RESULTS VERIFIED BY REPEAT TESTING.  - NOTIFIED OF CRITICAL VALUE  - NOTIFIED TERESA TAPP AT 5409 ON  - 09/11/13.Marland KitchenMarland KitchenChannel Islands Beach  - READ-BACK PROCESS PERFORMED.  Result(s) reported on 11 Sep 2013 at [facility]    05:26   Sodium, Serum 144 (Result(s) reported on 11 Sep 2013 at 07:58AM.)  Routine Coag:  16-Jul-15 05:25   Prothrombin  46.2  INR  5.2 (INR reference interval applies to patients on anticoagulant therapy. A single INR therapeutic range for coumarins is not optimal for all indications; however, the suggested range for most indications is 2.0 - 3.0. Exceptions to the INR Reference Range may include: Prosthetic heart valves, acute myocardial infarction, prevention of myocardial infarction, and combinations of aspirin and anticoagulant.  The need for a higher or lower target INR must be assessed individually. Reference: The Pharmacology and Management of the Vitamin K  antagonists: the seventh ACCP Conference on Antithrombotic and Thrombolytic Therapy. WJXBJ.4782 Sept:126 (3suppl): [id number]. A HCT value >55% may artifactually increase the PT.  In one study,  the increase was an average of 25%. Reference:  "Effect on Routine and Special Coagulation Testing Values of Citrate Anticoagulant Adjustment in Patients with High HCT Values." American Journal of Clinical Pathology 2006;126:400-405.)  Routine Hem:  16-Jul-15 05:26   WBC (CBC)  11.2  RBC (CBC)  2.81  Hemoglobin (CBC)  8.7  Hematocrit (CBC)  26.7  Platelet Count (CBC) 194  MCV 95  MCH 30.9  MCHC 32.5  RDW  16.1  Neutrophil % 76.4  Lymphocyte % 14.7  Monocyte % 5.7  Eosinophil % 2.7  Basophil % 0.5  Neutrophil #  8.6  Lymphocyte # 1.6  Monocyte # 0.6  Eosinophil # 0.3  Basophil # 0.1 (Result(s) reported on 11 Sep 2013 at 07:59AM.)   Radiology Results: XRay:    16-Jul-15 07:15, KUB - Kidney Ureter Bladder  KUB - Kidney Ureter Bladder   REASON FOR EXAM:    ileus follow up  COMMENTS:       PROCEDURE: DXR - DXR KIDNEY URETER BLADDER  - Sep 11 2013  7:15AM     CLINICAL DATA:  Abdominal distention.    EXAM:  ABDOMEN - 1 VIEW    COMPARISON:  09/09/2013 and 09/07/2013    FINDINGS:  There continues to be gas-filled loops of small  and large bowel.  Nasogastric tube appears to be removed. Distended colon in the left  abdomen measuring up to 7.6 cm. Previously, the largest colonic loop  measured roughly 6.4 cm. Cannot evaluate for free air on these  supine images.     IMPRESSION:  Persistent dilated loops of small and large bowel. Slightly  increased colonic distention compared to the most recent comparison  examination. Findings are compatible with an adynamic ileus.      Electronically Signed    By: Markus Daft M.D.    On: 09/11/2013 08:03        Verified By: Burman Riis, M.D.,   Assessment/Plan:  Assessment/Plan:  Assessment Ogilvie's syndrome/ileus:  Worsening.  Long discussion with patient that although rectal tube is bothersome it was effective & she does need it for decompression.  She agrees & will add proctofoam for discomfort.   Plan 1) q2hr position changes with rectal tube for 21mins with each change 2) continue supportive measures Please call if you have any questions or concerns   Electronic Signatures: Andria Meuse (NP)  (Signed 16-Jul-15 15:04)  Authored: Chief Complaint, VITAL SIGNS/ANCILLARY NOTES, Brief Assessment, Lab Results, Radiology Results, Assessment/Plan   Last Updated: 16-Jul-15 15:04 by Andria Meuse (NP)

## 2014-06-20 NOTE — Consult Note (Signed)
PATIENT NAME:  Molly [name], Molly [name] MR#:  [id number] DATE OF BIRTH:  [birth year]  DATE OF CONSULTATION:  08/30/2013  REFERRING PHYSICIAN:  Dr. Vianne Bulls. CONSULTING PHYSICIAN:  Corey Skains, MD  REASON FOR CONSULTATION: Anemia, coronary artery disease, atrial fibrillation with rapid ventricular rate, chronic kidney disease, and needing further treatment options.   CHIEF COMPLAINT: The patient is having some mild shortness of breath.   HISTORY OF PRESENT ILLNESS: This is an 81 year old female with known chronic kidney disease, acute on chronic renal failure, anemia status post previous coronary artery bypass graft, coronary artery disease with shortness of breath increasing in frequency and intensity over the last several days and needing admission, as well as having some bradycardia. The patient has had atrial fibrillation in the past and had an atrial fibrillation with rapid ventricular rate, and now spontaneously converted back to normal sinus rhythm. She is comfortable at this time with no apparent significant new heart failure-type symptoms. The patient has had some continued weakness and fatigue.  REVIEW OF SYSTEMS:  Remainder of review of systems is negative for vision change, ringing in the ears, hearing loss, cough, congestion, heartburn, nausea, vomiting, diarrhea, bloody stools, stomach pain, extremity pain, leg weakness, cramping of the buttocks, known blood clots, headaches, blackouts, dizzy spells, nosebleeds, congestion, trouble swallowing, frequent urination, urination at night, muscle weakness, numbness, anxiety, depression, skin lesions or skin rashes.   PAST MEDICAL HISTORY:  1.  Chronic kidney disease. 2.  Anemia. 3.  Coronary disease status post coronary artery bypass graft. 4.  Atrial fibrillation.   FAMILY HISTORY: No apparent family members with significant early onset of cardiovascular disease or hypertension.   SOCIAL HISTORY: The patient has no alcohol or tobacco use at  this time.   ALLERGIES:  AS LISTED.    MEDICATIONS: As listed.   PHYSICAL EXAMINATION:  VITAL SIGNS: Blood pressure is 110/68 bilaterally. Heart rate is 57 upright, reclining, and regular.  GENERAL: She is a well-appearing female in no acute distress.  HEENT: No icterus, thyromegaly, ulcers, hemorrhage, or xanthelasma.  CARDIOVASCULAR: Regular rate and rhythm. Normal S1 and S2. A 2/6 apical murmur consistent with mitral regurgitation. PMI is inferiorly displaced. Carotid upstroke normal without bruit. Jugular venous pressure is normal.  LUNGS: Have bibasilar crackles with some expiratory wheezes.  ABDOMEN: Soft, nontender, without hepatosplenomegaly or masses. Abdominal aorta is normal size without bruit.  EXTREMITIES: Two plus radial, femoral, dorsal pedal pulses, with no lower extremity edema  cyanosis, clubbing, or ulcers.  NEUROLOGIC: The patient is somewhat disoriented.   ASSESSMENT: An 80 year old female with severe chronic kidney disease with acute exacerbation, anemia, coronary artery disease with atrial fibrillation with rapid ventricular rate, now spontaneously converted to normal sinus rhythm, likely secondary to current illness, and acute renal failure.   RECOMMENDATIONS:  1.  No further intervention of atrial fibrillation at this time with conversion to normal sinus rhythm. 2.  Follow improvements and treatment of kidney disease, which is the main likely cause of her current atrial fibrillation.  3.  No use of anticoagulation due to significant anemia and concerns of bleeding and fall risks.  4.  No further intervention from a cardiovascular standpoint with no evidence of significant heart failure or anginal symptoms.     ____________________________ Corey Skains, MD bjk:ts D: 08/30/2013 07:55:22 ET T: 08/30/2013 12:35:09 ET JOB#: [id number]  cc: Corey Skains, MD, <Dictator> Corey Skains MD ELECTRONICALLY SIGNED 09/01/2013 13:22

## 2014-06-20 NOTE — Discharge Summary (Signed)
Dates of Admission and Diagnosis:  Date of Admission 27-Aug-2013   Date of Discharge 16-Sep-2013   Admitting Diagnosis Nausea,vomiting   Final Diagnosis 1. Ileus/Ogilvie syndrome 2. Dehydration 3. UTI 4. Sinus bradycardia 5. CAD s/p CABG 6. HTN 7. ARF 8. Left renal mass? Needs OP MRI abd with contrast 9. pAfib 10 DM 11. Hypokalemia 12. Hypomagnesemia 13. Chronic sytolic chf    Chief Complaint/History of Present Illness CHIEF COMPLAINT:  Sent in from [facility] for renal failure. With Nause/Vomiting  HISTORY OF PRESENT ILLNESS:  The patient is a pleasant 81 year old Caucasian female who was recently hospitalized here in late May for chest pain that turned out to be severe triple vessel disease and the patient was transferred to [facility]. It appears that she underwent a quadruple bypass surgery and was discharged to [facility] where she has been for the past 3 weeks or so.  Apparently, she was doing well at [facility] post surgery. The wound is healing. While at [facility], the patient has had poor p.o. intake, not taking much fluid or water, per daughter-in-law who was in the room. The patient feels fine. She is awake, alert. The patient has been having bouts of nausea and vomiting and has been having increased disorientation. Some labs were drawn today and it showed the patient to have severe renal failure with a GFR of about 5, BUN of 75 and creatinine of 7.55. There is no significant leukocytosis. A Foley was placed. Currently, there is about 100 mL of urine; this is probably within the last 8 hours or so, and hospitalist service was contacted for  further evaluation and management here. There were no labs drawn at the hospital, as the labs were drawn earlier in the morning. The patient denies having any shortness of breath or any pains in the chest.   Allergies:  No Known Allergies:   Hepatic:  19-Jul-15 03:26   Bilirubin, Total 0.3  Alkaline Phosphatase 71 (45-117 NOTE: New  Reference Range 01/17/13)  SGPT (ALT)  164  SGOT (AST)  141  Total Protein, Serum  4.9  Albumin, Serum  1.9  Routine Chem:  19-Jul-15 03:26   Magnesium, Serum  1.1 (1.8-2.4 THERAPEUTIC RANGE: 4-7 mg/dL TOXIC: > 10 mg/dL  -----------------------)  Glucose, Serum 98  BUN 10  Creatinine (comp) 1.11  Sodium, Serum 140  Potassium, Serum  3.3  Chloride, Serum 107  CO2, Serum 24  Calcium (Total), Serum  8.0  Anion Gap 9  Osmolality (calc) 278  eGFR (African American)  54  eGFR (Non-African American)  46 (eGFR values <31m/min/1.73 m2 may be an indication of chronic kidney disease (CKD). Calculated eGFR is useful in patients with stable renal function. The eGFR calculation will not be reliable in acutely ill patients when serum creatinine is changing rapidly. It is not useful in  patients on dialysis. The eGFR calculation may not be applicable to patients at the low and high extremes of body sizes, pregnant women, and vegetarians.)  21-Jul-15 04:51   Magnesium, Serum 1.8 (1.8-2.4 THERAPEUTIC RANGE: 4-7 mg/dL TOXIC: > 10 mg/dL  -----------------------)  Glucose, Serum 96  BUN 8  Creatinine (comp) 1.11  Sodium, Serum 141  Potassium, Serum 3.9  Chloride, Serum 106  CO2, Serum 27  Calcium (Total), Serum  8.3  Anion Gap 8  Osmolality (calc) 279  eGFR (African American)  54  eGFR (Non-African American)  46 (eGFR values <637mmin/1.73 m2 may be an indication of chronic kidney disease (CKD). Calculated eGFR is useful in  patients with stable renal function. The eGFR calculation will not be reliable in acutely ill patients when serum creatinine is changing rapidly. It is not useful in  patients on dialysis. The eGFR calculation may not be applicable to patients at the low and high extremes of body sizes, pregnant women, and vegetarians.)  Routine Coag:  19-Jul-15 03:26   Prothrombin  19.4  INR 1.7 (INR reference interval applies to patients on anticoagulant therapy. A single  INR therapeutic range for coumarins is not optimal for all indications; however, the suggested range for most indications is 2.0 - 3.0. Exceptions to the INR Reference Range may include: Prosthetic heart valves, acute myocardial infarction, prevention of myocardial infarction, and combinations of aspirin and anticoagulant. The need for a higher or lower target INR must be assessed individually. Reference: The Pharmacology and Management of the Vitamin K  antagonists: the seventh ACCP Conference on Antithrombotic and Thrombolytic Therapy. DJSHF.0263 Sept:126 (3suppl): [id number]. A HCT value >55% may artifactually increase the PT.  In one study,  the increase was an average of 25%. Reference:  "Effect on Routine and Special Coagulation Testing Values of Citrate Anticoagulant Adjustment in Patients with High HCT Values." American Journal of Clinical Pathology 2006;126:400-405.)  20-Jul-15 04:02   Prothrombin  17.3  INR 1.4 (INR reference interval applies to patients on anticoagulant therapy. A single INR therapeutic range for coumarins is not optimal for all indications; however, the suggested range for most indications is 2.0 - 3.0. Exceptions to the INR Reference Range may include: Prosthetic heart valves, acute myocardial infarction, prevention of myocardial infarction, and combinations of aspirin and anticoagulant. The need for a higher or lower target INR must be assessed individually. Reference: The Pharmacology and Management of the Vitamin K  antagonists: the seventh ACCP Conference on Antithrombotic and Thrombolytic Therapy. [id number].5027 Sept:126 (3suppl): [id number]. A HCT value >55% may artifactually increase the PT.  In one study,  the increase was an average of 25%. Reference:  "Effect on Routine and Special Coagulation Testing Values of Citrate Anticoagulant Adjustment in Patients with High HCT Values." American Journal of Clinical Pathology 2006;126:400-405.)  21-Jul-15  04:51   Prothrombin  16.0  INR 1.3 (INR reference interval applies to patients on anticoagulant therapy. A single INR therapeutic range for coumarins is not optimal for all indications; however, the suggested range for most indications is 2.0 - 3.0. Exceptions to the INR Reference Range may include: Prosthetic heart valves, acute myocardial infarction, prevention of myocardial infarction, and combinations of aspirin and anticoagulant. The need for a higher or lower target INR must be assessed individually. Reference: The Pharmacology and Management of the Vitamin K  antagonists: the seventh ACCP Conference on Antithrombotic and Thrombolytic Therapy. [id number].7867 Sept:126 (3suppl): [id number]. A HCT value >55% may artifactually increase the PT.  In one study,  the increase was an average of 25%. Reference:  "Effect on Routine and Special Coagulation Testing Values of Citrate Anticoagulant Adjustment in Patients with High HCT Values." American Journal of Clinical Pathology 2006;126:400-405.)  Routine Hem:  20-Jul-15 04:02   Hemoglobin (CBC)  8.2 (Result(s) reported on 15 Sep 2013 at 05:33AM.)   PERTINENT RADIOLOGY STUDIES: XRay:    11-Jul-15 10:18, KUB - Kidney Ureter Bladder  KUB - Kidney Ureter Bladder   REASON FOR EXAM:    ileus  COMMENTS:       PROCEDURE: DXR - DXR KIDNEY URETER BLADDER  - Sep 06 2013 10:18AM     CLINICAL DATA:  Ileus    EXAM:  ABDOMEN -  1 VIEW    COMPARISON:  Abdominal plain films 09/05/2013    FINDINGS:  NG tube extends to the esophageal junction. This side port is above  the GE junction. There are i gas distended loops of large and small  bowel which have not improved compared to prior. Loop in the right  lower quadrant measures 8 cm increased from 6 cm on prior. There is  no clear evidence of gas in the rectum.     IMPRESSION:  1. NG tube is at the GE junction. Recommend advancement by 8 to 10  cm such that the side port is below the GE  junction.  2. Gas-filled loops of large a small bowel have increased in  diameter compared to prior. Findings concerning for bowel  obstruction.  3. No evidence of intraperitoneal free air on supine exam.  These results will be called to the ordering clinician or  representative by the Radiologist Assistant, and communication  documented in the PACS or zVision Dashboard.  Electronically Signed    By: Suzy Bouchard M.D.    On: 09/06/2013 10:49         Verified By: Rennis Golden, M.D.,    12-Jul-15 08:57, Abdomen Flat and Erect  Abdomen Flat and Erect   REASON FOR EXAM:    f/u ileus vs sbo  COMMENTS:       PROCEDURE: DXR - DXR ABDOMEN 2 V FLAT AND ERECT  - Sep 07 2013  8:57AM     CLINICAL DATA:  Evaluate for ileus versus small-bowel obstruction    EXAM:  ABDOMEN - 2 VIEW    COMPARISON:  09/06/2013; 09/05/2013    FINDINGS:  Examination is degraded due to patient body habitus and portable  technique.  Grossly unchanged marked gas distention of multiple loops of large  and small bowel with index loop of descending colon measuring  approximately 7.5cm in diameter. No pneumoperitoneum, pneumatosis  or portal venous gas.    Enteric tube tip and side port again overlie the mid body of the  stomach.    No acute osseus abnormalities.    Limited visualization of the lower thorax demonstrates minimal left  basilar opacities.    No acute osseus abnormalities.   IMPRESSION:  1. Grossly unchanged findings most suggestive of ileus though note,  a distal coli obstruction could also have a similar appearance.  2. Unchanged left basilar/retrocardiac opacities, atelectasis versus  infiltrate. Further evaluation with PA and lateral chest radiograph  could be performed as clinically indicated.      Electronically Signed    By: Sandi Mariscal M.D.    On: 09/07/2013 14:12         Verified By: Aileen Fass, M.D.,    14-Jul-15 07:22, KUB - Kidney Ureter Bladder  KUB - Kidney  Ureter Bladder   REASON FOR EXAM:    ileus follow up  COMMENTS:       PROCEDURE: DXR - DXR KIDNEY URETER BLADDER  - Sep 09 2013  7:22AM     CLINICAL DATA:  Ileus.    EXAM:  ABDOMEN - 1 VIEW    COMPARISON:  09/07/2013    FINDINGS:  Nasogastric tube remains extending into the distal stomach. Gaseous  distention of the colon and small bowel shows interval improvement  consistent with improving ileus. No gross free air or pneumatosis  identified. The cecal diameter is approximately 6.5 cm.     IMPRESSION:  Improving colonic ileus.      Electronically  Signed    By: Aletta Edouard M.D.    On: 09/09/2013 08:20         Verified By: Azzie Roup, M.D.,    16-Jul-15 07:15, KUB - Kidney Ureter Bladder  KUB - Kidney Ureter Bladder   REASON FOR EXAM:    ileus follow up  COMMENTS:       PROCEDURE: DXR - DXR KIDNEY URETER BLADDER  - Sep 11 2013  7:15AM     CLINICAL DATA:  Abdominal distention.    EXAM:  ABDOMEN - 1 VIEW    COMPARISON:  09/09/2013 and 09/07/2013    FINDINGS:  There continues to be gas-filled loops of small and large bowel.  Nasogastric tube appears to be removed. Distended colon in the left  abdomen measuring up to 7.6 cm. Previously, the largest colonic loop  measured roughly 6.4 cm. Cannot evaluate for free air on these  supine images.     IMPRESSION:  Persistent dilated loops of small and large bowel. Slightly  increased colonic distention compared to the most recent comparison  examination. Findings are compatible with an adynamic ileus.      Electronically Signed    By: Markus Daft M.D.    On: 09/11/2013 08:03       Verified By: Burman Riis, M.D.,    17-Jul-15 13:06, KUB - Kidney Ureter Bladder  KUB - Kidney Ureter Bladder   REASON FOR EXAM:    abd pain, ileus  COMMENTS:       PROCEDURE: DXR - DXR KIDNEY URETER BLADDER  - Sep 12 2013  1:06PM     CLINICAL DATA:  Abdominal pain an ileus    EXAM:  ABDOMEN - 1 VIEW    COMPARISON:   09/11/2013 and 09/09/2013 abdominal radiographs. CT  abdomen pelvis 09/07/2013    FINDINGS:  There are persistently dilated loops of small bowel and colon. Small  bowel distension measures up to approximately 3.8 cm and colonic  distention measures up to 6.7 cm. Bowel dilatation may be slightly  decreased compared to yesterday's radiographs. At portion of the far  lateral left abdomen is excluded from these images. Evaluation for  free intraperitoneal air is very limited without the decubitus or  upright view. Facet joint degenerative changes of the lumbar spine.     IMPRESSION:  Persistent ileus. Gaseous distention of bowel loops may be slightly  decreased compared to 09/11/2013.      Electronically Signed    By: Curlene Dolphin M.D.    On: 09/12/2013 13:22     Verified By: Sheppard Evens, M.D.,    19-Jul-15 08:26, KUB - Kidney Ureter Bladder  KUB - Kidney Ureter Bladder   REASON FOR EXAM:    ileus f/u  COMMENTS:       PROCEDURE: DXR - DXR KIDNEY URETER BLADDER  - Sep 14 2013  8:26AM     CLINICAL DATA:  Followup ileus.    EXAM:  ABDOMEN - 1 VIEW    COMPARISON:  09/07/2013    FINDINGS:  Interval decrease in degree of small bowel and colonic dilatation  seen since prior study. Foley catheter is seen within the bladder.   IMPRESSION:  Decreased small bowel and colonic dilatation since prior study.      Electronically Signed    By: Earle Gell M.D.    On: 09/14/2013 08:52         Verified By: Marlaine Hind, M.D.,  [place]:    02-Jul-15 09:22,  US Kidney Bilateral  US Kidney Bilateral   REASON FOR EXAM:    renal failure, eval for hydro  COMMENTS:       PROCEDURE: [place]  - US KIDNEY  - Aug 28 2013  9:22AM     CLINICAL DATA:  Renal failure question hydronephrosis    EXAM:  RENAL/URINARY TRACT ULTRASOUND COMPLETE    COMPARISON:  None    FINDINGS:  Right Kidney:  Length: 10.0 cm. Mild cortical thinning. Borderline increased  cortical echogenicity. No mass,  hydronephrosis or shadowing  calcification.    Left Kidney:    Length: 12.0 cm. Normal cortical thickness. Normal cortical  echogenicity. Mildly hyperechoic mass at mid LEFT kidney 1.8 x 1.8 x  1.5 cm. No hydronephrosis or shadowing calcification.    Bladder:    Decompressed by Foley catheter, inadequately evaluated.     IMPRESSION:  No evidence of hydronephrosis.    Mildly hyperechoic nonspecific LEFT renal mass 1.8 x 1.8 x 1.5 cm ;  further characterization of this lesion by MR imaging with and  without contrast recommended.      Electronically Signed    By: Lavonia Dana M.D.    On: 08/28/2013 09:23         Verified By: Lorenza Burton. Thornton Papas, M.D.,  [place]:  PACS Image     11-Jul-15 10:18, KUB - Kidney Ureter Bladder  PACS Image     12-Jul-15 08:57, Abdomen Flat and Erect  PACS Image     12-Jul-15 12:09, CT Abdomen and Pelvis Without Contrast  PACS Image     14-Jul-15 07:22, KUB - Kidney Ureter Bladder  PACS Image     16-Jul-15 07:15, KUB - Kidney Ureter Bladder  PACS Image     17-Jul-15 13:06, KUB - Kidney Ureter Bladder  PACS Image     19-Jul-15 08:26, KUB - Kidney Ureter Bladder  PACS Image   CT:    12-Jul-15 12:09, CT Abdomen and Pelvis Without Contrast  CT Abdomen and Pelvis Without Contrast   REASON FOR EXAM:    (1) Abdominal distention, intestinal distention; (2)   Abdominal distention, intes  COMMENTS:       PROCEDURE: CT  - CT ABDOMEN AND PELVIS W0  - Sep 07 2013 12:09PM     CLINICAL DATA:  Ileus.    EXAM:  CT ABDOMEN AND PELVIS WITHOUT CONTRAST    TECHNIQUE:  Multidetector CT imaging of the abdomen and pelvis was performed  following the standard protocol without IV contrast.  COMPARISON:  Abdominal series 712 2015.    FINDINGS:  Liver normal. Spleen normal. Pancreas normal. No biliary distention.    Adrenals normal. Kidneys are normal. Nonobstructing ureteral stone.  The bladder is nondistended. Calcifications within the pelvis  consistent  with phleboliths. Hysterectomy.    No significant adenopathy. Abdominal aorta normal caliber. No  aneurysm. Prominent aortoiliac atherosclerotic vascular  calcification. Prominent visceral atherosclerotic vascular  calcification.    The appendix is not visualized. The colon is diffusely dilated to  the level of the rectum with a large amount of fluid in the colon.  Cecal diameter up to approximately 9 cm noted. Associated small  bowel distention present. Degree of small-bowel distention is less  than that of colon. These findings are most consistent with adynamic  ileus primarily involving the colon. No pneumatosis. No portal  venous air. NG tube noted in the stomach. Stomach is decompressed.  Small sliding hiatal hernia is present.    Coronary artery disease. Prior CABG. Left  lower lobe infiltrate  and/or atelectasis with left-sided pleural effusion. Degenerative  changes lumbar spine. Mild T12 compression. Diffuse mild anasarca.     IMPRESSION:  1. Findings consistent with prominent colonic ileus. These results  were called by telephone at the time of interpretation on 09/07/2013  at 3:28 PM to Dr. Marlyce Huge , who verbally acknowledged  these results.    2. Left lower lobe atelectasis and/or infiltrate with small left  pleural effusion.      Electronically Signed    By: Marcello Moores  Register    On: 09/07/2013 15:30         Verified By: Osa Craver, M.D., MD   Pertinent Past History:  Pertinent Past History PAST MEDICAL HISTORY:  1.  History of hypertension.  2.  Diabetes.  3.  Hyperlipidemia.  4.  Ischemic cardiomyopathy with severe congestive heart failure, EF of about 25% to 30%.  5.  Recent non-ST elevation MI. Cardiac cath showed severe triple vessel disease and the patient underwent successful CABG.  6.  History of Afib.  7.  Chronic systolic/diastolic CHF.   Hospital Course:  Hospital Course 46 f with CAD, HTN with recent CABG admitted for ileus  with Nausea, vomiting, abd pain  * Ileus/Ogilvie syndrome Ct abd showed no obstruction. very slow improvement. But has no abd pain, nausea, vomiting now. Xray looks better. On regular diet. Resolved. Needs a good bowel regimen  * ARF due to dehydration Resolved. Poor PO intake and high risk for deterioration. IVF stopped   * Left renal mass? Found on [place] but nothing on CT abd. Will need OP MRI with gad  * UTI Finished abx  * Sinus bradycardia- Mild- resolved BB dose changed  * CAD s/p CABG Nothing acute ASA,Statin  * Chronic systolic chf Stable. No signs of fluid overload. On ACE/BB  * pAfib. Now in NSR On rate control meds. Dose resumed at home dose. needs repeat INR in 3-4 days  * DM Poor appetite On SSI  * HTN - Fair control  * Hypokalemia/Hypomagnesemia Replace PRN On daily doses  Foley and rectal tube discontinued. Working with PT and needs further rehab.  Time spent on d/c 40 minutes   Condition on Discharge Fair   Code Status:  Code Status No Code/Do Not Resuscitate   PHYSICAL EXAM ON DISCHARGE:  Physical Exam:  GEN no acute distress, thin   HEENT pale conjunctivae, hearing intact to voice, moist oral mucosa   NECK supple  No masses   RESP normal resp effort  clear BS   CARD regular rate   ABD denies tenderness  soft  normal BS   EXTR negative cyanosis/clubbing   PSYCH alert, A+O to time, place, person, good insight   VITAL SIGNS:  Vital Signs: **Vital Signs.:   21-Jul-15 06:03  Vital Signs Type Routine  Temperature Temperature (F) 98.3  Celsius 36.8  Temperature Source oral  Pulse Pulse 60  Respirations Respirations 18  Systolic BP Systolic BP 191  Diastolic BP (mmHg) Diastolic BP (mmHg) 70  Mean BP 90  Pulse Ox % Pulse Ox % 96  Pulse Ox Activity Level  At rest  Oxygen Delivery Room Air/ 21 %   DISCHARGE INSTRUCTIONS HOME MEDS:  Medication Reconciliation: Patient's Home Medications at Discharge:     Medication  Instructions  aspirin 81 mg oral tablet, chewable  1 tab(s) orally once a day   crestor 40 mg oral tablet  1 tab(s) orally once a day (at bedtime)  metformin 500 mg oral tablet  2 tab(s) orally 2 times a day   tramadol 50 mg oral tablet  1 tab(s) orally every 6 hours, As Needed   amiodarone 200 mg oral tablet  1 tab(s) orally once a day   topamax 25 mg oral capsule  1 cap(s) orally 2 times a day   lisinopril 2.5 mg oral tablet  1 tab(s) orally once a day   warfarin 1 mg oral tablet  1.5 tab(s) orally once a day   carvedilol 3.125 mg oral tablet  1 tab(s) orally 2 times a day   docusate-senna 50 mg-8.6 mg oral tablet  1 tab(s) orally 2 times a day   polyethylene glycol 3350 oral powder for reconstitution  17 gram(s) orally once a day   potassium chloride 20 meq oral tablet, extended release  1 tab(s) orally once a day   magnesium oxide 400 mg (241.3 mg elemental magnesium) oral tablet  1 tab(s) orally 2 times a day   glucerna shake *  237 milliliter(s) orally 3 times a day (with meals)   metoclopramide 5 mg oral tablet, disintegrating  1 tab(s) orally 3 times a day, As Needed - for Nausea, Vomiting    STOP TAKING THE FOLLOWING MEDICATION(S):    metoprolol 25 mg oral tablet: 1 tab(s) orally 2 times a day hydrochlorothiazide-triamterene 25 mg-37.5 mg oral tablet: 1 tab(s) orally once a day propranolol 20 mg oral tablet: 1 tab(s) orally 2 times a day bisacodyl 10 mg rectal suppository: 1 suppository(ies) rectal once a day  Physician's Instructions:  Diet Low Sodium   Activity Limitations As tolerated  with assistance   Return to Work Not Applicable   Time frame for Follow Up Appointment 1-2 days  Physician at rehab   Electronic Signatures: Alba Destine (MD)  (Signed 21-Jul-15 12:07)  Authored: ADMISSION DATE AND DIAGNOSIS, CHIEF COMPLAINT/HPI, Allergies, PERTINENT LABS, PERTINENT RADIOLOGY STUDIES, PERTINENT PAST HISTORY, HOSPITAL COURSE, PHYSICAL EXAM ON DISCHARGE, VITAL  SIGNS, [place], PATIENT INSTRUCTIONS   Last Updated: 21-Jul-15 12:07 by Alba Destine (MD)

## 2014-06-20 NOTE — Consult Note (Signed)
PATIENT NAME:  Molly [name], Molly [name] MR#:  [id number] DATE OF BIRTH:  [birth year]  DATE OF CONSULTATION:  09/06/2013  REFERRING PHYSICIAN:   CONSULTING PHYSICIAN:  Harrell Gave A. Latesia Norrington, MD REASON FOR CONSULTATION: Abdominal distention, poor appetite, distended loops of bowel on plain film abdominal x-ray.   HISTORY OF PRESENT ILLNESS: [name] is a pleasant 81 year old female with a history of recent hospitalization in May, which was found to have severe triple-vessel disease and went to [facility] for quadruple bypass surgery, and has been in [place] for the last 3 weeks, according to notes in the patient who is a poor historian. She has had poor p.o. intake and is in renal failure as a result.  She has been admitted since the first of the month. Her creatinine has slowly improved.  According to her, her appetite has not been well, and over the last "few days or so," she has felt that her belly has gotten more distended, does not know when her last bowel movement was, but was not today or yesterday and is not passing flatus. She has had no nausea or vomiting. Had a KUB yesterday, which showed distended loops of bowel, which is diffuse today, similar but worse picture.  Has an NG tube currently.  Has a history of previous surgeries including appendectomy and hysterectomy. No fevers, chills, night sweats, shortness of breath, cough, chest pain currently. No current abdominal pain, nausea, vomiting, dysuria or hematuria.   PAST MEDICAL HISTORY:  1.  Acute renal failure secondary to poor p.o. intake and medications.  2.  History of non-ST MI and 4-vessel CABG at [facility] in June.  3.  History of ischemic cardiomyopathy with severe congestive heart failure, EF approximately 25%-30%.  4.  History of hypertension.  5.  History of diabetes.  6.  History of hyperlipidemia.  7.  History of atrial fibrillation.   HOME MEDICATIONS:  1.  Amiodarone.  2.  Aspirin.  3.  Dulcolax.  4.  Crestor.  5.   Hydrochlorothiazide/triamterene.  6.  Lisinopril.  7.  Metformin.  8.  Metoprolol.  9.  Propranolol. 10.  Topamax.  11.  Tramadol.   ALLERGIES: NO KNOWN DRUG ALLERGIES.   FAMILY HISTORY: Positive for diabetes.   SOCIAL HISTORY: Denies tobacco, alcohol, or drug use.   REVIEW OF SYSTEMS: As above.   PHYSICAL EXAMINATION:  VITAL SIGNS: Temperature 98.5, pulse 57, blood pressure 146/62, respirations 18, 97% on room air.  GENERAL: No acute distress, alert and oriented x 3.  HEAD: Normocephalic, atraumatic.  EYES: No scleral icterus. No conjunctivitis.  FACE: No obvious facial trauma. Normal external nose. Normal external ears.  CHEST: Lungs clear to auscultation.  Moving air well.  HEART: Regular rate and rhythm. No murmurs, rubs, or gallops.  ABDOMEN: Soft, moderately distended, and nontender.  EXTREMITIES: Moves all extremities well. Strength 5/5.  NEUROLOGIC:  Cranial nerves II-XII grossly intact. Sensation intact in all 4 extremities.   LABORATORY AND RADIOLOGICAL DATA: Currently with a creatinine of 1.63, which is down from much higher at admission, 6.09.  White cell count 8.8, hemoglobin 8.3, platelets 128.   IMAGING: KUB from yesterday and today which shows generalized bowel dilatation, more significant on today's KUB. Does have NG tube concern for possible bowel obstruction.   ASSESSMENT AND PLAN: Molly [name] is a pleasant 81 year old female with a poor appetite, who over the last few days has had worsening abdominal distention, although it is possible that she has a bowel obstruction, and if she does  not resolve with nasogastric decompression, this is concerning most likely with other events going on and this is suggestive of ileus, would recommend nasogastric tube at this time.  May consider a CT scan if it does not improve.  The patient is a poor operative candidate because of recent myocardial infarction, and would attempt to manage bowel obstruction non-operatively at this  time.    ____________________________ Glena Norfolk Shanicqua Coldren, MD cal:ts D: 09/06/2013 12:04:32 ET T: 09/06/2013 13:36:31 ET JOB#: [id number]  cc: Harrell Gave A. Pete Merten, MD, <Dictator> Floyde Parkins MD ELECTRONICALLY SIGNED 09/07/2013 18:16

## 2014-06-20 NOTE — Discharge Summary (Signed)
PATIENT NAME:  Molly [name], Molly [name] MR#:  [id number] DATE OF BIRTH:  1928/07/26  DATE OF ADMISSION:  07/23/2013 DATE OF DISCHARGE:  07/26/2013  ADDENDUM  This is an addendum to earlier dictated discharge summary on the 29th of May 2015 by Dr. Bridgette Habermann. The patient was supposed to be transferred to [facility] for further management, but on the 29th of May could not be transferred because bed was not available and finally on 05/30 the patient was transferred. For all other details please see discharge summary dictated on the 07/25/2013.  ____________________________ Ceasar Lund. Anselm Jungling, MD vgv:sg D: 07/31/2013 13:43:00 ET T: 07/31/2013 14:03:32 ET JOB#: [id number]  cc: Ceasar Lund. Anselm Jungling, MD, <Dictator> Vaughan Basta MD ELECTRONICALLY SIGNED 08/13/2013 9:01

## 2014-06-20 NOTE — Consult Note (Signed)
Brief Consult Note: Diagnosis: ileus.   Patient was seen by consultant.   Consult note dictated.   Comments: The patient has high pitched bowel sounds. No passing of gas. Has electrolyte abnormalities at present. Will give enemas due to no bowel movements and place an NG to LIS.  Electronic Signatures: Lucilla Lame (MD)  (Signed 11-Jul-15 08:44)  Authored: Brief Consult Note   Last Updated: 11-Jul-15 08:44 by Lucilla Lame (MD)

## 2014-06-20 NOTE — Consult Note (Signed)
PATIENT NAME:  Molly [name], Molly [name] MR#:  [id number] DATE OF BIRTH:  03-05-1928  DATE OF CONSULTATION:  09/06/2013  CONSULTING SERVICE:  Gastroenterology.  CONSULTING PHYSICIAN:  Lucilla Lame, MD  REASON FOR CONSULTATION: Ileus.   HISTORY OF PRESENT ILLNESS: This patient is an 81 year old woman who recently had a quadruple bypass and was in rehabilitation. The patient started to have abdominal distention and poor appetite and renal failure. The patient was admitted the hospital and was noted on a KUB to have extended loops of bowel and the hospitalist called a consult for GI. The patient denies ever having episodes of this in the past. She also denies any nausea and vomiting prior to this. She states that her abdomen started to get enlarged and hard in  last couple of days. She has not had any had any bowel movements and did not pass any gas today or yesterday. She had a repeat KUB today that showed even further distention of her small bowel.   PAST MEDICAL HISTORY: Acute renal failure secondary to poor p.o. intake and medications,  history of a non-ST MI, 4-vessel CABG at [facility] June, history of ischemic cardiomyopathy and CHF, hypertension, diabetes, hyperlipidemia, atrial fibrillation.   HOME MEDICATIONS: Amiodarone, aspirin, Dulcolax, Crestor, hydrochlorothiazide/triamterene, lisinopril, metformin, metoprolol, propranolol, Topamax, tramadol.   ALLERGIES: No known drug allergies.   FAMILY HISTORY: Noncontributory.   SOCIAL HISTORY: No tobacco, alcohol or drugs.  REVIEW OF SYSTEMS: A 10-point  review of systems negative except for what is stated above.   PHYSICAL EXAMINATION: VITAL SIGNS: Temperature 98.6, pulse 54, respirations 15, blood pressure 135/63, pulse oximetry 96% on room air.   HEENT: Normocephalic, atraumatic. Extraocular motor intact. Pupils equally round and reactive to light and accommodation without JVD, without lymphadenopathy.  LUNGS: Clear to auscultation  bilaterally.  HEART: Regular rate and rhythm without murmurs, rubs, or gallops.  ABDOMEN: Soft, moderately distended with tympany on percussion. The area has also high pitched bowel sounds.  EXTREMITIES: Without cyanosis, clubbing, or edema.  NEUROLOGICAL: Grossly intact.  PSYCHIATRIC: Alert and orientated x3.   ANCILLARY SERVICES: Laboratories showed patient to have creatinine of 1.63, hemoglobin of 8.1, hematocrit 25.4, WBCs of 8.8, INR of 1.4, potassium low at 2.8, chloride high at 111.   ASSESSMENT AND PLAN: This patient is an 81 year old woman who I was consulted for, possible ileus. I ordered an NG tube and some enemas because the patient has not been having any bowel movements recently.  The patient's abdominal x-rays show worsening of her abdominal distention and distended small and large loops of bowel, consistent with possible worsening ileus versus obstruction. A surgical consult has been called for Dr. Rexene Edison who will see the patient for possible obstruction; otherwise, treat the ileus with NG decompression and correction of her abnormal electrolytes should be instituted.   Thank you very much for involving me in the care of this patient. If you have any questions, please do not hesitate to call.   ____________________________ Lucilla Lame, MD dw:ds D: 09/06/2013 16:46:12 ET T: 09/06/2013 17:24:13 ET JOB#: [id number]  cc: Lucilla Lame, MD, <Dictator> Lucilla Lame MD ELECTRONICALLY SIGNED 09/07/2013 8:06

## 2014-06-20 NOTE — Consult Note (Signed)
Chief Complaint:  Subjective/Chief Complaint Pt feels distention is somewhat better but "I just hurt all over"   VITAL SIGNS/ANCILLARY NOTES: **Vital Signs.:   14-Jul-15 07:09  Vital Signs Type Q 4hr  Temperature Temperature (F) 97.4  Celsius 36.3  Temperature Source oral  Pulse Pulse 63  Respirations Respirations 18  Systolic BP Systolic BP 094  Diastolic BP (mmHg) Diastolic BP (mmHg) 70  Mean BP 95  Pulse Ox % Pulse Ox % 98  Pulse Ox Activity Level  At rest  Oxygen Delivery Room Air/ 21 %   Brief Assessment:  GEN well developed, no acute distress, A/Ox3.  Son at bedside.   Cardiac Regular   Respiratory normal resp effort   Gastrointestinal details normal No rebound tenderness  No gaurding  +Moderate distention is improved but persistent.  +high pitched BS, +typany, +mild tenderness entire abdomen   EXTR negative cyanosis/clubbing, negative edema   Additional Physical Exam Skin: pale, warm, dry   Lab Results:  Routine Chem:  14-Jul-15 04:32   Magnesium, Serum 2.3 (1.8-2.4 THERAPEUTIC RANGE: 4-7 mg/dL TOXIC: > 10 mg/dL  -----------------------)  Creatinine (comp)  1.43  eGFR (African American)  39  eGFR (Non-African American)  34 (eGFR values <73m/min/1.73 m2 may be an indication of chronic kidney disease (CKD). Calculated eGFR is useful in patients with stable renal function. The eGFR calculation will not be reliable in acutely ill patients when serum creatinine is changing rapidly. It is not useful in  patients on dialysis. The eGFR calculation may not be applicable to patients at the low and high extremes of body sizes, pregnant women, and vegetarians.)  Potassium, Serum  3.2 (Result(s) reported on 09 Sep 2013 at 05:39AM.)  Routine Coag:  14-Jul-15 04:32   Prothrombin  27.7  INR 2.7 (INR reference interval applies to patients on anticoagulant therapy. A single INR therapeutic range for coumarins is not optimal for all indications; however, the suggested  range for most indications is 2.0 - 3.0. Exceptions to the INR Reference Range may include: Prosthetic heart valves, acute myocardial infarction, prevention of myocardial infarction, and combinations of aspirin and anticoagulant. The need for a higher or lower target INR must be assessed individually. Reference: The Pharmacology and Management of the Vitamin K  antagonists: the seventh ACCP Conference on Antithrombotic and Thrombolytic Therapy. [id number].8811Sept:126 (3suppl): [id number] A HCT value >55% may artifactually increase the PT.  In one study,  the increase was an average of 25%. Reference:  "Effect on Routine and Special Coagulation Testing Values of Citrate Anticoagulant Adjustment in Patients with High HCT Values." American Journal of Clinical Pathology 2006;126:400-405.)  Routine Hem:  14-Jul-15 04:32   Hemoglobin (CBC)  7.8 (Result(s) reported on 09 Sep 2013 at 05:47AM.)   Radiology Results: XRay:    14-Jul-15 07:22, KUB - Kidney Ureter Bladder  KUB - Kidney Ureter Bladder   REASON FOR EXAM:    ileus follow up  COMMENTS:       PROCEDURE: DXR - DXR KIDNEY URETER BLADDER  - Sep 09 2013  7:22AM     CLINICAL DATA:  Ileus.    EXAM:  ABDOMEN - 1 VIEW    COMPARISON:  09/07/2013    FINDINGS:  Nasogastric tube remains extending into the distal stomach. Gaseous  distention of the colon and small bowel shows interval improvement  consistent with improving ileus. No gross free air or pneumatosis  identified. The cecal diameter is approximately 6.5 cm.     IMPRESSION:  Improving colonic ileus.  Electronically Signed    By: Aletta Edouard M.D.    On: 09/09/2013 08:20         Verified By: Azzie Roup, M.D.,   Assessment/Plan:  Assessment/Plan:  Assessment Ogilvie's syndrome/ileus:  KUB/exam show interval improvement with colonic decompression.  Likely secondary to post-op, electrolyte imbalance (K/Mag) & immobility.  Hypokalemia/Hypomagnesemia: K  improved but not yet normal.  Repletion per attending's orders.  Mag repleted.   Plan 1) Pt intolerant of intermittent rectal tube.  Will change to continuous with continued position change every 2 hrs from left/back/right/repeat.   2) Electrolyte repletion per attending 3) Avoid narcotics 4) follow up stool studies Please call if you have any questions or concerns   Electronic Signatures: Andria Meuse (NP)  (Signed 14-Jul-15 09:28)  Authored: Chief Complaint, VITAL SIGNS/ANCILLARY NOTES, Brief Assessment, Lab Results, Radiology Results, Assessment/Plan   Last Updated: 14-Jul-15 09:28 by Andria Meuse (NP)

## 2014-06-20 NOTE — Consult Note (Signed)
Chief Complaint:  Subjective/Chief Complaint Distention much improved with decompression over weekend.  Proctalgia improved.  Denies nausea or vomiting.  Appetite improving.   VITAL SIGNS/ANCILLARY NOTES: **Vital Signs.:   20-Jul-15 09:34  Vital Signs Type Routine  Celsius 36.6  Pulse Pulse 68  Respirations Respirations 18  Systolic BP Systolic BP 917  Diastolic BP (mmHg) Diastolic BP (mmHg) 75  Mean BP 98  Pulse Ox % Pulse Ox % 98  Pulse Ox Activity Level  At rest  Oxygen Delivery Room Air/ 21 %   Brief Assessment:  GEN well developed, no acute distress, A/Ox3. +smiles   Cardiac Regular   Respiratory normal resp effort   Gastrointestinal details normal Soft  Nontender  Nondistended  Bowel sounds normal  No rebound tenderness  No gaurding  +flexiseal with scant light brown stool   EXTR negative cyanosis/clubbing, negative edema   Additional Physical Exam Skin: pale, warm, dry   Lab Results: Routine Coag:  20-Jul-15 04:02   Prothrombin  17.3  INR 1.4 (INR reference interval applies to patients on anticoagulant therapy. A single INR therapeutic range for coumarins is not optimal for all indications; however, the suggested range for most indications is 2.0 - 3.0. Exceptions to the INR Reference Range may include: Prosthetic heart valves, acute myocardial infarction, prevention of myocardial infarction, and combinations of aspirin and anticoagulant. The need for a higher or lower target INR must be assessed individually. Reference: The Pharmacology and Management of the Vitamin K  antagonists: the seventh ACCP Conference on Antithrombotic and Thrombolytic Therapy. HXTAV.6979 Sept:126 (3suppl): [id number]. A HCT value >55% may artifactually increase the PT.  In one study,  the increase was an average of 25%. Reference:  "Effect on Routine and Special Coagulation Testing Values of Citrate Anticoagulant Adjustment in Patients with High HCT Values." American Journal of  Clinical Pathology 4801;655:374-827.)  Routine Hem:  20-Jul-15 04:02   Hemoglobin (CBC)  8.2 (Result(s) reported on 15 Sep 2013 at 05:33AM.)   Assessment/Plan:  Assessment/Plan:  Assessment Ogilvie's syndrome/ileus:  Much improved.  Trial of removal of rectal tube today.   Plan Will sign off, Please call if you have any questions or concerns   Electronic Signatures: Andria Meuse (NP)  (Signed 20-Jul-15 11:23)  Authored: Chief Complaint, VITAL SIGNS/ANCILLARY NOTES, Brief Assessment, Lab Results, Assessment/Plan   Last Updated: 20-Jul-15 11:23 by Andria Meuse (NP)

## 2014-06-20 NOTE — Consult Note (Signed)
Chief Complaint:  Subjective/Chief Complaint Pt c/o 10/10 abdominal distention.  "I just feel bad"  1 loose BM in 24 hrs.   VITAL SIGNS/ANCILLARY NOTES: **Vital Signs.:   13-Jul-15 01:25  Respirations Respirations 18    05:20  Vital Signs Type Q 4hr  Temperature Temperature (F) 98.9  Celsius 37.1  Temperature Source oral  Pulse Pulse 63  Respirations Respirations 18  Systolic BP Systolic BP 580  Diastolic BP (mmHg) Diastolic BP (mmHg) 66  Mean BP 84  Pulse Ox % Pulse Ox % 95  Pulse Ox Activity Level  At rest  Oxygen Delivery Room Air/ 21 %   Brief Assessment:  GEN well developed, no acute distress, A/Ox3.  Appears uncomfotable.  Son & neighbor at bedside.   Cardiac Regular   Respiratory normal resp effort   Gastrointestinal details normal No rebound tenderness  No gaurding  +Diffuse distention, +high pitched BS, +typany, +mild tenderness   EXTR negative cyanosis/clubbing, negative edema   Additional Physical Exam Skin: pale, warm, dry   Lab Results: Thyroid:  02-Jul-15 04:50   Thyroid Stimulating Hormone 2.25 (0.45-4.50 (International Unit)  ----------------------- Pregnant patients have  different reference  ranges for TSH:  - - - - - - - - - -  Pregnant, first trimetser:  0.36 - 2.50 uIU/mL)  Routine Chem:  13-Jul-15 03:21   Magnesium, Serum  3.4 (1.8-2.4 THERAPEUTIC RANGE: 4-7 mg/dL TOXIC: > 10 mg/dL  -----------------------)  Potassium, Serum  2.8 (Result(s) reported on 08 Sep 2013 at 04:04AM.)  Creatinine (comp)  1.50  eGFR (African American)  37  eGFR (Non-African American)  32 (eGFR values <20m/min/1.73 m2 may be an indication of chronic kidney disease (CKD). Calculated eGFR is useful in patients with stable renal function. The eGFR calculation will not be reliable in acutely ill patients when serum creatinine is changing rapidly. It is not useful in  patients on dialysis. The eGFR calculation may not be applicable to patients at the low and high  extremes of body sizes, pregnant women, and vegetarians.)  Routine Coag:  13-Jul-15 03:21   Prothrombin  27.9  INR 2.7 (INR reference interval applies to patients on anticoagulant therapy. A single INR therapeutic range for coumarins is not optimal for all indications; however, the suggested range for most indications is 2.0 - 3.0. Exceptions to the INR Reference Range may include: Prosthetic heart valves, acute myocardial infarction, prevention of myocardial infarction, and combinations of aspirin and anticoagulant. The need for a higher or lower target INR must be assessed individually. Reference: The Pharmacology and Management of the Vitamin K  antagonists: the seventh ACCP Conference on Antithrombotic and Thrombolytic Therapy. [id number].8250Sept:126 (3suppl): [id number] A HCT value >55% may artifactually increase the PT.  In one study,  the increase was an average of 25%. Reference:  "Effect on Routine and Special Coagulation Testing Values of Citrate Anticoagulant Adjustment in Patients with High HCT Values." American Journal of Clinical Pathology 2006;126:400-405.)  Routine Hem:  13-Jul-15 03:21   Hemoglobin (CBC)  8.6 (Result(s) reported on 08 Sep 2013 at 04:17AM.)  Platelet Count (CBC) 172 (Result(s) reported on 08 Sep 2013 at 04:17AM.)   Radiology Results: XRay:    12-Jul-15 08:57, Abdomen Flat and Erect  Abdomen Flat and Erect   REASON FOR EXAM:    f/u ileus vs sbo  COMMENTS:       PROCEDURE: DXR - DXR ABDOMEN 2 V FLAT AND ERECT  - Sep 07 2013  8:57AM     CLINICAL DATA:  Evaluate for ileus versus small-bowel obstruction    EXAM:  ABDOMEN - 2 VIEW    COMPARISON:  09/06/2013; 09/05/2013    FINDINGS:  Examination is degraded due to patient body habitus and portable  technique.  Grossly unchanged marked gas distention of multiple loops of large  and small bowel with index loop of descending colon measuring  approximately 7.5cm in diameter. No pneumoperitoneum,  pneumatosis  or portal venous gas.    Enteric tube tip and side port again overlie the mid body of the  stomach.    No acute osseus abnormalities.    Limited visualization of the lower thorax demonstrates minimal left  basilar opacities.    No acute osseus abnormalities.   IMPRESSION:  1. Grossly unchanged findings most suggestive of ileus though note,  a distal coli obstruction could also have a similar appearance.  2. Unchanged left basilar/retrocardiac opacities, atelectasis versus  infiltrate. Further evaluation with PA and lateral chest radiograph  could be performed as clinically indicated.      Electronically Signed    By: Sandi Mariscal M.D.    On: 09/07/2013 14:12         Verified By: Aileen Fass, M.D.,  CT:    12-Jul-15 12:09, CT Abdomen and Pelvis Without Contrast  CT Abdomen and Pelvis Without Contrast   REASON FOR EXAM:    (1) Abdominal distention, intestinal distention; (2)   Abdominal distention, intes  COMMENTS:       PROCEDURE: CT  - CT ABDOMEN AND PELVIS W0  - Sep 07 2013 12:09PM     CLINICAL DATA:  Ileus.    EXAM:  CT ABDOMEN AND PELVIS WITHOUT CONTRAST    TECHNIQUE:  Multidetector CT imaging of the abdomen and pelvis was performed  following the standard protocol without IV contrast.  COMPARISON:  Abdominal series 712 2015.    FINDINGS:  Liver normal. Spleen normal. Pancreas normal. No biliary distention.    Adrenals normal. Kidneys are normal. Nonobstructing ureteral stone.  The bladder is nondistended. Calcifications within the pelvis  consistent with phleboliths. Hysterectomy.    No significant adenopathy. Abdominal aorta normal caliber. No  aneurysm. Prominent aortoiliac atherosclerotic vascular  calcification. Prominent visceral atherosclerotic vascular  calcification.    The appendix is not visualized. The colon is diffusely dilated to  the level of the rectum with a large amount of fluid in the colon.  Cecal diameter up to  approximately 9 cm noted. Associated small  bowel distention present. Degree of small-bowel distention is less  than that of colon. These findings are most consistent with adynamic  ileus primarily involving the colon. No pneumatosis. No portal  venous air. NG tube noted in the stomach. Stomach is decompressed.  Small sliding hiatal hernia is present.    Coronary artery disease. Prior CABG. Left lower lobe infiltrate  and/or atelectasis with left-sided pleural effusion. Degenerative  changes lumbar spine. Mild T12 compression. Diffuse mild anasarca.     IMPRESSION:  1. Findings consistent with prominent colonic ileus. These results  were called by telephone at the time of interpretation on 09/07/2013  at 3:28 PM to Dr. Marlyce Huge , who verbally acknowledged  these results.    2. Left lower lobe atelectasis and/or infiltrate with small left  pleural effusion.      Electronically Signed    By: Marcello Moores  Register    On: 09/07/2013 15:30         Verified By: Osa Craver, M.D., MD   Assessment/Plan:  Assessment/Plan:  Assessment Ogilvie's syndrome/ileus:  Likely secondary to post-op, electrolyte imbalance (K/Mag) & immobility.  WIll add colonic decompression. Hypokalemia/Hypomagnesemia: K 2.8 for past 3 days, will need addtional repletion per attending's orders.  Mag 3.4.   Plan 1) Rectal tube for 97mnutes with position change every 2 hrs from left/back/right/repeat.  [place], sports 2) Electrolyte repletion per attending 3) Avoid narcotics Please call if you have any questions or concerns   Electronic Signatures: JAndria Meuse(NP)  (Signed 13-Jul-15 09:26)  Authored: Chief Complaint, VITAL SIGNS/ANCILLARY NOTES, Brief Assessment, Lab Results, Radiology Results, Assessment/Plan   Last Updated: 13-Jul-15 09:26 by JAndria Meuse(NP)

## 2014-06-20 NOTE — Consult Note (Signed)
Chief Complaint:  Subjective/Chief Complaint Distention improved with decompression.  Proctalgia improved.  Denies nausea or vomiting.  +anorexia, but feelsw like she can eat yogurt.   VITAL SIGNS/ANCILLARY NOTES: **Vital Signs.:   17-Jul-15 10:16  Vital Signs Type Routine  Temperature Temperature (F) 98  Celsius 36.6  Temperature Source oral  Pulse Pulse 68  Respirations Respirations 18  Systolic BP Systolic BP 242  Diastolic BP (mmHg) Diastolic BP (mmHg) 71  Mean BP 90  BP Source  if not from Vital Sign Device non-invasive  Pulse Ox % Pulse Ox % 96  Pulse Ox Activity Level  At rest  Oxygen Delivery Room Air/ 21 %   Brief Assessment:  GEN well developed, no acute distress, A/Ox3.   Cardiac Regular   Respiratory normal resp effort   Gastrointestinal details normal Bowel sounds normal  No rebound tenderness  No gaurding  +Moderate distention improving.  +mild tenderness entire abdomen. +flexiseal with good soft brown stool output   EXTR negative cyanosis/clubbing, negative edema   Additional Physical Exam Skin: pale, warm, dry   Lab Results: Routine Chem:  17-Jul-15 04:47   Glucose, Serum  101  BUN 13  Creatinine (comp) 1.11  Sodium, Serum 138  Potassium, Serum  3.2  Chloride, Serum  114  CO2, Serum 21  Calcium (Total), Serum  7.8  Anion Gap  3  Osmolality (calc) 276  eGFR (African American)  54  eGFR (Non-African American)  46 (eGFR values <68m/min/1.73 m2 may be an indication of chronic kidney disease (CKD). Calculated eGFR is useful in patients with stable renal function. The eGFR calculation will not be reliable in acutely ill patients when serum creatinine is changing rapidly. It is not useful in  patients on dialysis. The eGFR calculation may not be applicable to patients at the low and high extremes of body sizes, pregnant women, and vegetarians.)  Result Comment PT/INR - RESULTS VERIFIED BY REPEAT TESTING.  - NOTIFIED OF CRITICAL VALUE  - NOTIFIED  DEBRA GLADDING AT [id number]  - 09/12/13..[place] - READ-BACK PROCESS PERFORMED.  Result(s) reported on 12 Sep 2013 at 06:18AM.  Routine Coag:  17-Jul-15 04:47   Prothrombin  43.4  INR  4.8 (INR reference interval applies to patients on anticoagulant therapy. A single INR therapeutic range for coumarins is not optimal for all indications; however, the suggested range for most indications is 2.0 - 3.0. Exceptions to the INR Reference Range may include: Prosthetic heart valves, acute myocardial infarction, prevention of myocardial infarction, and combinations of aspirin and anticoagulant. The need for a higher or lower target INR must be assessed individually. Reference: The Pharmacology and Management of the Vitamin K  antagonists: the seventh ACCP Conference on Antithrombotic and Thrombolytic Therapy. CHDQQI.2979Sept:126 (3suppl): [id number] A HCT value >55% may artifactually increase the PT.  In one study,  the increase was an average of 25%. 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   Plan 1) continue q2hr position changes with rectal tube for 33ms with each change 2) continue supportive measures [facility] if [facility] services are needed.  Please call if you have any questions or concerns.   Electronic Signatures: JoAndria MeuseNP)  (Signed 17-Jul-15 12:29)  Authored: Chief Complaint, VITAL SIGNS/ANCILLARY NOTES, Brief Assessment, Lab Results, Assessment/Plan   Last  Updated: 17-Jul-15 12:29 by Andria Meuse (NP)

## 2014-06-20 NOTE — H&P (Signed)
PATIENT NAME:  Molly [name], Molly [name] MR#:  [id number] DATE OF BIRTH:  05/05/1928  DATE OF ADMISSION:  07/23/2013  PRIMARY CARE PHYSICIAN: Dr. Benita Stabile   REFERRING EMERGENCY ROOM PHYSICIAN: Dr. Conni Slipper.   CHIEF COMPLAINT: Shortness of breath.   HISTORY OF PRESENT ILLNESS: An 81 year old female with a history of hypertension, hyperlipidemia, diabetes, who is living an active and healthy life, walking her dog, doing household work without any trouble or chest pain any time in the past. This morning, she woke up around 7:00 and was doing her daily routine work and suddenly started feeling short of breath She tried taking some rest, but did not relieve and so decided to call 911 and came to the Emergency Room. On further questioning, she denies any palpitation or denies any feeling chest pain, but was just feeling some heaviness because of shortness of breath. She denies any cough, sputum production or fever. She had some swelling on her legs. She said that she was taking Lasix in the past, but since almost like a year to 2, she is not taking Lasix, was taken off by Dr. Hall Busing, but was feeling fine without that. In the ER, she was found having elevated BNP level and chest x-ray showed some congestion, so given injection of Lasix and given to hospitalist team for admission for CHF.  REVIEW OF SYSTEMS:  CONSTITUTIONAL: Negative for fever, fatigue, weakness, pain or weight loss.  EYES: No blurring, double vision, discharge or redness.  EARS, NOSE, THROAT: No tinnitus, ear pain or hearing loss.  RESPIRATORY: The patient has some shortness of breath. No cough, wheezing.  CARDIOVASCULAR: No palpitations. Has some chest heaviness, but no pain. No dizziness or syncopal episode, but had some swelling around her ankles.  GASTROINTESTINAL: No nausea, vomiting, diarrhea, abdominal pain.  GENITOURINARY: No dysuria, hematuria or increased frequency.  ENDOCRINE: No heat or cold intolerance. No excessive sweating or  cold.  SKIN: No acne, rashes or lesions.  MUSCULOSKELETAL: No pain or swelling in the joints.  NEUROLOGICAL: No numbness, weakness, tremor or vertigo.  PSYCHIATRIC: No anxiety, insomnia, bipolar disorder.   PAST MEDICAL HISTORY:  1. Hypertension.  2. Diabetes.  3. Hyperlipidemia.   ALLERGIES: No known drug allergies.   SOCIAL HISTORY: No smoking, drinks alcohol very rarely, socially. She lives home by herself. 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   FAMILY HISTORY: Positive for diabetes in multiple family members.   HOME MEDICATIONS:  1. Potassium chloride 20 mEq once a day.  2. Metformin 500 mg oral 2 times a day.  3. Magnesium oxide 400 mg 2 times a day.  4. Crestor 20 mg once a day.   PHYSICAL EXAMINATION:  VITAL SIGNS: Temperature 98, pulse 78, respirations 18, blood pressure 168/93 and pulse oximetry is 97.  GENERAL: The patient is fully alert and oriented to time, place and person. Does not appear in any acute distress.  HEENT: Head and neck atraumatic. Conjunctivae pink. Oral mucosa moist.  NECK: Supple. No JVD.  RESPIRATORY: Bilateral equal air entry, but crepitation present.  CARDIOVASCULAR: S1, S2 present, regular. No murmur.  ABDOMEN: Soft, nontender. Bowel sounds present. No organomegaly.  SKIN: No rashes. Skin turgor good.  MUSCULOSKELETAL: Legs: Edema around ankles present. Joints: No swelling or tenderness.  NEUROLOGICAL: Moves all 4 limbs. Power 5/5. No tremor or rigidity. Follows commands.  PSYCHIATRIC: Does  not appear in any acute psychiatric illness at this time.   IMPORTANT LABORATORY RESULTS: Glucose 184. BNP is 2958, BUN 7, creatinine 0.72. Sodium 136, potassium is 3.4, chloride is 102, CO2 of 22. Troponin 0.09. TSH 2.68. WBC 7.8. Hemoglobin is 13.8, platelet count is 156, MCV is 97. Chest x-ray, PA and lateral, shows evidence of a  degree of congestive heart failure. No airspace consolidation, increased anterior wedging of lower thoracic vertebral body.   ASSESSMENT AND PLAN: An 81 year old female with past medical history of hypertension, hyperlipidemia and diabetes, presented to Emergency Room with sudden onset worsening shortness of breath and found having elevated BNP and some leg edema.   1. Acute congestive heart failure. We do not have a previous echocardiogram or any reports of ejection fraction so do not know diastolic versus systolic at this time. We will order an echocardiogram and admit to telemetry, follow serial troponins and will give IV Lasix b.i.d.  2. Diabetes. Will continue metformin 500 mg oral b.i.d. and will give insulin sliding scale coverage after checking fingerstick.  3. Hyperlipidemia. Will continue statin as she is taking at home.  4. Hypertension. Will give metoprolol 25 mg b.i.d. and she is on furosemide IV for congestive heart failure.  5. Deep vein thrombosis prophylaxis. Heparin subcutaneous.   CODE STATUS: FULL CODE.    TOTAL TIME SPENT ON THIS ADMISSION: 50 minutes.   ____________________________ Ceasar Lund Anselm Jungling, MD vgv:st D: 07/23/2013 15:53:19 ET T: 07/23/2013 17:31:09 ET JOB#: [id number]  cc: Ceasar Lund. Anselm Jungling, MD, <Dictator> Leona Carry. Hall Busing, MD Vaughan Basta MD ELECTRONICALLY SIGNED 08/13/2013 9:01

## 2014-06-20 NOTE — Consult Note (Signed)
PATIENT NAME:  Molly [name], Molly [name] MR#:  [id number] DATE OF BIRTH:  April 07, 1928  DATE OF CONSULTATION:  07/24/2013  REFERRING PHYSICIAN:   CONSULTING PHYSICIAN:  Dionisio David, MD  INDICATION FOR CONSULTATION: Elevated troponin, abnormal EKG, congestive heart failure and chest pain.   HISTORY OF PRESENT ILLNESS: This is an 81 year old white female with a past medical history of hypertension, hyperlipidemia, diabetes mellitus, who presented to the Emergency Room yesterday with severe, all of a sudden, chest pain and shortness of breath. She had severe bout of shortness of breath associated with tightness in the chest. Her BNP was elevated. Chest x-ray showed congestion. She was given Lasix and was admitted to the hospital. Echo was done that shows ejection fraction 25% with diffuse hypokinesis.   PAST MEDICAL HISTORY: History of hypertension, diabetes and hyperlipidemia.   ALLERGIES: None.   SOCIAL HISTORY: She denies EtOH abuse or smoking.   FAMILY HISTORY: Positive for coronary artery disease.   MEDICATIONS: Lasix, potassium, metformin and magnesium at home.   PHYSICAL EXAMINATION:  GENERAL: She is alert, oriented x 3, in no acute distress.  VITAL SIGNS: Blood pressure is 121/67, respirations 17, pulse 56, temperature 97.4, saturation 98.  NECK: 8 cm JVD.  LUNGS: Good air entry. No rales.  HEART: Regular rate, rhythm. Normal S1, S2. No audible murmur.  ABDOMEN: Soft, nontender, positive bowel sounds.  EXTREMITIES: 1+ pedal edema.   EKG shows sinus rhythm, 94 beats per minute, LVH, left axis deviation, old inferior wall myocardial infarction. Troponin peak was at 1.8. BNP was 2958. BUN and creatinine are normal. CBC had a platelet count of 156.   ASSESSMENT AND PLAN: The patient has congestive heart failure with severe left ventricular dysfunction, non-ST segment elevation myocardial infarction, elevated troponin with acute shortness of breath leading to pulmonary edema with chest pain  intermittently. Advise further evaluation for coronary artery disease. She has a lot of risk factors. Will proceed with cardiac catheterization.  ____________________________ Dionisio David, MD sak:aw D: 07/24/2013 08:24:08 ET T: 07/24/2013 08:40:24 ET JOB#: [id number]  cc: Dionisio David, MD, <Dictator> Dionisio David MD ELECTRONICALLY SIGNED 07/31/2013 10:23

## 2014-06-20 NOTE — H&P (Signed)
PATIENT NAME:  Molly [name], POTENZA MR#:  [id number] DATE OF BIRTH:  December 29, 1928  DATE OF ADMISSION:  08/27/2013  PRIMARY CARE PHYSICIAN:  Dr. Benita Stabile.   REFERRING PHYSICIAN:  Dr. Thomasene Lot.   CHIEF COMPLAINT:  Sent in from [facility] for renal failure.   HISTORY OF PRESENT ILLNESS:  The patient is a pleasant 81 year old Caucasian female who was recently hospitalized here in late May for chest pain that turned out to be severe triple vessel disease and the patient was transferred to [facility]. It appears that she underwent a quadruple bypass surgery and was discharged to [facility] where she has been for the past 3 weeks or so.  Apparently, she was doing well at [facility] post surgery. The wound is healing. While at [facility], the patient has had poor p.o. intake, not taking much fluid or water, per daughter-in-law who was in the room. The patient feels fine. She is awake, alert. The patient has been having bouts of nausea and vomiting and has been having increased disorientation. Some labs were drawn today and it showed the patient to have severe renal failure with a GFR of about 5, BUN of 75 and creatinine of 7.55. There is no significant leukocytosis. A Foley was placed. Currently, there is about 100 mL of urine; this is probably within the last 8 hours or so, and hospitalist service was contacted for  further evaluation and management here. There were no labs drawn at the hospital, as the labs were drawn earlier in the morning. The patient denies having any shortness of breath or any pains in the chest.   PAST MEDICAL HISTORY:  1.  History of hypertension.  2.  Diabetes.  3.  Hyperlipidemia.  4.  Ischemic cardiomyopathy with severe congestive heart failure, EF of about 25% to 30%.  5.  Recent non-ST elevation MI. Cardiac cath showed severe triple vessel disease and the patient underwent successful CABG.  6.  History of Afib.  7.  Chronic systolic/diastolic CHF.   ALLERGIES:  No known drug allergies.    FAMILY HISTORY:  Positive for diabetes.   SOCIAL HISTORY:  No tobacco, alcohol or drug use. Currently is in [place] for rehab. Used to work in a Clinical cytogeneticist, is retired.   OUTPATIENT MEDICATIONS: Amiodarone 200 mg daily, aspirin 81 mg daily, bisacodyl suppository 10 mg once a day, Crestor 40 mg daily, hydrochlorothiazide/triamterene 25/37.5 mg once a day, lisinopril 2.5 mg daily, metformin 500 mg 2 tabs 2 times a day, metoprolol 25 mg 2 times a day, propranolol 20 mg 2 times a day, Topamax 25 mg 2 times a day, tramadol 50 mg every 6 hours as needed.   REVIEW OF SYSTEMS:   CONSTITUTIONAL:  Positive for some weight loss and chronic headaches.  EYES:  No blurry vision or double vision.  ENT: No tinnitus or hearing loss.  RESPIRATORY: No cough or wheezing. Has dyspnea on exertion.  CARDIOVASCULAR: No chest pain or palpitations. Swelling in the legs is improved post surgery. Has  paroxysmal Afib and dyspnea on exertion.  GASTROINTESTINAL: Has nausea, vomited recently in the last few days. No abdominal pain, but she has constipation.  GENITOURINARY: Denies dysuria or hematuria.  HEMATOLOGIC AND LYMPHATIC: Denies anemia or easy bruising.  SKIN: No rashes.  MUSCULOSKELETAL: Denies arthritis or gout.  NEUROLOGIC: No focal weakness or numbness.  PSYCHIATRIC: Denies anxiety or insomnia, but per daughter-in-law, the patient might have some hints of early dementia.   PHYSICAL EXAMINATION: VITAL SIGNS: Temperature on arrival 97.8,  pulse rate of 116, respiratory rate 20, blood pressure 135/70, O2 sat 99% on room air.  GENERAL: The patient is a well-developed, pleasant, cooperative female lying in bed, no obvious distress, talking in full sentences.  HEENT: Normocephalic, atraumatic. Pupils are equal and reactive. Anicteric sclerae. A little dry mucous membranes.  NECK: Supple. No thyroid tenderness. No cervical lymphadenopathy.  CARDIOVASCULAR: S1 and S2 appears to be regular. No significant  murmurs.  There is a healed sternal scar.  LUNGS: Clear to auscultation without wheezing, rhonchi or rales.  ABDOMEN: Soft, a little distended, a little hyperactive and tympanic. No significant tenderness. No rebound or guarding.  EXTREMITIES: No significant pitting edema.  NEUROLOGIC: Cranial nerves are grossly intact. Strength is 5/5 in all extremities. Sensation is intact to light touch.  PSYCHIATRIC: Awake, alert, oriented x 2. She knows she is in the hospital. She thinks this is August 1st. Oriented to person.   LABORATORIES AND IMAGING:  UA shows no nitrites and trace leukocyte esterase, 5 WBC, 3+ bacteria. White count of 11, hemoglobin 10.4, platelets 177, all from earlier this morning. TSH was 3.45. LFTs showed AST of 40, otherwise within normal limits. Mag was 1.3. Glucose 89, BUN 75, creatinine 7.55, sodium 129, potassium 4.3. Serum CO2 of 18. X-ray of the chest, 1 view, showing heart is enlarged, linear atelectasis or scarring of the lingula, otherwise lungs are clear.     ASSESSMENT AND PLAN: We have a pleasant 81 year old who has a recent quadruple bypass surgery earlier in June, who was transferred to [facility] for rehab, who comes in with poor p.o. intake, more recently confusion and some signs of possible mild uremia. Currently, she is awake, alert, oriented x 2, pleasant and cooperative. She does not appear to be close grossly volume overloaded nor severely acidotic. She has severe renal failure, but she is oligoric. She has had about 100 mL in the last 7 or 8 hours. At this point, I do not think that she needs an urgent dialysis. I suspect her acute renal failure is likely multifactorial, possibly ATN in the setting of poor p.o. intake and some medications likely. At this point, I would start the patient on gentle IV fluids, watch her carefully as she has congestive heart failure, and this was discussed with family. Would also check an ultrasound of the kidneys to look for significant  hydronephrosis; however, she has a Foley, which is draining. I would obtain a nephrology consult, hold all nephrotoxin medications including metformin, ACE inhibitor, diuretics and dose Topamax accordingly. I would recheck another BMP tomorrow. Would continue her beta blockers, amiodarone and aspirin at this point. Start her on heparin for DVT prophylaxis and sliding scale insulin for diabetes. At this point, the patient is FULL CODE until family can find her living will. The patient does have a little confusion. This could be metabolic encephalopathy from uremia. She is neurologically intact. She has constipation.  She will be started on bowel regimen. Her magnesium is a little low, which will be repleted. Would also send in a urine culture as she has some bacteria in the urine, but she does not have any dysuria per patient.   TOTAL TIME SPENT: About 50 minutes.     ____________________________ Vivien Presto, MD sa:dmm D: 08/27/2013 19:18:56 ET T: 08/27/2013 19:41:15 ET JOB#: [id number]  cc: Vivien Presto, MD, <Dictator> Leona Carry. Hall Busing, MD Vivien Presto MD ELECTRONICALLY SIGNED 09/05/2013 17:17

## 2014-06-20 NOTE — Discharge Summary (Signed)
PATIENT NAME:  Molly [name], Molly [name] MR#:  [id number] DATE OF BIRTH:  Dec 02, 1928  DATE OF ADMISSION:  07/23/2013 DATE OF DISCHARGE:  07/25/2013  PRIMARY CARE PHYSICIAN: Benita Stabile, MD  DISPOSITION: The patient will be getting transferred to [facility] for CABG.   CONSULTANTS: Dr. Neoma Laming from cardiology.   CHIEF COMPLAINT: Shortness of breath.   DIAGNOSES AT TIME OF TRANSFER TO [place]: 1.  Non-ST-elevation myocardial infarction.  2.  Acute systolic/diastolic congestive heart failure with ejection fraction of about 25% to 30%. 3.  Cardiac catheterization showing severe triple vessel disease with the recommendation for coronary artery bypass graft.  4.  Hypertension. 5.  Hyperlipidemia.  6.  Hypomagnesemia.   DISCHARGE MEDICATIONS: 1.  Heparin drip. 2.  Aspirin 81 mg daily. 3.  Lasix 20 mg IV q. 12 hours. 4.  Sliding scale insulin. 5.  Metformin has been held. 6.  Nitro Patch. 7.  Nitroglycerin sublingual 0.4 mg q. 5 minutes p.r.n. 8.  Potassium 20 mEq extended-release daily. 9.  Crestor 20 mg daily. 10.  Enalapril 5 mg daily. 11.  Spironolactone 25 mg daily. 12.  Carvedilol 3.125 mg b.i.d.   SIGNIFICANT LABS AND IMAGING: Cardiac cath today showing EF of about 30% with severe ostial and distal left main disease, 85% mid left circ, and occluded proximal LAD with good collaterals from distal PDA/PLV with akinesis versus severe hypokinesis of the anterior apical wall.  Echocardiogram done on May 27th showing EF of 25% to 30% with severe LV systolic dysfunction, moderately increased LV interior cavity size, elevated left atrial and left ventricular end-diastolic pressures, restrictive pattern of LV diastolic filling, severely dilated left atrium, moderate mitral valve regurg, moderate tricuspid regurg, moderate elevated pulmonary artery systolic pressure.   Initial CK-MB 6.8. Troponin 0.09. Next troponin 1.4. Third troponin 1.8. Second CK-MB of 7.1. Third CK-MB of 6. Initial BNP was  2,958. BUN 7, creatinine 0.72, potassium 3.4. LDL 86, TSH 2.68. Initial white count 7.8, hemoglobin 13.8.   HISTORY OF PRESENT ILLNESS AND HOSPITAL COURSE: For full details of H and P, please see the dictation on May 27th by Dr. Anselm Jungling, but briefly this is a pleasant 81 year old with history of hypertension, hyperlipidemia, and diabetes who is usually healthy, active, comes in with sudden feeling of shortness of breath. She came in without palpitations or feeling of chest pain, but had chest heaviness and pressure. She was admitted to the hospitalist service with elevated BNP, as dictated above, and chest x-ray initially showing some congestion. She was given Lasix and hospitalist services were contacted for further evaluation and management. Initial troponin was mildly elevated and she had elevated CK-MB fraction. Her next troponin went up to 1.4 and heparin drip had been initiated and cardiology was consulted. The patient was seen by Dr. Humphrey Rolls from cardiology and underwent an echocardiogram showing EF which was severely diminished, about 25% to 30%. Thus the patient likely had acute on chronic systolic and diastolic CHF on admission and given the positive troponin likely also had non-ST elevation MI. She was taken to cardiac catheterization showing severe 3 vessel disease and cardiologist discussed the case with [facility] who has graciously accepted the patient for evaluation for CABG. At this point, she has no significant chest pain or chest pressure. She is not on oxygen.   While here she was started on spironolactone, Vasotec and Coreg. She had bradycardia to the low 50s with Coreg and her dose has been cut back to the current dose. She was  on the 6.25 mg b.i.d. initially. At this point, or swelling in the legs is improved. Of note, she did have some redness in the left lower extremity and edema, which was more so than the right, and also underwent an ultrasound of the lower extremities which did not show  any evidence for DVT. I suspect they are from her congestive heart failure.   CODE STATUS: The patient is FULL code.   TOTAL TIME SPENT: About 55 minutes.    ____________________________ Vivien Presto, MD sa:sb D: 07/25/2013 15:56:01 ET T: 07/25/2013 16:34:29 ET JOB#: [id number]  cc: Vivien Presto, MD, <Dictator> Dionisio David, MD Leona Carry. Hall Busing, MD Vivien Presto MD ELECTRONICALLY SIGNED 08/18/2013 20:54

## 2014-06-20 NOTE — Consult Note (Signed)
Chief Complaint:  Subjective/Chief Complaint Distention improving.  Pt notes abdominal discomfort but no pain.  Denies nausea or vomiting.   VITAL SIGNS/ANCILLARY NOTES: **Vital Signs.:   15-Jul-15 13:59  Vital Signs Type Routine  Temperature Temperature (F) 99.8  Celsius 37.6  Pulse Pulse 63  Respirations Respirations 18  Systolic BP Systolic BP 470  Diastolic BP (mmHg) Diastolic BP (mmHg) 70  Mean BP 96  Pulse Ox % Pulse Ox % 97  Pulse Ox Activity Level  At rest  Oxygen Delivery Room Air/ 21 %   Brief Assessment:  GEN well developed, no acute distress, A/Ox3.   Cardiac Regular   Respiratory normal resp effort   Gastrointestinal details normal No rebound tenderness  No gaurding  +Moderate distention is improved but persistent.  +high pitched BS, +typany, +mild tenderness entire abdomen   EXTR negative cyanosis/clubbing, negative edema   Additional Physical Exam Skin: pale, warm, dry   Lab Results: Routine Chem:  15-Jul-15 05:03   Glucose, Serum  102  BUN 17  Creatinine (comp) 1.21  Sodium, Serum  146  Potassium, Serum 4.0  Chloride, Serum  120  CO2, Serum  17  Calcium (Total), Serum  8.2  Anion Gap 9  Osmolality (calc) 292  eGFR (African American)  48  eGFR (Non-African American)  42 (eGFR values <65mL/min/1.73 m2 may be an indication of chronic kidney disease (CKD). Calculated eGFR is useful in patients with stable renal function. The eGFR calculation will not be reliable in acutely ill patients when serum creatinine is changing rapidly. It is not useful in  patients on dialysis. The eGFR calculation may not be applicable to patients at the low and high extremes of body sizes, pregnant women, and vegetarians.)  Result Comment Prothrobin - RESULTS VERIFIED BY REPEAT TESTING.  - NOTIFIED OF CRITICAL VALUE  - Notified Teressa Tapp @ [phone] on 09/10/13  - SFJ.  - READ-BACK PROCESS PERFORMED.  Result(s) reported on 10 Sep 2013 at 06:31AM.  Routine Coag:   15-Jul-15 05:03   Prothrombin  41.8  INR  4.6 (INR reference interval applies to patients on anticoagulant therapy. A single INR therapeutic range for coumarins is not optimal for all indications; however, the suggested range for most indications is 2.0 - 3.0. Exceptions to the INR Reference Range may include: Prosthetic heart valves, acute myocardial infarction, prevention of myocardial infarction, and combinations of aspirin and anticoagulant. The need for a higher or lower target INR must be assessed individually. Reference: The Pharmacology and Management of the Vitamin K  antagonists: the seventh ACCP Conference on Antithrombotic and Thrombolytic Therapy. ZMOQH.4765 Sept:126 (3suppl): [id number]. A HCT value >55% may artifactually increase the PT.  In one study,  the increase was an average of 25%. Reference:  "Effect on Routine and Special Coagulation Testing Values of Citrate Anticoagulant Adjustment in Patients with High HCT Values." American Journal of Clinical Pathology 2006;126:400-405.)  Routine Hem:  15-Jul-15 05:03   WBC (CBC)  12.4  RBC (CBC)  2.65  Hemoglobin (CBC)  8.1  Hematocrit (CBC)  24.9  Platelet Count (CBC) 190  MCV 94  MCH 30.4  MCHC 32.4  RDW  15.6  Neutrophil % 80.3  Lymphocyte % 11.5  Monocyte % 6.2  Eosinophil % 1.4  Basophil % 0.6  Neutrophil #  10.0  Lymphocyte # 1.4  Monocyte # 0.8  Eosinophil # 0.2  Basophil # 0.1 (Result(s) reported on 10 Sep 2013 at 06:12AM.)   Assessment/Plan:  Assessment/Plan:  Assessment Ogilvie's syndrome/ileus:  Much  improved Hypokalemia/Hypomagnesemia: Resolved   Plan Continue supportive measures Please call if you have any questions or concerns   Electronic Signatures: Andria Meuse (NP)  (Signed 15-Jul-15 15:43)  Authored: Chief Complaint, VITAL SIGNS/ANCILLARY NOTES, Brief Assessment, Lab Results, Assessment/Plan   Last Updated: 15-Jul-15 15:43 by Andria Meuse (NP)

## 2014-06-20 NOTE — Consult Note (Signed)
Chief Complaint:  Subjective/Chief Complaint Patient with increased abd distension today. Reports pain a bit worse. NG without any significant output. Had a bowel movement yesterday.   VITAL SIGNS/ANCILLARY NOTES: **Vital Signs.:   12-Jul-15 06:00  Vital Signs Type Routine  Temperature Temperature (F) 97.9  Celsius 36.6  Temperature Source oral  Pulse Pulse 64  Respirations Respirations 20  Systolic BP Systolic BP 128  Diastolic BP (mmHg) Diastolic BP (mmHg) 63  Mean BP 89  Pulse Ox % Pulse Ox % 97  Pulse Ox Activity Level  At rest  Oxygen Delivery Room Air/ 21 %   Brief Assessment:  Cardiac Regular   Respiratory normal resp effort   Gastrointestinal details normal Decreased bowel sounds with high pitched sounds.   EXTR negative cyanosis/clubbing   Additional Physical Exam Alert and orientated times 3   Lab Results: Routine Chem:  12-Jul-15 06:07   Glucose, Serum 97  BUN 18  Creatinine (comp)  1.56  Sodium, Serum 140  Potassium, Serum  2.5  Chloride, Serum  109  CO2, Serum  18  Calcium (Total), Serum  8.2  Anion Gap 13  Osmolality (calc) 281  eGFR (African American)  35  eGFR (Non-African American)  31 (eGFR values <66m/min/1.73 m2 may be an indication of chronic kidney disease (CKD). Calculated eGFR is useful in patients with stable renal function. The eGFR calculation will not be reliable in acutely ill patients when serum creatinine is changing rapidly. It is not useful in  patients on dialysis. The eGFR calculation may not be applicable to patients at the low and high extremes of body sizes, pregnant women, and vegetarians.)  Result Comment POTASSIUM - RESULTS VERIFIED BY REPEAT TESTING.  - NOTIFIED OF CRITICAL VALUE  - C/BROOKE ROBERTSON AT 0700 09/07/13-DAS  - READ-BACK PROCESS PERFORMED.  Result(s) reported on 07 Sep 2013 at 06:47AM.   Assessment/Plan:  Assessment/Plan:  Assessment Distended abd. Ileus vs. obstruction.   Plan Patient to have CT  scan today. KUB still pending although exam appears worse. Discussed with surgery today.   Electronic Signatures: WLucilla Lame(MD)  (Signed 12-Jul-15 09:24)  Authored: Chief Complaint, VITAL SIGNS/ANCILLARY NOTES, Brief Assessment, Lab Results, Assessment/Plan   Last Updated: 12-Jul-15 09:24 by WLucilla Lame(MD)

## 2014-07-29 ENCOUNTER — Encounter
Admission: RE | Admit: 2014-07-29 | Discharge: 2014-07-29 | Disposition: A | Discharge status: Home / Self Care | Payer: Medicare Other | Source: Ambulatory Visit | Attending: Internal Medicine | Admitting: Internal Medicine

## 2014-07-29 DIAGNOSIS — I10 Essential (primary) hypertension: Secondary | ICD-10-CM | POA: Insufficient documentation

## 2014-07-29 DIAGNOSIS — I499 Cardiac arrhythmia, unspecified: Secondary | ICD-10-CM | POA: Insufficient documentation

## 2014-08-11 DIAGNOSIS — I10 Essential (primary) hypertension: Secondary | ICD-10-CM | POA: Diagnosis not present

## 2014-08-11 DIAGNOSIS — I499 Cardiac arrhythmia, unspecified: Secondary | ICD-10-CM | POA: Diagnosis not present

## 2014-08-11 LAB — BASIC METABOLIC PANEL
Anion gap: 9 (ref 5–15)
BUN: 39 mg/dL — ABNORMAL HIGH (ref 6–20)
CALCIUM: 8.9 mg/dL (ref 8.9–10.3)
CO2: 22 mmol/L (ref 22–32)
CREATININE: 1.96 mg/dL — AB (ref 0.44–1.00)
Chloride: 111 mmol/L (ref 101–111)
GFR calc Af Amer: 26 mL/min — ABNORMAL LOW (ref >60)
GFR calc non Af Amer: 22 mL/min — ABNORMAL LOW (ref >60)
GLUCOSE: 102 mg/dL — AB (ref 65–99)
Potassium: 4.2 mmol/L (ref 3.5–5.1)
SODIUM: 142 mmol/L (ref 135–145)

## 2014-08-11 LAB — MAGNESIUM: Magnesium: 2.5 mg/dL — ABNORMAL HIGH (ref 1.7–2.4)

## 2014-08-11 LAB — TSH: TSH: 2.755 u[IU]/mL (ref 0.350–4.500)

## 2014-08-18 DIAGNOSIS — N183 Chronic kidney disease, stage 3 (moderate): Secondary | ICD-10-CM | POA: Diagnosis not present

## 2014-08-18 DIAGNOSIS — E119 Type 2 diabetes mellitus without complications: Secondary | ICD-10-CM | POA: Diagnosis not present

## 2014-08-18 DIAGNOSIS — I4891 Unspecified atrial fibrillation: Secondary | ICD-10-CM | POA: Diagnosis not present

## 2014-08-18 DIAGNOSIS — I2510 Atherosclerotic heart disease of native coronary artery without angina pectoris: Secondary | ICD-10-CM | POA: Diagnosis not present

## 2014-08-28 ENCOUNTER — Encounter
Admission: RE | Admit: 2014-08-28 | Discharge: 2014-08-28 | Disposition: A | Discharge status: Home / Self Care | Payer: Medicare Other | Source: Ambulatory Visit | Attending: Internal Medicine | Admitting: Internal Medicine

## 2014-08-28 DIAGNOSIS — I499 Cardiac arrhythmia, unspecified: Secondary | ICD-10-CM | POA: Insufficient documentation

## 2014-08-28 DIAGNOSIS — I10 Essential (primary) hypertension: Secondary | ICD-10-CM | POA: Insufficient documentation

## 2014-09-28 ENCOUNTER — Encounter
Admission: RE | Admit: 2014-09-28 | Discharge: 2014-09-28 | Disposition: A | Discharge status: Home / Self Care | Payer: Medicare Other | Source: Ambulatory Visit | Attending: Internal Medicine | Admitting: Internal Medicine

## 2014-09-28 DIAGNOSIS — I5022 Chronic systolic (congestive) heart failure: Secondary | ICD-10-CM | POA: Insufficient documentation

## 2014-09-29 DIAGNOSIS — I2510 Atherosclerotic heart disease of native coronary artery without angina pectoris: Secondary | ICD-10-CM | POA: Diagnosis not present

## 2014-09-29 DIAGNOSIS — I340 Nonrheumatic mitral (valve) insufficiency: Secondary | ICD-10-CM | POA: Diagnosis not present

## 2014-09-29 DIAGNOSIS — I10 Essential (primary) hypertension: Secondary | ICD-10-CM | POA: Diagnosis not present

## 2014-09-29 DIAGNOSIS — I25810 Atherosclerosis of coronary artery bypass graft(s) without angina pectoris: Secondary | ICD-10-CM | POA: Diagnosis not present

## 2014-09-29 DIAGNOSIS — E785 Hyperlipidemia, unspecified: Secondary | ICD-10-CM | POA: Diagnosis not present

## 2014-10-13 DIAGNOSIS — M6281 Muscle weakness (generalized): Secondary | ICD-10-CM | POA: Diagnosis not present

## 2014-10-27 DIAGNOSIS — I5022 Chronic systolic (congestive) heart failure: Secondary | ICD-10-CM | POA: Diagnosis not present

## 2014-10-27 LAB — BASIC METABOLIC PANEL
ANION GAP: 9 (ref 5–15)
BUN: 36 mg/dL — ABNORMAL HIGH (ref 6–20)
CO2: 21 mmol/L — ABNORMAL LOW (ref 22–32)
Calcium: 9.1 mg/dL (ref 8.9–10.3)
Chloride: 107 mmol/L (ref 101–111)
Creatinine, Ser: 1.83 mg/dL — ABNORMAL HIGH (ref 0.44–1.00)
GFR calc Af Amer: 28 mL/min — ABNORMAL LOW (ref >60)
GFR, EST NON AFRICAN AMERICAN: 24 mL/min — AB (ref >60)
Glucose, Bld: 98 mg/dL (ref 65–99)
POTASSIUM: 4.2 mmol/L (ref 3.5–5.1)
SODIUM: 137 mmol/L (ref 135–145)

## 2014-10-29 ENCOUNTER — Encounter
Admission: RE | Admit: 2014-10-29 | Discharge: 2014-10-29 | Disposition: A | Discharge status: Home / Self Care | Payer: Medicare Other | Source: Ambulatory Visit | Attending: Internal Medicine | Admitting: Internal Medicine

## 2014-10-29 DIAGNOSIS — I10 Essential (primary) hypertension: Secondary | ICD-10-CM | POA: Insufficient documentation

## 2014-10-29 DIAGNOSIS — I499 Cardiac arrhythmia, unspecified: Secondary | ICD-10-CM | POA: Insufficient documentation

## 2014-11-10 DIAGNOSIS — N183 Chronic kidney disease, stage 3 (moderate): Secondary | ICD-10-CM | POA: Diagnosis not present

## 2014-11-10 DIAGNOSIS — M1990 Unspecified osteoarthritis, unspecified site: Secondary | ICD-10-CM | POA: Diagnosis not present

## 2014-11-10 DIAGNOSIS — E119 Type 2 diabetes mellitus without complications: Secondary | ICD-10-CM | POA: Diagnosis not present

## 2014-11-10 DIAGNOSIS — I2510 Atherosclerotic heart disease of native coronary artery without angina pectoris: Secondary | ICD-10-CM | POA: Diagnosis not present

## 2014-11-17 DIAGNOSIS — Z23 Encounter for immunization: Secondary | ICD-10-CM | POA: Diagnosis not present

## 2014-11-28 ENCOUNTER — Encounter
Admission: RE | Admit: 2014-11-28 | Discharge: 2014-11-28 | Disposition: A | Discharge status: Home / Self Care | Payer: Medicare Other | Source: Ambulatory Visit | Attending: Internal Medicine | Admitting: Internal Medicine

## 2014-12-02 DIAGNOSIS — F419 Anxiety disorder, unspecified: Secondary | ICD-10-CM | POA: Diagnosis not present

## 2014-12-02 DIAGNOSIS — R4189 Other symptoms and signs involving cognitive functions and awareness: Secondary | ICD-10-CM | POA: Diagnosis not present

## 2014-12-22 DIAGNOSIS — J40 Bronchitis, not specified as acute or chronic: Secondary | ICD-10-CM | POA: Diagnosis not present

## 2014-12-29 ENCOUNTER — Encounter
Admission: RE | Admit: 2014-12-29 | Discharge: 2014-12-29 | Disposition: A | Discharge status: Home / Self Care | Payer: Medicare Other | Source: Ambulatory Visit | Attending: Internal Medicine | Admitting: Internal Medicine

## 2015-01-11 DIAGNOSIS — R4189 Other symptoms and signs involving cognitive functions and awareness: Secondary | ICD-10-CM | POA: Diagnosis not present

## 2015-01-11 DIAGNOSIS — F419 Anxiety disorder, unspecified: Secondary | ICD-10-CM | POA: Diagnosis not present

## 2015-01-28 ENCOUNTER — Encounter
Admission: RE | Admit: 2015-01-28 | Discharge: 2015-01-28 | Disposition: A | Discharge status: Home / Self Care | Payer: Medicare Other | Source: Ambulatory Visit | Attending: Internal Medicine | Admitting: Internal Medicine

## 2015-02-08 DIAGNOSIS — R001 Bradycardia, unspecified: Secondary | ICD-10-CM | POA: Diagnosis not present

## 2015-02-10 DIAGNOSIS — I25810 Atherosclerosis of coronary artery bypass graft(s) without angina pectoris: Secondary | ICD-10-CM | POA: Diagnosis not present

## 2015-02-10 DIAGNOSIS — I10 Essential (primary) hypertension: Secondary | ICD-10-CM | POA: Diagnosis not present

## 2015-02-10 DIAGNOSIS — E785 Hyperlipidemia, unspecified: Secondary | ICD-10-CM | POA: Diagnosis not present

## 2015-02-10 DIAGNOSIS — I2510 Atherosclerotic heart disease of native coronary artery without angina pectoris: Secondary | ICD-10-CM | POA: Diagnosis not present

## 2015-02-10 DIAGNOSIS — I4891 Unspecified atrial fibrillation: Secondary | ICD-10-CM | POA: Diagnosis not present

## 2015-02-25 DIAGNOSIS — I340 Nonrheumatic mitral (valve) insufficiency: Secondary | ICD-10-CM | POA: Diagnosis not present

## 2015-02-25 DIAGNOSIS — I10 Essential (primary) hypertension: Secondary | ICD-10-CM | POA: Diagnosis not present

## 2015-02-25 DIAGNOSIS — I2510 Atherosclerotic heart disease of native coronary artery without angina pectoris: Secondary | ICD-10-CM | POA: Diagnosis not present

## 2015-02-25 DIAGNOSIS — I4891 Unspecified atrial fibrillation: Secondary | ICD-10-CM | POA: Diagnosis not present

## 2015-02-25 DIAGNOSIS — E785 Hyperlipidemia, unspecified: Secondary | ICD-10-CM | POA: Diagnosis not present

## 2015-02-28 ENCOUNTER — Encounter
Admission: RE | Admit: 2015-02-28 | Discharge: 2015-02-28 | Disposition: A | Discharge status: Home / Self Care | Payer: Medicare Other | Source: Ambulatory Visit | Attending: Internal Medicine | Admitting: Internal Medicine

## 2015-02-28 DIAGNOSIS — I509 Heart failure, unspecified: Secondary | ICD-10-CM | POA: Insufficient documentation

## 2015-02-28 DIAGNOSIS — E785 Hyperlipidemia, unspecified: Secondary | ICD-10-CM | POA: Insufficient documentation

## 2015-03-04 DIAGNOSIS — E119 Type 2 diabetes mellitus without complications: Secondary | ICD-10-CM | POA: Diagnosis not present

## 2015-03-04 DIAGNOSIS — N182 Chronic kidney disease, stage 2 (mild): Secondary | ICD-10-CM | POA: Diagnosis not present

## 2015-03-04 DIAGNOSIS — I428 Other cardiomyopathies: Secondary | ICD-10-CM | POA: Diagnosis not present

## 2015-03-04 DIAGNOSIS — I4891 Unspecified atrial fibrillation: Secondary | ICD-10-CM | POA: Diagnosis not present

## 2015-03-16 DIAGNOSIS — E785 Hyperlipidemia, unspecified: Secondary | ICD-10-CM | POA: Diagnosis not present

## 2015-03-16 DIAGNOSIS — I509 Heart failure, unspecified: Secondary | ICD-10-CM | POA: Diagnosis not present

## 2015-03-16 LAB — CBC WITH DIFFERENTIAL/PLATELET
Basophils Absolute: 0 10*3/uL (ref 0–0.1)
Basophils Relative: 1 %
Eosinophils Absolute: 0.2 10*3/uL (ref 0–0.7)
Eosinophils Relative: 4 %
HCT: 28.2 % — ABNORMAL LOW (ref 35.0–47.0)
Hemoglobin: 8.8 g/dL — ABNORMAL LOW (ref 12.0–16.0)
Lymphocytes Relative: 27 %
Lymphs Abs: 1.2 10*3/uL (ref 1.0–3.6)
MCH: 26.8 pg (ref 26.0–34.0)
MCHC: 31.3 g/dL — ABNORMAL LOW (ref 32.0–36.0)
MCV: 85.9 fL (ref 80.0–100.0)
Monocytes Absolute: 0.5 10*3/uL (ref 0.2–0.9)
Monocytes Relative: 11 %
Neutro Abs: 2.5 10*3/uL (ref 1.4–6.5)
Neutrophils Relative %: 57 %
Platelets: 148 10*3/uL — ABNORMAL LOW (ref 150–440)
RBC: 3.28 MIL/uL — ABNORMAL LOW (ref 3.80–5.20)
RDW: 16 % — ABNORMAL HIGH (ref 11.5–14.5)
WBC: 4.4 10*3/uL (ref 3.6–11.0)

## 2015-03-16 LAB — COMPREHENSIVE METABOLIC PANEL WITH GFR
ALT: 17 U/L (ref 14–54)
AST: 22 U/L (ref 15–41)
Albumin: 3.7 g/dL (ref 3.5–5.0)
Alkaline Phosphatase: 62 U/L (ref 38–126)
Anion gap: 9 (ref 5–15)
BUN: 45 mg/dL — ABNORMAL HIGH (ref 6–20)
CO2: 22 mmol/L (ref 22–32)
Calcium: 9.1 mg/dL (ref 8.9–10.3)
Chloride: 109 mmol/L (ref 101–111)
Creatinine, Ser: 2.03 mg/dL — ABNORMAL HIGH (ref 0.44–1.00)
GFR calc Af Amer: 25 mL/min — ABNORMAL LOW
GFR calc non Af Amer: 21 mL/min — ABNORMAL LOW
Glucose, Bld: 136 mg/dL — ABNORMAL HIGH (ref 65–99)
Potassium: 3.9 mmol/L (ref 3.5–5.1)
Sodium: 140 mmol/L (ref 135–145)
Total Bilirubin: 1 mg/dL (ref 0.3–1.2)
Total Protein: 6.8 g/dL (ref 6.5–8.1)

## 2015-03-16 LAB — VITAMIN B12: Vitamin B-12: 203 pg/mL (ref 180–914)

## 2015-03-16 LAB — TSH: TSH: 2.347 u[IU]/mL (ref 0.350–4.500)

## 2015-03-16 LAB — LIPID PANEL
Cholesterol: 140 mg/dL (ref 0–200)
HDL: 60 mg/dL
LDL Cholesterol: 43 mg/dL (ref 0–99)
Total CHOL/HDL Ratio: 2.3 ratio
Triglycerides: 183 mg/dL — ABNORMAL HIGH
VLDL: 37 mg/dL (ref 0–40)

## 2015-03-16 LAB — MAGNESIUM: Magnesium: 2.3 mg/dL (ref 1.7–2.4)

## 2015-03-17 LAB — VITAMIN D 25 HYDROXY (VIT D DEFICIENCY, FRACTURES): Vit D, 25-Hydroxy: 19.4 ng/mL — ABNORMAL LOW (ref 30.0–100.0)

## 2015-03-18 DIAGNOSIS — E782 Mixed hyperlipidemia: Secondary | ICD-10-CM | POA: Diagnosis not present

## 2015-03-18 DIAGNOSIS — I2510 Atherosclerotic heart disease of native coronary artery without angina pectoris: Secondary | ICD-10-CM | POA: Diagnosis not present

## 2015-03-18 DIAGNOSIS — I340 Nonrheumatic mitral (valve) insufficiency: Secondary | ICD-10-CM | POA: Diagnosis not present

## 2015-03-18 DIAGNOSIS — I4891 Unspecified atrial fibrillation: Secondary | ICD-10-CM | POA: Diagnosis not present

## 2015-03-18 DIAGNOSIS — I351 Nonrheumatic aortic (valve) insufficiency: Secondary | ICD-10-CM | POA: Diagnosis not present

## 2015-03-18 DIAGNOSIS — I10 Essential (primary) hypertension: Secondary | ICD-10-CM | POA: Diagnosis not present

## 2015-03-31 ENCOUNTER — Encounter
Admission: RE | Admit: 2015-03-31 | Discharge: 2015-03-31 | Disposition: A | Discharge status: Home / Self Care | Payer: Medicare Other | Source: Ambulatory Visit | Attending: Internal Medicine | Admitting: Internal Medicine

## 2015-04-13 DIAGNOSIS — H353131 Nonexudative age-related macular degeneration, bilateral, early dry stage: Secondary | ICD-10-CM | POA: Diagnosis not present

## 2015-04-14 DIAGNOSIS — R4189 Other symptoms and signs involving cognitive functions and awareness: Secondary | ICD-10-CM | POA: Diagnosis not present

## 2015-04-28 ENCOUNTER — Encounter
Admission: RE | Admit: 2015-04-28 | Discharge: 2015-04-28 | Disposition: A | Discharge status: Home / Self Care | Payer: Medicare Other | Source: Ambulatory Visit | Attending: Internal Medicine | Admitting: Internal Medicine

## 2015-05-05 DIAGNOSIS — I509 Heart failure, unspecified: Secondary | ICD-10-CM | POA: Diagnosis not present

## 2015-05-05 DIAGNOSIS — E119 Type 2 diabetes mellitus without complications: Secondary | ICD-10-CM | POA: Diagnosis not present

## 2015-05-05 DIAGNOSIS — I4891 Unspecified atrial fibrillation: Secondary | ICD-10-CM | POA: Diagnosis not present

## 2015-05-05 DIAGNOSIS — N183 Chronic kidney disease, stage 3 (moderate): Secondary | ICD-10-CM | POA: Diagnosis not present

## 2015-05-29 ENCOUNTER — Encounter
Admission: RE | Admit: 2015-05-29 | Discharge: 2015-05-29 | Disposition: A | Discharge status: Home / Self Care | Payer: Medicare Other | Source: Ambulatory Visit | Attending: Internal Medicine | Admitting: Internal Medicine

## 2015-06-16 DIAGNOSIS — H353133 Nonexudative age-related macular degeneration, bilateral, advanced atrophic without subfoveal involvement: Secondary | ICD-10-CM | POA: Diagnosis not present

## 2015-06-28 ENCOUNTER — Encounter
Admission: RE | Admit: 2015-06-28 | Discharge: 2015-06-28 | Disposition: A | Discharge status: Home / Self Care | Payer: Medicare Other | Source: Ambulatory Visit | Attending: Internal Medicine | Admitting: Internal Medicine

## 2015-06-28 DIAGNOSIS — D649 Anemia, unspecified: Secondary | ICD-10-CM | POA: Insufficient documentation

## 2015-06-28 DIAGNOSIS — I509 Heart failure, unspecified: Secondary | ICD-10-CM | POA: Insufficient documentation

## 2015-07-06 DIAGNOSIS — I4891 Unspecified atrial fibrillation: Secondary | ICD-10-CM | POA: Diagnosis not present

## 2015-07-06 DIAGNOSIS — I509 Heart failure, unspecified: Secondary | ICD-10-CM | POA: Diagnosis not present

## 2015-07-15 DIAGNOSIS — I509 Heart failure, unspecified: Secondary | ICD-10-CM | POA: Diagnosis not present

## 2015-07-15 DIAGNOSIS — D649 Anemia, unspecified: Secondary | ICD-10-CM | POA: Diagnosis not present

## 2015-07-15 LAB — COMPREHENSIVE METABOLIC PANEL
ALT: 18 U/L (ref 14–54)
AST: 19 U/L (ref 15–41)
Albumin: 3.2 g/dL — ABNORMAL LOW (ref 3.5–5.0)
Alkaline Phosphatase: 55 U/L (ref 38–126)
Anion gap: 7 (ref 5–15)
BUN: 40 mg/dL — ABNORMAL HIGH (ref 6–20)
CHLORIDE: 108 mmol/L (ref 101–111)
CO2: 23 mmol/L (ref 22–32)
CREATININE: 1.79 mg/dL — AB (ref 0.44–1.00)
Calcium: 8.7 mg/dL — ABNORMAL LOW (ref 8.9–10.3)
GFR calc non Af Amer: 25 mL/min — ABNORMAL LOW (ref >60)
GFR, EST AFRICAN AMERICAN: 29 mL/min — AB (ref >60)
Glucose, Bld: 111 mg/dL — ABNORMAL HIGH (ref 65–99)
Potassium: 4 mmol/L (ref 3.5–5.1)
SODIUM: 138 mmol/L (ref 135–145)
Total Bilirubin: 0.5 mg/dL (ref 0.3–1.2)
Total Protein: 5.7 g/dL — ABNORMAL LOW (ref 6.5–8.1)

## 2015-07-15 LAB — CBC WITH DIFFERENTIAL/PLATELET
BASOS ABS: 0 10*3/uL (ref 0–0.1)
BASOS PCT: 1 %
EOS ABS: 0.3 10*3/uL (ref 0–0.7)
EOS PCT: 5 %
HCT: 27.1 % — ABNORMAL LOW (ref 35.0–47.0)
Hemoglobin: 8.7 g/dL — ABNORMAL LOW (ref 12.0–16.0)
Lymphocytes Relative: 22 %
Lymphs Abs: 1.3 10*3/uL (ref 1.0–3.6)
MCH: 26.5 pg (ref 26.0–34.0)
MCHC: 32 g/dL (ref 32.0–36.0)
MCV: 82.6 fL (ref 80.0–100.0)
Monocytes Absolute: 0.5 10*3/uL (ref 0.2–0.9)
Monocytes Relative: 9 %
Neutro Abs: 3.8 10*3/uL (ref 1.4–6.5)
Neutrophils Relative %: 63 %
PLATELETS: 121 10*3/uL — AB (ref 150–440)
RBC: 3.28 MIL/uL — AB (ref 3.80–5.20)
RDW: 18.3 % — ABNORMAL HIGH (ref 11.5–14.5)
WBC: 5.9 10*3/uL (ref 3.6–11.0)

## 2015-07-15 LAB — TSH: TSH: 2.193 u[IU]/mL (ref 0.350–4.500)

## 2015-07-15 LAB — VITAMIN B12: VITAMIN B 12: 626 pg/mL (ref 180–914)

## 2015-07-15 LAB — MAGNESIUM: Magnesium: 2.4 mg/dL (ref 1.7–2.4)

## 2015-07-16 LAB — VITAMIN D 25 HYDROXY (VIT D DEFICIENCY, FRACTURES): Vit D, 25-Hydroxy: 36.8 ng/mL (ref 30.0–100.0)

## 2015-07-20 DIAGNOSIS — F0391 Unspecified dementia with behavioral disturbance: Secondary | ICD-10-CM | POA: Diagnosis not present

## 2015-07-29 ENCOUNTER — Encounter
Admission: RE | Admit: 2015-07-29 | Discharge: 2015-07-29 | Disposition: A | Discharge status: Home / Self Care | Payer: Medicare Other | Source: Ambulatory Visit | Attending: Internal Medicine | Admitting: Internal Medicine

## 2015-07-29 DIAGNOSIS — N189 Chronic kidney disease, unspecified: Secondary | ICD-10-CM | POA: Insufficient documentation

## 2015-07-29 DIAGNOSIS — D649 Anemia, unspecified: Secondary | ICD-10-CM | POA: Insufficient documentation

## 2015-07-29 DIAGNOSIS — R410 Disorientation, unspecified: Secondary | ICD-10-CM | POA: Insufficient documentation

## 2015-08-03 DIAGNOSIS — F0391 Unspecified dementia with behavioral disturbance: Secondary | ICD-10-CM | POA: Diagnosis not present

## 2015-08-16 DIAGNOSIS — D649 Anemia, unspecified: Secondary | ICD-10-CM | POA: Diagnosis not present

## 2015-08-16 DIAGNOSIS — R410 Disorientation, unspecified: Secondary | ICD-10-CM | POA: Diagnosis not present

## 2015-08-16 DIAGNOSIS — N189 Chronic kidney disease, unspecified: Secondary | ICD-10-CM | POA: Diagnosis not present

## 2015-08-16 LAB — URINALYSIS COMPLETE WITH MICROSCOPIC (ARMC ONLY)
Bilirubin Urine: NEGATIVE
GLUCOSE, UA: NEGATIVE mg/dL
Ketones, ur: NEGATIVE mg/dL
Leukocytes, UA: NEGATIVE
NITRITE: POSITIVE — AB
PH: 5 (ref 5.0–8.0)
PROTEIN: NEGATIVE mg/dL
SPECIFIC GRAVITY, URINE: 1.008 (ref 1.005–1.030)

## 2015-08-17 DIAGNOSIS — R410 Disorientation, unspecified: Secondary | ICD-10-CM | POA: Diagnosis not present

## 2015-08-17 DIAGNOSIS — N189 Chronic kidney disease, unspecified: Secondary | ICD-10-CM | POA: Diagnosis not present

## 2015-08-17 DIAGNOSIS — D649 Anemia, unspecified: Secondary | ICD-10-CM | POA: Diagnosis not present

## 2015-08-17 LAB — CBC WITH DIFFERENTIAL/PLATELET
BASOS ABS: 0 10*3/uL (ref 0–0.1)
BASOS PCT: 1 %
EOS PCT: 2 %
Eosinophils Absolute: 0.1 10*3/uL (ref 0–0.7)
HCT: 32.1 % — ABNORMAL LOW (ref 35.0–47.0)
Hemoglobin: 10.4 g/dL — ABNORMAL LOW (ref 12.0–16.0)
LYMPHS PCT: 18 %
Lymphs Abs: 1 10*3/uL (ref 1.0–3.6)
MCH: 27.5 pg (ref 26.0–34.0)
MCHC: 32.6 g/dL (ref 32.0–36.0)
MCV: 84.4 fL (ref 80.0–100.0)
Monocytes Absolute: 0.5 10*3/uL (ref 0.2–0.9)
Monocytes Relative: 9 %
NEUTROS ABS: 3.9 10*3/uL (ref 1.4–6.5)
Neutrophils Relative %: 70 %
PLATELETS: 131 10*3/uL — AB (ref 150–440)
RBC: 3.8 MIL/uL (ref 3.80–5.20)
RDW: 18.3 % — AB (ref 11.5–14.5)
WBC: 5.5 10*3/uL (ref 3.6–11.0)

## 2015-08-17 LAB — COMPREHENSIVE METABOLIC PANEL
ALBUMIN: 3.7 g/dL (ref 3.5–5.0)
ALT: 21 U/L (ref 14–54)
AST: 27 U/L (ref 15–41)
Alkaline Phosphatase: 65 U/L (ref 38–126)
Anion gap: 9 (ref 5–15)
BUN: 46 mg/dL — AB (ref 6–20)
CHLORIDE: 109 mmol/L (ref 101–111)
CO2: 23 mmol/L (ref 22–32)
CREATININE: 1.99 mg/dL — AB (ref 0.44–1.00)
Calcium: 9.1 mg/dL (ref 8.9–10.3)
GFR calc Af Amer: 25 mL/min — ABNORMAL LOW (ref >60)
GFR, EST NON AFRICAN AMERICAN: 22 mL/min — AB (ref >60)
GLUCOSE: 106 mg/dL — AB (ref 65–99)
POTASSIUM: 3.6 mmol/L (ref 3.5–5.1)
Sodium: 141 mmol/L (ref 135–145)
Total Bilirubin: 0.5 mg/dL (ref 0.3–1.2)
Total Protein: 6.5 g/dL (ref 6.5–8.1)

## 2015-08-18 LAB — URINE CULTURE

## 2015-08-28 ENCOUNTER — Encounter
Admission: RE | Admit: 2015-08-28 | Discharge: 2015-08-28 | Disposition: A | Discharge status: Home / Self Care | Payer: Medicare Other | Source: Ambulatory Visit | Attending: Internal Medicine | Admitting: Internal Medicine

## 2015-09-09 DIAGNOSIS — E1122 Type 2 diabetes mellitus with diabetic chronic kidney disease: Secondary | ICD-10-CM | POA: Diagnosis not present

## 2015-09-09 DIAGNOSIS — I509 Heart failure, unspecified: Secondary | ICD-10-CM | POA: Diagnosis not present

## 2015-09-09 DIAGNOSIS — N182 Chronic kidney disease, stage 2 (mild): Secondary | ICD-10-CM | POA: Diagnosis not present

## 2015-09-09 DIAGNOSIS — I4891 Unspecified atrial fibrillation: Secondary | ICD-10-CM | POA: Diagnosis not present

## 2015-09-28 ENCOUNTER — Encounter
Admission: RE | Admit: 2015-09-28 | Discharge: 2015-09-28 | Disposition: A | Discharge status: Home / Self Care | Payer: Medicare Other | Source: Ambulatory Visit | Attending: Internal Medicine | Admitting: Internal Medicine

## 2015-10-29 ENCOUNTER — Encounter
Admission: RE | Admit: 2015-10-29 | Discharge: 2015-10-29 | Disposition: A | Discharge status: Home / Self Care | Payer: Medicare Other | Source: Ambulatory Visit | Attending: Internal Medicine | Admitting: Internal Medicine

## 2015-10-29 DIAGNOSIS — N390 Urinary tract infection, site not specified: Secondary | ICD-10-CM | POA: Insufficient documentation

## 2015-11-18 DIAGNOSIS — N390 Urinary tract infection, site not specified: Secondary | ICD-10-CM | POA: Diagnosis not present

## 2015-11-18 LAB — URINALYSIS COMPLETE WITH MICROSCOPIC (ARMC ONLY)
BILIRUBIN URINE: NEGATIVE
Bacteria, UA: NONE SEEN
Glucose, UA: NEGATIVE mg/dL
HGB URINE DIPSTICK: NEGATIVE
KETONES UR: NEGATIVE mg/dL
NITRITE: NEGATIVE
PH: 7 (ref 5.0–8.0)
Protein, ur: 30 mg/dL — AB
SPECIFIC GRAVITY, URINE: 1.009 (ref 1.005–1.030)

## 2015-11-19 ENCOUNTER — Encounter: Payer: Self-pay | Admitting: Gerontology

## 2015-11-20 LAB — URINE CULTURE: Culture: 60000 — AB

## 2015-11-22 ENCOUNTER — Non-Acute Institutional Stay (SKILLED_NURSING_FACILITY): Payer: Medicare Other | Admitting: Gerontology

## 2015-11-22 DIAGNOSIS — M67441 Ganglion, right hand: Secondary | ICD-10-CM | POA: Diagnosis not present

## 2015-11-22 DIAGNOSIS — N390 Urinary tract infection, site not specified: Secondary | ICD-10-CM

## 2015-11-22 NOTE — Progress Notes (Signed)
Location:      Place of Service:  SNF (31) Provider:  Toni Arthurs, NP-C  Albina Billet, MD  Patient Care Team: Albina Billet, MD as PCP - General (Internal Medicine)  Extended Emergency Contact Information Primary Emergency Contact: Coble,Dean Address: [address] [place], [place] [address] [place] Phone: [phone] Relation: Son Secondary Emergency Contact: Jolaine Click Address: [address], [place] [address] Home Phone: [phone] Work Phone: [phone] Relation: None  Code Status:  Full Goals of care: Advanced Directive information Advanced Directives 07/26/2013  Does patient have an advance directive? Patient does not have advance directive  Pre-existing out of facility DNR order (yellow form or pink MOST form) No     Chief Complaint  Patient presents with  . Acute Visit    HPI:  Pt is a 82 y.o. female seen today for an acute visit for UTI. Staff reports increased confusion as of late. Urine was sent off for analysis on Friday. Patient has memory deficit. Unable to obtain accurate and complete review of systems. Vital signs stable. Patient also was concerned about a small cyst on the flexor tendon of the right thumb. Patient denies pain, edema, redness, or warmth at the site. Cyst is firm but mobile. Patient reports she has only noticed it for a few days. No other complaints.   Past Medical History:  Diagnosis Date  . Diabetes mellitus, type II   . Hx of transient ischemic attack (TIA)   . Hyperlipidemia   . Hypertension   . Varicose veins    Past Surgical History:  Procedure Laterality Date  . APPENDECTOMY    . CHOLECYSTECTOMY    . CORONARY ARTERY BYPASS GRAFT N/A 07/27/2013   Procedure: CORONARY ARTERY BYPASS GRAFTING (CABG) POSS. MITRAL REPAIR;  Surgeon: [name], MD;  Location: [facility];  Service: Open Heart Surgery;  Laterality: N/A;  Coronary artery bypass graft times four using left internal mammary artery  and right saphenous leg vein using endoscope.  Marland Kitchen KNEE SURGERY      No Known Allergies    Medication List       Accurate as of 11/22/15  6:03 PM. Always use your most recent med list.          acetaminophen 500 MG tablet Commonly known as:  TYLENOL Take 1,000 mg by mouth every 6 (six) hours as needed for headache.   amiodarone 200 MG tablet Commonly known as:  PACERONE Take 1 tablet (200 mg total) by mouth every 12 (twelve) hours. For 3 days; then on 08/05/2013, take Amiodarone 200 mg by mouth daily thereafter   aspirin 81 MG chewable tablet Chew 1 tablet (81 mg total) by mouth daily.   CRESTOR 40 MG tablet Generic drug:  rosuvastatin Take 40 mg by mouth daily.   furosemide 40 MG tablet Commonly known as:  LASIX Take 1 tablet (40 mg total) by mouth daily. For 5 days then stop.   lisinopril 2.5 MG tablet Commonly known as:  PRINIVIL,ZESTRIL Take 1 tablet (2.5 mg total) by mouth daily.   metFORMIN 500 MG 24 hr tablet Commonly known as:  GLUCOPHAGE-XR Take 1,000 mg by mouth 2 (two) times daily.   metoprolol tartrate 25 MG tablet Commonly known as:  LOPRESSOR Take 1 tablet (25 mg total) by mouth 2 (two) times daily.   potassium chloride SA 20 MEQ tablet Commonly known as:  K-DUR,KLOR-CON Take 1 tablet (20 mEq total) by mouth daily. For 5 days then stop.   traMADol 50 MG tablet Commonly known as:  ULTRAM Take 1 tablet (50 mg total) by mouth every 6 (six) hours as needed for moderate pain.   triamterene-hydrochlorothiazide 37.5-25 MG capsule Commonly known as:  DYAZIDE Take 1 capsule by mouth daily.   warfarin 2.5 MG tablet Commonly known as:  COUMADIN Take 1 tablet (2.5 mg total) by mouth daily at 6 PM. Or as directed.       Review of Systems  Constitutional: Negative for activity change, appetite change, chills, diaphoresis and fever.  Respiratory: Negative for cough, choking, chest tightness, shortness of breath and wheezing.   Cardiovascular: Negative for  chest pain, palpitations and leg swelling.  Gastrointestinal: Negative for abdominal distention, abdominal pain, constipation, diarrhea and nausea.  Genitourinary: Negative for difficulty urinating, dysuria, frequency and urgency.  Musculoskeletal: Positive for arthralgias (typical arthritis). Negative for back pain, gait problem and myalgias.  Skin: Negative for color change, pallor, rash and wound.  Neurological: Negative for dizziness, tremors, syncope, speech difficulty, weakness, numbness and headaches.  Psychiatric/Behavioral: Negative for agitation and behavioral problems.  All other systems reviewed and are negative.    There is no immunization history on file for this patient. There are no preventive care reminders to display for this patient. No flowsheet data found. Functional Status Survey:    Vitals:   11/17/15 1000  BP: 113/88  Pulse: 80  Resp: 14  Temp: 98.3 F (36.8 C)  SpO2: 96%  Weight: 229 lb 8 oz (104.1 kg)   Body mass index is 35.94 kg/m. Physical Exam  Constitutional: She is oriented to person, place, and time. Vital signs are normal. She appears well-developed and well-nourished. She is active and cooperative. She does not appear ill. No distress.  HENT:  Head: Normocephalic and atraumatic.  Mouth/Throat: Uvula is midline, oropharynx is clear and moist and mucous membranes are normal. Mucous membranes are not pale, not dry and not cyanotic.  Eyes: Conjunctivae, EOM and lids are normal. Pupils are equal, round, and reactive to light.  Neck: Trachea normal, normal range of motion and full passive range of motion without pain. Neck supple. No JVD present. No tracheal deviation, no edema and no erythema present. No thyromegaly present.  Cardiovascular: Normal rate, regular rhythm, intact distal pulses and normal pulses.  Exam reveals no gallop, no distant heart sounds and no friction rub.   No murmur heard. Pulmonary/Chest: Effort normal and breath sounds  normal. No accessory muscle usage. No respiratory distress. She has no wheezes. She has no rales. She exhibits no tenderness.  Abdominal: Normal appearance and bowel sounds are normal. She exhibits no distension and no ascites. There is no tenderness.  Musculoskeletal: Normal range of motion. She exhibits no edema or tenderness.  Expected osteoarthritis, stiffness  Neurological: She is alert and oriented to person, place, and time. She has normal strength.  Skin: Skin is warm, dry and intact. No rash noted. She is not diaphoretic. No cyanosis or erythema. No pallor. Nails show no clubbing.     Psychiatric: She has a normal mood and affect. Her speech is normal and behavior is normal. Judgment and thought content normal. Cognition and memory are normal.  Nursing note and vitals reviewed.   Labs reviewed:  Recent Labs  03/16/15 0900 07/15/15 0651 08/17/15 1208  NA 140 138 141  K 3.9 4.0 3.6  CL 109 108 109  CO2 22 23 23  GLUCOSE 136* 111* 106*  BUN 45* 40* 46*  CREATININE 2.03* 1.79* 1.99*  CALCIUM 9.1 8.7* 9.1  MG 2.3 2.4  --     Recent Labs  03/16/15 0900 07/15/15 0651 08/17/15 1208  AST 22 19 27   ALT 17 18 21   ALKPHOS 62 55 65  BILITOT 1.0 0.5 0.5  PROT 6.8 5.7* 6.5  ALBUMIN 3.7 3.2* 3.7    Recent Labs  03/16/15 0900 07/15/15 0651 08/17/15 1208  WBC 4.4 5.9 5.5  NEUTROABS 2.5 3.8 3.9  HGB 8.8* 8.7* 10.4*  HCT 28.2* 27.1* 32.1*  MCV 85.9 82.6 84.4  PLT 148* 121* 131*   Lab Results  Component Value Date   TSH 2.193 07/15/2015   Lab Results  Component Value Date   HGBA1C 6.0 (H) 07/26/2013   Lab Results  Component Value Date   CHOL 140 03/16/2015   HDL 60 03/16/2015   LDLCALC 43 03/16/2015   TRIG 183 (H) 03/16/2015   CHOLHDL 2.3 03/16/2015    Significant Diagnostic Results in last 30 days:  No results found.  Assessment/Plan 1. Urinary tract infection, site not specified  Bactrim DS 1 tab by mouth every 12 hours 5 days  Ganglion cyst of  flexor tendon sheath of finger of right hand  No treatment at this time  Reevaluate if finger becomes red and warm edematous or painful  Family/ staff Communication:   Total Time:  Documentation:  Face to Face:  Family/Phone:   Labs/tests ordered:  UA, C&S UA, C&S   Medication list reviewed and assessed for continued appropriateness.  Vikki Ports, NP-C Geriatrics [facility] [phone], [place] [address] Cell Phone (Mon-Fri 8am-5pm):  [phone] On Call:  [phone] & follow prompts after 5pm & weekends Office Phone:  [phone] Office Fax:  [phone]

## 2015-11-28 ENCOUNTER — Encounter
Admission: RE | Admit: 2015-11-28 | Discharge: 2015-11-28 | Disposition: A | Discharge status: Home / Self Care | Payer: Medicare Other | Source: Ambulatory Visit | Attending: Internal Medicine | Admitting: Internal Medicine

## 2015-11-28 NOTE — Progress Notes (Signed)
This encounter was created in error - please disregard.

## 2015-12-08 DIAGNOSIS — G308 Other Alzheimer's disease: Secondary | ICD-10-CM | POA: Diagnosis not present

## 2015-12-08 DIAGNOSIS — F29 Unspecified psychosis not due to a substance or known physiological condition: Secondary | ICD-10-CM | POA: Diagnosis not present

## 2015-12-08 DIAGNOSIS — F39 Unspecified mood [affective] disorder: Secondary | ICD-10-CM | POA: Diagnosis not present

## 2015-12-08 DIAGNOSIS — F0281 Dementia in other diseases classified elsewhere with behavioral disturbance: Secondary | ICD-10-CM | POA: Diagnosis not present

## 2015-12-15 DIAGNOSIS — H353133 Nonexudative age-related macular degeneration, bilateral, advanced atrophic without subfoveal involvement: Secondary | ICD-10-CM | POA: Diagnosis not present

## 2015-12-29 ENCOUNTER — Encounter
Admission: RE | Admit: 2015-12-29 | Discharge: 2015-12-29 | Disposition: A | Discharge status: Home / Self Care | Payer: Medicare Other | Source: Ambulatory Visit | Attending: Internal Medicine | Admitting: Internal Medicine

## 2016-01-13 DIAGNOSIS — I509 Heart failure, unspecified: Secondary | ICD-10-CM | POA: Diagnosis not present

## 2016-01-13 DIAGNOSIS — I4891 Unspecified atrial fibrillation: Secondary | ICD-10-CM | POA: Diagnosis not present

## 2016-01-13 DIAGNOSIS — N183 Chronic kidney disease, stage 3 (moderate): Secondary | ICD-10-CM | POA: Diagnosis not present

## 2016-01-13 DIAGNOSIS — E119 Type 2 diabetes mellitus without complications: Secondary | ICD-10-CM | POA: Diagnosis not present

## 2016-01-28 ENCOUNTER — Encounter
Admission: RE | Admit: 2016-01-28 | Discharge: 2016-01-28 | Disposition: A | Discharge status: Home / Self Care | Payer: Medicare Other | Source: Ambulatory Visit | Attending: Internal Medicine | Admitting: Internal Medicine

## 2016-02-28 ENCOUNTER — Encounter
Admission: RE | Admit: 2016-02-28 | Discharge: 2016-02-28 | Disposition: A | Discharge status: Home / Self Care | Payer: Medicare Other | Source: Ambulatory Visit | Attending: Internal Medicine | Admitting: Internal Medicine

## 2016-02-28 DIAGNOSIS — R509 Fever, unspecified: Secondary | ICD-10-CM | POA: Insufficient documentation

## 2016-02-28 DIAGNOSIS — R05 Cough: Secondary | ICD-10-CM | POA: Insufficient documentation

## 2016-03-07 DIAGNOSIS — G308 Other Alzheimer's disease: Secondary | ICD-10-CM | POA: Diagnosis not present

## 2016-03-07 DIAGNOSIS — F0281 Dementia in other diseases classified elsewhere with behavioral disturbance: Secondary | ICD-10-CM | POA: Diagnosis not present

## 2016-03-07 DIAGNOSIS — F39 Unspecified mood [affective] disorder: Secondary | ICD-10-CM | POA: Diagnosis not present

## 2016-03-07 DIAGNOSIS — F29 Unspecified psychosis not due to a substance or known physiological condition: Secondary | ICD-10-CM | POA: Diagnosis not present

## 2016-03-16 DIAGNOSIS — I4891 Unspecified atrial fibrillation: Secondary | ICD-10-CM | POA: Diagnosis not present

## 2016-03-16 DIAGNOSIS — I509 Heart failure, unspecified: Secondary | ICD-10-CM | POA: Diagnosis not present

## 2016-03-16 DIAGNOSIS — N183 Chronic kidney disease, stage 3 (moderate): Secondary | ICD-10-CM | POA: Diagnosis not present

## 2016-03-16 DIAGNOSIS — E119 Type 2 diabetes mellitus without complications: Secondary | ICD-10-CM | POA: Diagnosis not present

## 2016-03-22 DIAGNOSIS — R05 Cough: Secondary | ICD-10-CM | POA: Diagnosis not present

## 2016-03-22 DIAGNOSIS — R509 Fever, unspecified: Secondary | ICD-10-CM | POA: Diagnosis not present

## 2016-03-22 LAB — INFLUENZA PANEL BY PCR (TYPE A & B)
INFLAPCR: POSITIVE — AB
Influenza B By PCR: NEGATIVE

## 2016-03-30 ENCOUNTER — Encounter
Admission: RE | Admit: 2016-03-30 | Discharge: 2016-03-30 | Disposition: A | Discharge status: Home / Self Care | Payer: Medicare Other | Source: Ambulatory Visit | Attending: Internal Medicine | Admitting: Internal Medicine

## 2016-03-31 ENCOUNTER — Non-Acute Institutional Stay (SKILLED_NURSING_FACILITY): Payer: Medicare Other | Admitting: Gerontology

## 2016-03-31 DIAGNOSIS — L02831 Carbuncle of head [any part, except face]: Secondary | ICD-10-CM

## 2016-03-31 DIAGNOSIS — L02821 Furuncle of head [any part, except face]: Secondary | ICD-10-CM

## 2016-04-01 IMAGING — CR DG CHEST 2V
2 series · 2 of 2 positions shown · non-contrast
Comparison: None.

CLINICAL DATA: 84-year-old female chest pain. Preoperative study
for CABG. Initial encounter.

EXAM:
CHEST  2 VIEW

[w chest pa]
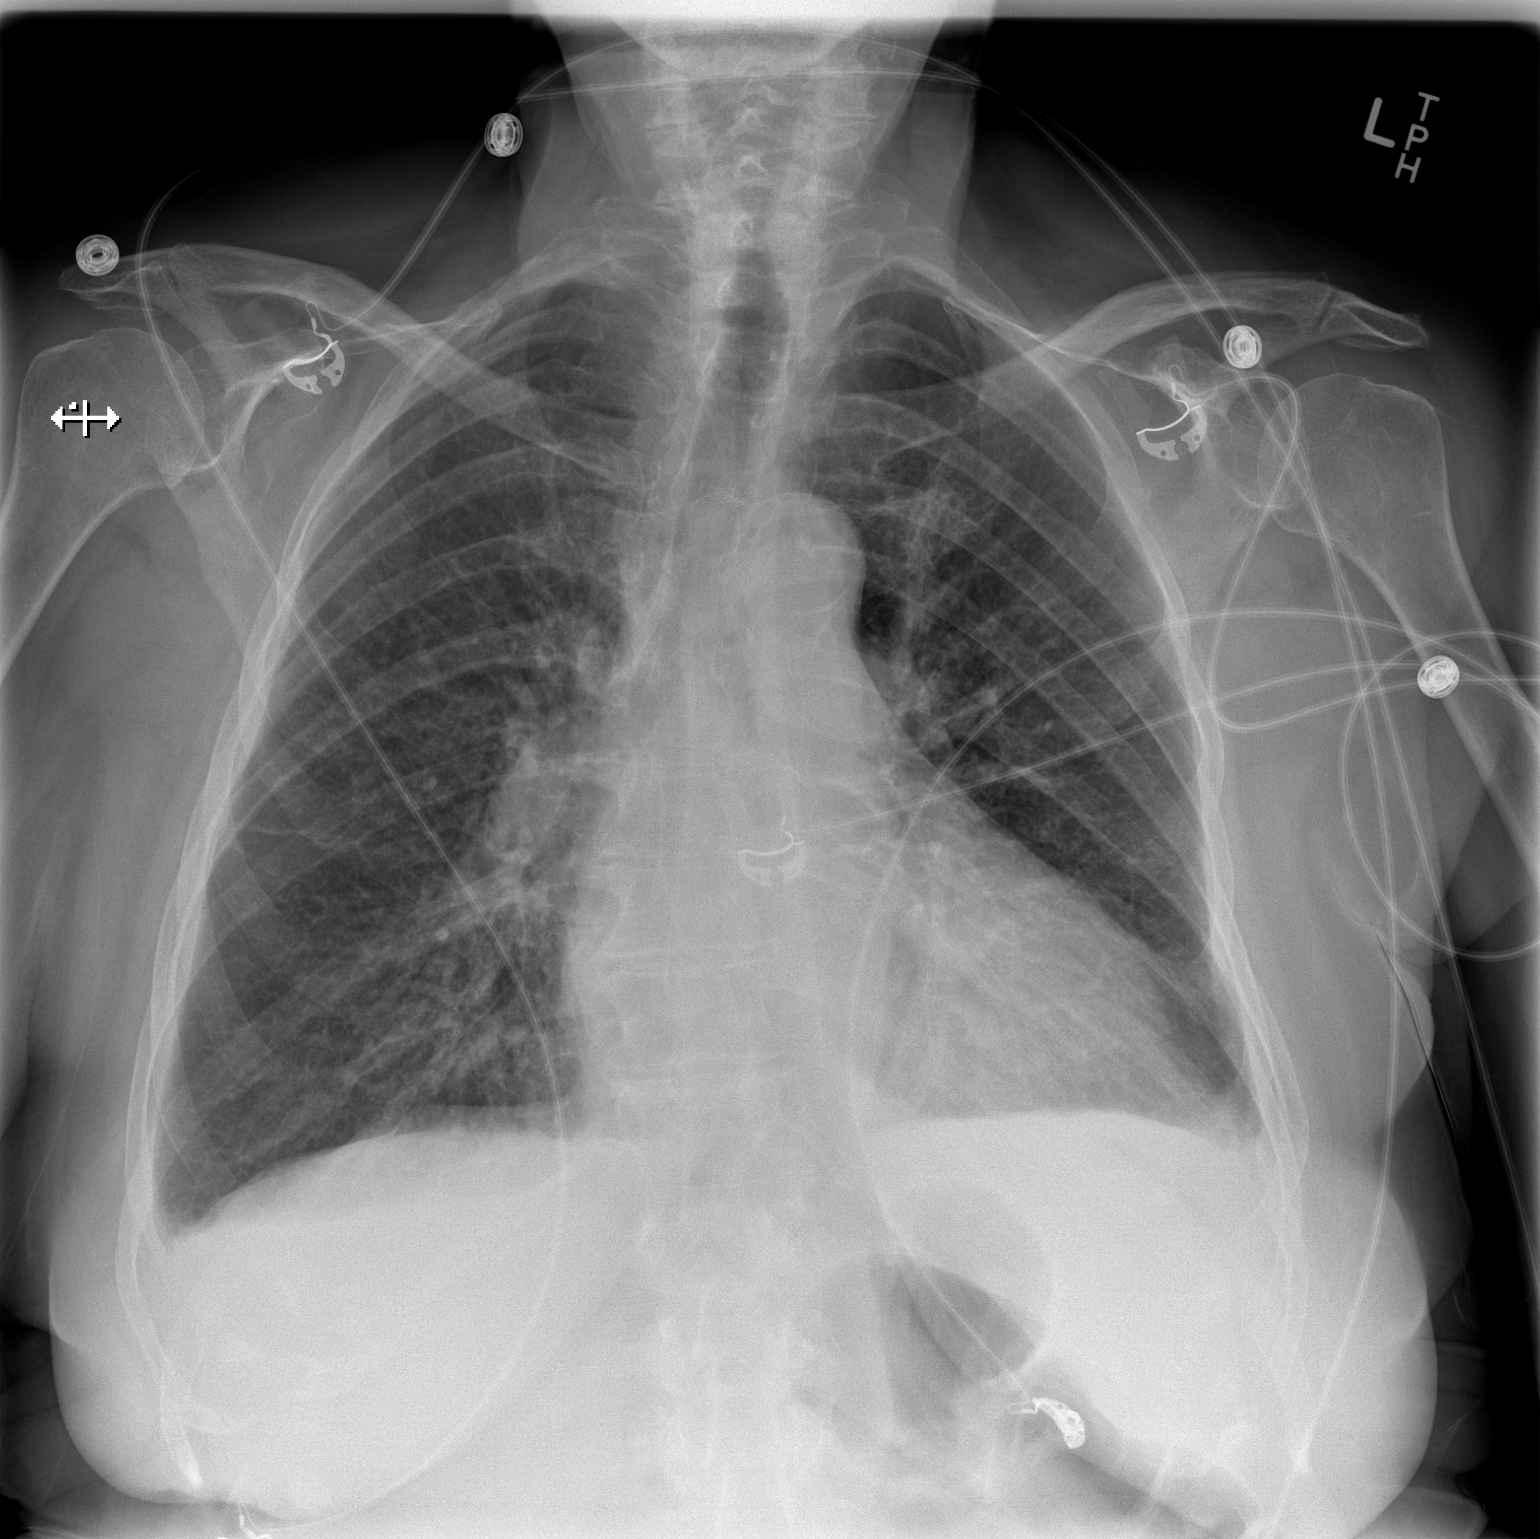

[w chest lat]
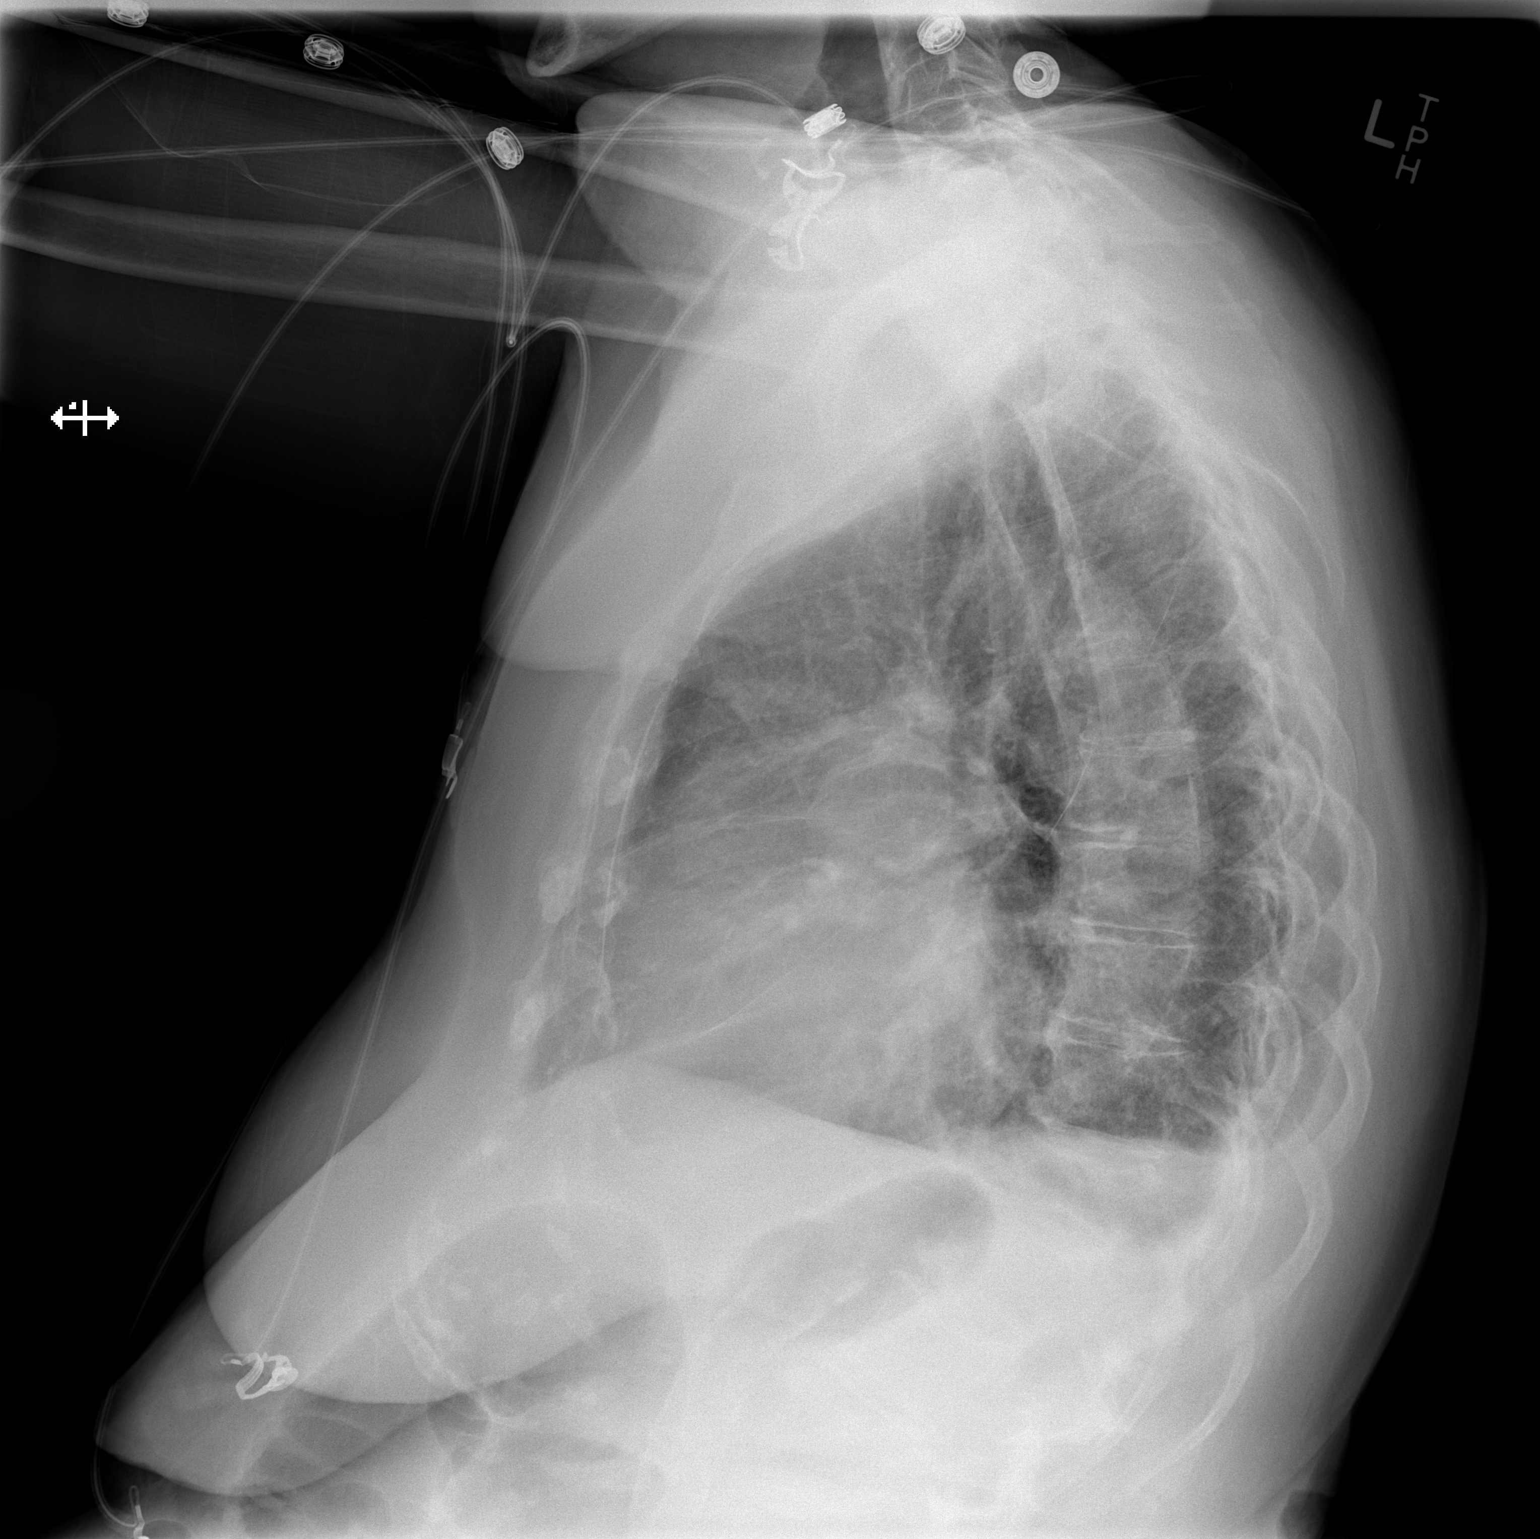

[2 of 2 positions shown; findings below may reference images not displayed]

FINDINGS: Small layering pleural effusions. There is cardiomegaly. Other
mediastinal contours are within normal limits. Visualized tracheal
air column is within normal limits. No pneumothorax. Mild pulmonary
vascular congestion without overt edema. No consolidation. Lower
thoracic compression fracture. Osteopenia.
IMPRESSION: Cardiomegaly, pulmonary vascular congestion, and small layering
pleural effusions without overt pulmonary edema.

## 2016-04-02 IMAGING — CR DG CHEST 1V PORT
1 series · 1 of 1 positions shown · non-contrast
Comparison: 07/26/2013

CLINICAL DATA: Postop CABG

EXAM:
PORTABLE CHEST - 1 VIEW

[AP]
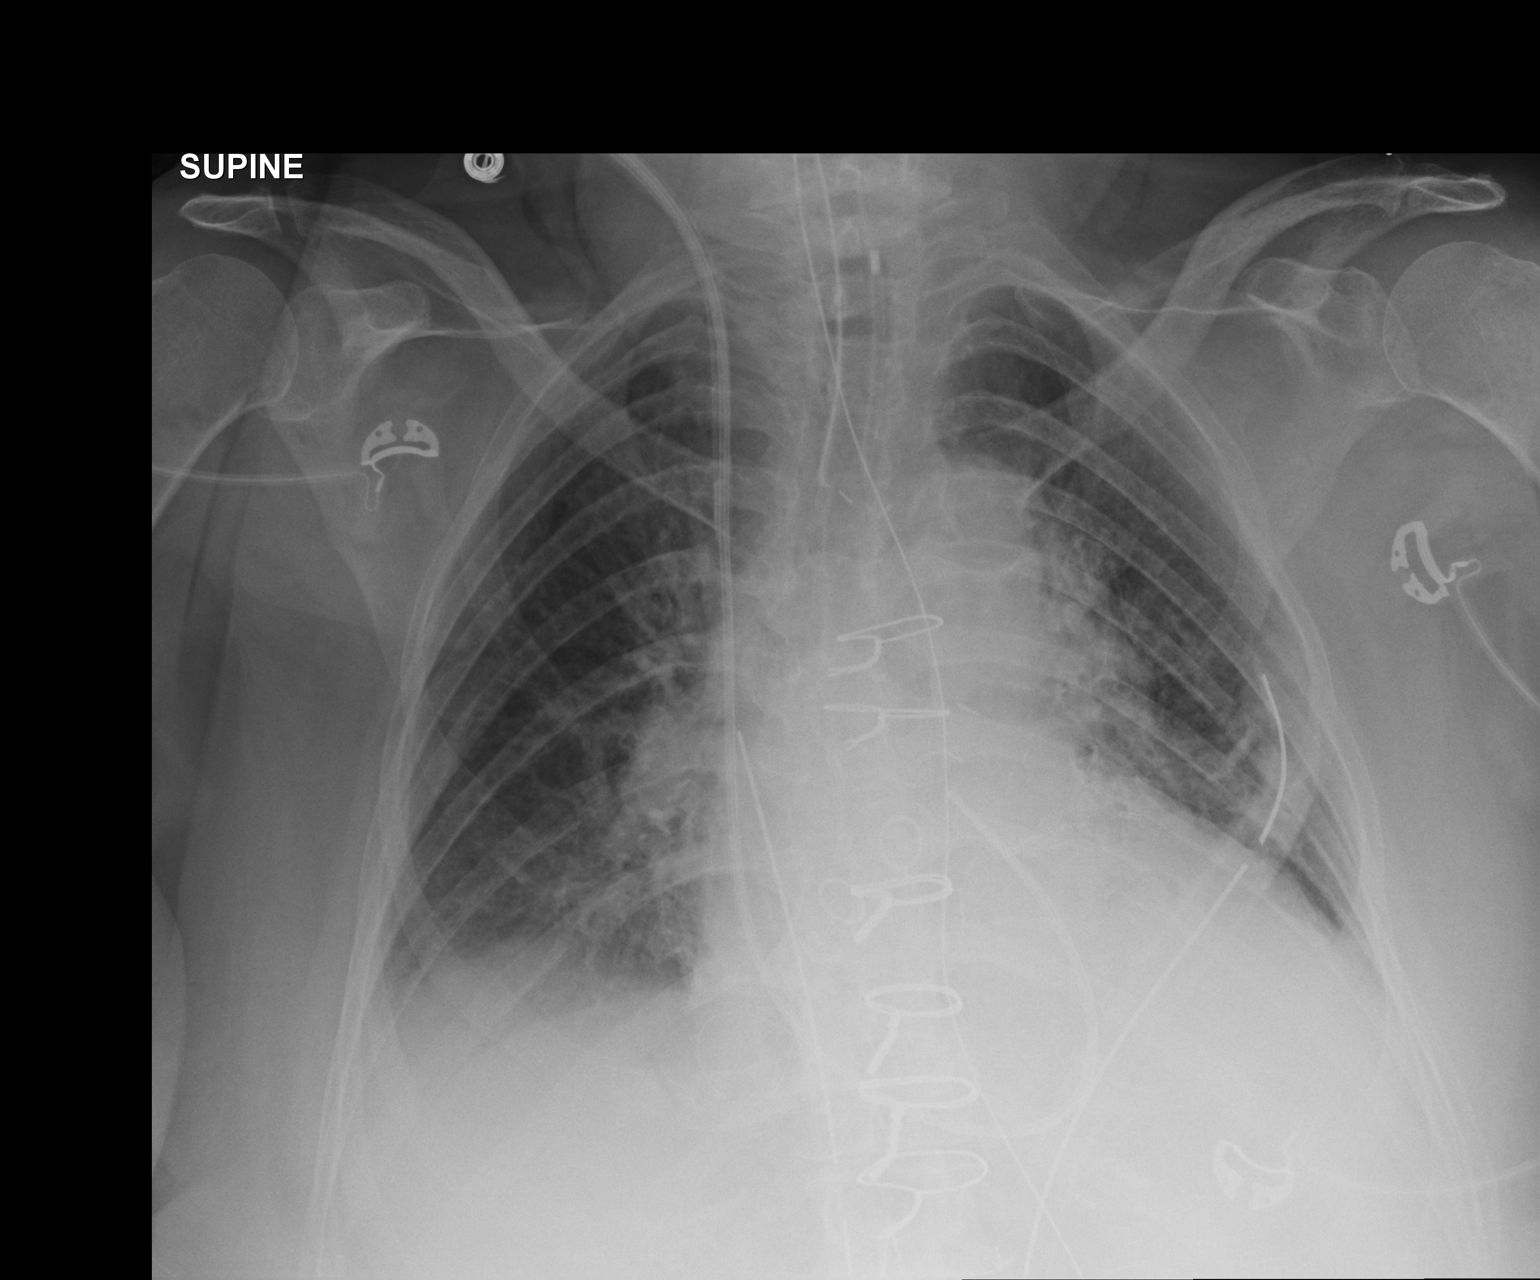

[1 of 1 positions shown; findings below may reference images not displayed]

FINDINGS: Endotracheal tube terminates 4.5 cm above the carina.

Increased interstitial markings without frank interstitial edema.
Suspected small bilateral pleural effusions, right greater than
left.

Indwelling left chest tube and mediastinal drain.  No pneumothorax.

Cardiomegaly.  Postsurgical changes related to prior CABG.

Right IJ Swan-Ganz catheter terminates in the right main pulmonary
artery.

Enteric tube courses below the diaphragm.
IMPRESSION: Endotracheal tube terminates 4.5 cm above the carina.

Indwelling left chest tube and mediastinal drain.  No pneumothorax.

Additional support apparatus as above.

Small bilateral pleural effusions, right greater than left.

## 2016-04-14 NOTE — Progress Notes (Signed)
Location:      Place of Service:  SNF (31) Provider:  Toni Arthurs, NP-C  Albina Billet, MD  Patient Care Team: Albina Billet, MD as PCP - General (Internal Medicine)  Extended Emergency Contact Information Primary Emergency Contact: Coble,Dean Address: [address], [place] [address] [place] Phone: [phone] Relation: Son Secondary Emergency Contact: Jolaine Click Address: [address] [place], [place] [address] Home Phone: [phone] Work Phone: [phone] Relation: None  Code Status:  dnr Goals of care: Advanced Directive information Advanced Directives 07/26/2013  Does Patient Have a Medical Advance Directive? Patient does not have advance directive  Pre-existing out of facility DNR order (yellow form or pink MOST form) No     Chief Complaint  Patient presents with  . Acute Visit    HPI:  Pt is a 83 y.o. female seen today for an acute visit for sudden appearance of large, area of swelling of the right side of the forehead, behind the hair-line. Area is approx. 3 x 5 cm, bulging appearance. The area is slightly red, slightly warm and tender to palpation. There is no areas of scabbing. No known trauma. No recent falls. No abrasions. No drainage. Pt has been afebrile. Denies headache, vision changes, etc. VSS. No other complaints.    Past Medical History:  Diagnosis Date  . Diabetes mellitus, type II   . Hx of transient ischemic attack (TIA)   . Hyperlipidemia   . Hypertension   . Varicose veins    Past Surgical History:  Procedure Laterality Date  . APPENDECTOMY    . CHOLECYSTECTOMY    . CORONARY ARTERY BYPASS GRAFT N/A 07/27/2013   Procedure: CORONARY ARTERY BYPASS GRAFTING (CABG) POSS. MITRAL REPAIR;  Surgeon: [name], MD;  Location: [facility];  Service: Open Heart Surgery;  Laterality: N/A;  Coronary artery bypass graft times four using left internal mammary artery and right saphenous leg vein using endoscope.  Marland Kitchen  KNEE SURGERY      No Known Allergies  Allergies as of 03/31/2016   No Known Allergies     Medication List       Accurate as of 03/31/16 11:59 PM. Always use your most recent med list.          acetaminophen 500 MG tablet Commonly known as:  TYLENOL Take 1,000 mg by mouth every 6 (six) hours as needed for headache.   amiodarone 200 MG tablet Commonly known as:  PACERONE Take 1 tablet (200 mg total) by mouth every 12 (twelve) hours. For 3 days; then on 08/05/2013, take Amiodarone 200 mg by mouth daily thereafter   aspirin 81 MG chewable tablet Chew 1 tablet (81 mg total) by mouth daily.   CRESTOR 40 MG tablet Generic drug:  rosuvastatin Take 40 mg by mouth daily.   furosemide 40 MG tablet Commonly known as:  LASIX Take 1 tablet (40 mg total) by mouth daily. For 5 days then stop.   lisinopril 2.5 MG tablet Commonly known as:  PRINIVIL,ZESTRIL Take 1 tablet (2.5 mg total) by mouth daily.   metFORMIN 500 MG 24 hr tablet Commonly known as:  GLUCOPHAGE-XR Take 1,000 mg by mouth 2 (two) times daily.   metoprolol tartrate 25 MG tablet Commonly known as:  LOPRESSOR Take 1 tablet (25 mg total) by mouth 2 (two) times daily.   potassium chloride SA 20 MEQ tablet Commonly known  as:  K-DUR,KLOR-CON Take 1 tablet (20 mEq total) by mouth daily. For 5 days then stop.   traMADol 50 MG tablet Commonly known as:  ULTRAM Take 1 tablet (50 mg total) by mouth every 6 (six) hours as needed for moderate pain.   triamterene-hydrochlorothiazide 37.5-25 MG capsule Commonly known as:  DYAZIDE Take 1 capsule by mouth daily.   warfarin 2.5 MG tablet Commonly known as:  COUMADIN Take 1 tablet (2.5 mg total) by mouth daily at 6 PM. Or as directed.       Review of Systems  Constitutional: Negative for activity change, appetite change, chills, diaphoresis and fever.  Respiratory: Negative for cough, choking, chest tightness, shortness of breath and wheezing.   Cardiovascular: Negative for  chest pain, palpitations and leg swelling.  Gastrointestinal: Negative for abdominal distention, abdominal pain, constipation, diarrhea and nausea.  Genitourinary: Negative for difficulty urinating, dysuria, frequency and urgency.  Musculoskeletal: Positive for arthralgias (typical arthritis). Negative for back pain, gait problem and myalgias.  Skin: Positive for wound. Negative for color change, pallor and rash.  Neurological: Negative for dizziness, tremors, syncope, speech difficulty, weakness, numbness and headaches.  Psychiatric/Behavioral: Negative for agitation and behavioral problems.  All other systems reviewed and are negative.    There is no immunization history on file for this patient. Pertinent  Health Maintenance Due  Topic Date Due  . DEXA SCAN  10/07/1995  . PNA vac Low Risk Adult (1 of 2 - PCV13) 10/07/1995  . INFLUENZA VACCINE  09/28/2015   No flowsheet data found. Functional Status Survey:    Vitals:   04/13/16 0700  BP: 128/72  Pulse: 94  Resp: 20  Temp: 98.4 F (36.9 C)  SpO2: 100%   There is no height or weight on file to calculate BMI. Physical Exam  Constitutional: She is oriented to person, place, and time. Vital signs are normal. She appears well-developed and well-nourished. She is active and cooperative. She does not appear ill. No distress.  HENT:  Head: Normocephalic and atraumatic.  Mouth/Throat: Uvula is midline, oropharynx is clear and moist and mucous membranes are normal. Mucous membranes are not pale, not dry and not cyanotic.  Eyes: Conjunctivae, EOM and lids are normal. Pupils are equal, round, and reactive to light.  Neck: Trachea normal, normal range of motion and full passive range of motion without pain. Neck supple. No JVD present. No tracheal deviation, no edema and no erythema present. No thyromegaly present.  Cardiovascular: Normal rate, regular rhythm, intact distal pulses and normal pulses.  Exam reveals no gallop, no distant  heart sounds and no friction rub.   No murmur heard. Pulmonary/Chest: Effort normal and breath sounds normal. No accessory muscle usage. No respiratory distress. She has no wheezes. She has no rales. She exhibits no tenderness.  Abdominal: Normal appearance and bowel sounds are normal. She exhibits no distension and no ascites. There is no tenderness.  Musculoskeletal: Normal range of motion. She exhibits no edema or tenderness.  Expected osteoarthritis, stiffness  Neurological: She is alert and oriented to person, place, and time. She has normal strength.  Skin: Skin is warm, dry and intact. No rash noted. She is not diaphoretic. There is erythema. No cyanosis. No pallor. Nails show no clubbing.     See description in the HPI  Psychiatric: She has a normal mood and affect. Her speech is normal and behavior is normal. Judgment and thought content normal. Cognition and memory are normal.  Nursing note and vitals reviewed.  Labs reviewed:  Recent Labs  07/15/15 0651 08/17/15 1208  NA 138 141  K 4.0 3.6  CL 108 109  CO2 23 23  GLUCOSE 111* 106*  BUN 40* 46*  CREATININE 1.79* 1.99*  CALCIUM 8.7* 9.1  MG 2.4  --     Recent Labs  07/15/15 0651 08/17/15 1208  AST 19 27  ALT 18 21  ALKPHOS 55 65  BILITOT 0.5 0.5  PROT 5.7* 6.5  ALBUMIN 3.2* 3.7    Recent Labs  07/15/15 0651 08/17/15 1208  WBC 5.9 5.5  NEUTROABS 3.8 3.9  HGB 8.7* 10.4*  HCT 27.1* 32.1*  MCV 82.6 84.4  PLT 121* 131*   Lab Results  Component Value Date   TSH 2.193 07/15/2015   Lab Results  Component Value Date   HGBA1C 6.0 (H) 07/26/2013   Lab Results  Component Value Date   CHOL 140 03/16/2015   HDL 60 03/16/2015   LDLCALC 43 03/16/2015   TRIG 183 (H) 03/16/2015   CHOLHDL 2.3 03/16/2015    Significant Diagnostic Results in last 30 days:  No results found.  Assessment/Plan 1. Boil of scalp  Clindamycin 300 mg po TID x 7 days  Warm, moist compresses QID until healed  Family/  staff Communication:   Total Time:  Documentation:  Face to Face:  Family/Phone:   Labs/tests ordered:    Medication list reviewed and assessed for continued appropriateness.  Vikki Ports, NP-C Geriatrics [facility] [phone], [place] [address] Cell Phone (Mon-Fri 8am-5pm):  [phone] On Call:  [phone] & follow prompts after 5pm & weekends Office Phone:  [phone] Office Fax:  [phone]

## 2016-04-27 ENCOUNTER — Encounter
Admission: RE | Admit: 2016-04-27 | Discharge: 2016-04-27 | Disposition: A | Discharge status: Home / Self Care | Payer: Medicare Other | Source: Ambulatory Visit | Attending: Internal Medicine | Admitting: Internal Medicine

## 2016-04-27 ENCOUNTER — Non-Acute Institutional Stay (SKILLED_NURSING_FACILITY): Payer: Medicare Other | Admitting: Gerontology

## 2016-04-27 DIAGNOSIS — I252 Old myocardial infarction: Secondary | ICD-10-CM | POA: Diagnosis not present

## 2016-04-27 DIAGNOSIS — F0280 Dementia in other diseases classified elsewhere without behavioral disturbance: Secondary | ICD-10-CM

## 2016-04-27 DIAGNOSIS — N182 Chronic kidney disease, stage 2 (mild): Secondary | ICD-10-CM

## 2016-04-27 DIAGNOSIS — F0390 Unspecified dementia without behavioral disturbance: Secondary | ICD-10-CM | POA: Diagnosis not present

## 2016-04-27 DIAGNOSIS — E1122 Type 2 diabetes mellitus with diabetic chronic kidney disease: Secondary | ICD-10-CM

## 2016-04-27 DIAGNOSIS — I5022 Chronic systolic (congestive) heart failure: Secondary | ICD-10-CM | POA: Diagnosis not present

## 2016-04-27 DIAGNOSIS — G308 Other Alzheimer's disease: Secondary | ICD-10-CM

## 2016-04-27 DIAGNOSIS — R001 Bradycardia, unspecified: Secondary | ICD-10-CM | POA: Diagnosis not present

## 2016-04-27 DIAGNOSIS — I110 Hypertensive heart disease with heart failure: Secondary | ICD-10-CM

## 2016-04-27 DIAGNOSIS — E785 Hyperlipidemia, unspecified: Secondary | ICD-10-CM

## 2016-04-27 DIAGNOSIS — F39 Unspecified mood [affective] disorder: Secondary | ICD-10-CM | POA: Diagnosis not present

## 2016-04-27 DIAGNOSIS — F0281 Dementia in other diseases classified elsewhere with behavioral disturbance: Secondary | ICD-10-CM

## 2016-04-27 DIAGNOSIS — Z951 Presence of aortocoronary bypass graft: Secondary | ICD-10-CM

## 2016-04-27 DIAGNOSIS — E559 Vitamin D deficiency, unspecified: Secondary | ICD-10-CM | POA: Insufficient documentation

## 2016-04-27 DIAGNOSIS — F29 Unspecified psychosis not due to a substance or known physiological condition: Secondary | ICD-10-CM | POA: Diagnosis not present

## 2016-04-27 DIAGNOSIS — E538 Deficiency of other specified B group vitamins: Secondary | ICD-10-CM

## 2016-04-27 DIAGNOSIS — I4891 Unspecified atrial fibrillation: Secondary | ICD-10-CM

## 2016-04-27 DIAGNOSIS — G43909 Migraine, unspecified, not intractable, without status migrainosus: Secondary | ICD-10-CM

## 2016-04-27 DIAGNOSIS — Z955 Presence of coronary angioplasty implant and graft: Secondary | ICD-10-CM | POA: Diagnosis not present

## 2016-04-27 DIAGNOSIS — E876 Hypokalemia: Secondary | ICD-10-CM | POA: Diagnosis not present

## 2016-04-27 DIAGNOSIS — M1990 Unspecified osteoarthritis, unspecified site: Secondary | ICD-10-CM

## 2016-04-27 DIAGNOSIS — Z8673 Personal history of transient ischemic attack (TIA), and cerebral infarction without residual deficits: Secondary | ICD-10-CM

## 2016-04-27 LAB — MAGNESIUM: MAGNESIUM: 2.4 mg/dL (ref 1.7–2.4)

## 2016-04-27 LAB — CBC WITH DIFFERENTIAL/PLATELET
Basophils Absolute: 0 10*3/uL (ref 0–0.1)
Basophils Relative: 1 %
EOS ABS: 0.2 10*3/uL (ref 0–0.7)
EOS PCT: 3 %
HCT: 31.6 % — ABNORMAL LOW (ref 35.0–47.0)
Hemoglobin: 10.7 g/dL — ABNORMAL LOW (ref 12.0–16.0)
LYMPHS ABS: 1.6 10*3/uL (ref 1.0–3.6)
Lymphocytes Relative: 24 %
MCH: 30.6 pg (ref 26.0–34.0)
MCHC: 34 g/dL (ref 32.0–36.0)
MCV: 90.1 fL (ref 80.0–100.0)
Monocytes Absolute: 0.6 10*3/uL (ref 0.2–0.9)
Monocytes Relative: 9 %
Neutro Abs: 4.2 10*3/uL (ref 1.4–6.5)
Neutrophils Relative %: 63 %
Platelets: 144 10*3/uL — ABNORMAL LOW (ref 150–440)
RBC: 3.51 MIL/uL — AB (ref 3.80–5.20)
RDW: 17.3 % — ABNORMAL HIGH (ref 11.5–14.5)
WBC: 6.6 10*3/uL (ref 3.6–11.0)

## 2016-04-27 LAB — COMPREHENSIVE METABOLIC PANEL
ALT: 19 U/L (ref 14–54)
ANION GAP: 8 (ref 5–15)
AST: 24 U/L (ref 15–41)
Albumin: 3.2 g/dL — ABNORMAL LOW (ref 3.5–5.0)
Alkaline Phosphatase: 59 U/L (ref 38–126)
BILIRUBIN TOTAL: 0.7 mg/dL (ref 0.3–1.2)
BUN: 29 mg/dL — ABNORMAL HIGH (ref 6–20)
CHLORIDE: 109 mmol/L (ref 101–111)
CO2: 23 mmol/L (ref 22–32)
Calcium: 8.7 mg/dL — ABNORMAL LOW (ref 8.9–10.3)
Creatinine, Ser: 1.6 mg/dL — ABNORMAL HIGH (ref 0.44–1.00)
GFR, EST AFRICAN AMERICAN: 33 mL/min — AB (ref >60)
GFR, EST NON AFRICAN AMERICAN: 28 mL/min — AB (ref >60)
Glucose, Bld: 108 mg/dL — ABNORMAL HIGH (ref 65–99)
POTASSIUM: 3.9 mmol/L (ref 3.5–5.1)
Sodium: 140 mmol/L (ref 135–145)
TOTAL PROTEIN: 6 g/dL — AB (ref 6.5–8.1)

## 2016-04-27 LAB — VITAMIN B12: VITAMIN B 12: 530 pg/mL (ref 180–914)

## 2016-04-27 LAB — LIPID PANEL
CHOL/HDL RATIO: 2.5 ratio
CHOLESTEROL: 142 mg/dL (ref 0–200)
HDL: 56 mg/dL (ref >40)
LDL CALC: 70 mg/dL (ref 0–99)
Triglycerides: 81 mg/dL (ref <150)
VLDL: 16 mg/dL (ref 0–40)

## 2016-04-27 LAB — VALPROIC ACID LEVEL: VALPROIC ACID LVL: 15 ug/mL — AB (ref 50.0–100.0)

## 2016-04-27 LAB — TSH: TSH: 2.774 u[IU]/mL (ref 0.350–4.500)

## 2016-04-28 LAB — VITAMIN D 25 HYDROXY (VIT D DEFICIENCY, FRACTURES): Vit D, 25-Hydroxy: 34.5 ng/mL (ref 30.0–100.0)

## 2016-05-02 DIAGNOSIS — G308 Other Alzheimer's disease: Secondary | ICD-10-CM | POA: Diagnosis not present

## 2016-05-02 DIAGNOSIS — F0281 Dementia in other diseases classified elsewhere with behavioral disturbance: Secondary | ICD-10-CM | POA: Diagnosis not present

## 2016-05-02 DIAGNOSIS — F39 Unspecified mood [affective] disorder: Secondary | ICD-10-CM | POA: Diagnosis not present

## 2016-05-19 DIAGNOSIS — R262 Difficulty in walking, not elsewhere classified: Secondary | ICD-10-CM | POA: Diagnosis not present

## 2016-05-19 DIAGNOSIS — R4189 Other symptoms and signs involving cognitive functions and awareness: Secondary | ICD-10-CM | POA: Diagnosis not present

## 2016-05-19 DIAGNOSIS — M6281 Muscle weakness (generalized): Secondary | ICD-10-CM | POA: Diagnosis not present

## 2016-05-22 DIAGNOSIS — R262 Difficulty in walking, not elsewhere classified: Secondary | ICD-10-CM | POA: Diagnosis not present

## 2016-05-22 DIAGNOSIS — M6281 Muscle weakness (generalized): Secondary | ICD-10-CM | POA: Diagnosis not present

## 2016-05-22 DIAGNOSIS — R4189 Other symptoms and signs involving cognitive functions and awareness: Secondary | ICD-10-CM | POA: Diagnosis not present

## 2016-05-23 DIAGNOSIS — R4189 Other symptoms and signs involving cognitive functions and awareness: Secondary | ICD-10-CM | POA: Diagnosis not present

## 2016-05-23 DIAGNOSIS — M6281 Muscle weakness (generalized): Secondary | ICD-10-CM | POA: Diagnosis not present

## 2016-05-23 DIAGNOSIS — R262 Difficulty in walking, not elsewhere classified: Secondary | ICD-10-CM | POA: Diagnosis not present

## 2016-05-24 DIAGNOSIS — F0281 Dementia in other diseases classified elsewhere with behavioral disturbance: Secondary | ICD-10-CM | POA: Insufficient documentation

## 2016-05-24 DIAGNOSIS — I4891 Unspecified atrial fibrillation: Secondary | ICD-10-CM | POA: Insufficient documentation

## 2016-05-24 DIAGNOSIS — I110 Hypertensive heart disease with heart failure: Secondary | ICD-10-CM | POA: Insufficient documentation

## 2016-05-24 DIAGNOSIS — E1122 Type 2 diabetes mellitus with diabetic chronic kidney disease: Secondary | ICD-10-CM | POA: Insufficient documentation

## 2016-05-24 DIAGNOSIS — G308 Other Alzheimer's disease: Secondary | ICD-10-CM

## 2016-05-24 DIAGNOSIS — I252 Old myocardial infarction: Secondary | ICD-10-CM | POA: Insufficient documentation

## 2016-05-24 DIAGNOSIS — E538 Deficiency of other specified B group vitamins: Secondary | ICD-10-CM | POA: Insufficient documentation

## 2016-05-24 DIAGNOSIS — Z8673 Personal history of transient ischemic attack (TIA), and cerebral infarction without residual deficits: Secondary | ICD-10-CM | POA: Insufficient documentation

## 2016-05-24 DIAGNOSIS — Z951 Presence of aortocoronary bypass graft: Secondary | ICD-10-CM | POA: Insufficient documentation

## 2016-05-24 DIAGNOSIS — G43909 Migraine, unspecified, not intractable, without status migrainosus: Secondary | ICD-10-CM | POA: Insufficient documentation

## 2016-05-24 DIAGNOSIS — F02818 Dementia in other diseases classified elsewhere, unspecified severity, with other behavioral disturbance: Secondary | ICD-10-CM | POA: Insufficient documentation

## 2016-05-24 DIAGNOSIS — F0280 Dementia in other diseases classified elsewhere without behavioral disturbance: Secondary | ICD-10-CM | POA: Insufficient documentation

## 2016-05-24 DIAGNOSIS — R001 Bradycardia, unspecified: Secondary | ICD-10-CM | POA: Insufficient documentation

## 2016-05-24 DIAGNOSIS — I5022 Chronic systolic (congestive) heart failure: Secondary | ICD-10-CM | POA: Insufficient documentation

## 2016-05-24 DIAGNOSIS — Z955 Presence of coronary angioplasty implant and graft: Secondary | ICD-10-CM

## 2016-05-24 DIAGNOSIS — F29 Unspecified psychosis not due to a substance or known physiological condition: Secondary | ICD-10-CM | POA: Insufficient documentation

## 2016-05-24 DIAGNOSIS — E876 Hypokalemia: Secondary | ICD-10-CM | POA: Insufficient documentation

## 2016-05-24 DIAGNOSIS — F39 Unspecified mood [affective] disorder: Secondary | ICD-10-CM | POA: Insufficient documentation

## 2016-05-24 DIAGNOSIS — M6281 Muscle weakness (generalized): Secondary | ICD-10-CM | POA: Diagnosis not present

## 2016-05-24 DIAGNOSIS — R4189 Other symptoms and signs involving cognitive functions and awareness: Secondary | ICD-10-CM | POA: Diagnosis not present

## 2016-05-24 DIAGNOSIS — E785 Hyperlipidemia, unspecified: Secondary | ICD-10-CM | POA: Insufficient documentation

## 2016-05-24 DIAGNOSIS — R262 Difficulty in walking, not elsewhere classified: Secondary | ICD-10-CM | POA: Diagnosis not present

## 2016-05-24 NOTE — Progress Notes (Signed)
Location:      Place of Service:  SNF (31) Provider:  Toni Arthurs, NP-C  Albina Billet, MD  Patient Care Team: Albina Billet, MD as PCP - General (Internal Medicine)  Extended Emergency Contact Information Primary Emergency Contact: Coble,Dean Address: [address], [place] [address] [place] Phone: [phone] Relation: Son Secondary Emergency Contact: Jolaine Click Address: [address] [place], [place] [address] Home Phone: [phone] Work Phone: [phone] Relation: None  Code Status:  dnr Goals of care: Advanced Directive information Advanced Directives 07/26/2013  Does Patient Have a Medical Advance Directive? Patient does not have advance directive  Pre-existing out of facility DNR order (yellow form or pink MOST form) No     Chief Complaint  Patient presents with  . Medicare Wellness  . Medical Management of Chronic Issues    HPI:  Pt is a 83 y.o. [name] seen today for medical management of chronic diseases. Pt has been mostly stable over the past month. However, she has began to have an increase in angry outbursts toward staff, especially towards the end of the say. She is not easily redirected when this happens. Staff has to call her son at times in order to get her settled down. Pt does not remember the incidents the following day. She is apologetic about the outbursts. She does not become physically violent, but does yell at staff and accuse them of stealing things, etc. Dose of Depakote was recently increased, which seems to have helped. [facility] follows pt. Otherwise, pt has been stable. No recent falls or UTIs. Lipids were noted to be stable on recent labs, crestor dc'ed. Other than this, pt has been stable. Pt, family or staff do not have any further concerns at this time. VSS   Past Medical History:  Diagnosis Date  . Diabetes mellitus, type II   . Hx of transient ischemic attack (TIA)   . Hyperlipidemia   .  Hypertension   . Varicose veins    Past Surgical History:  Procedure Laterality Date  . APPENDECTOMY    . CHOLECYSTECTOMY    . CORONARY ARTERY BYPASS GRAFT N/A 07/27/2013   Procedure: CORONARY ARTERY BYPASS GRAFTING (CABG) POSS. MITRAL REPAIR;  Surgeon: [name], MD;  Location: [facility];  Service: Open Heart Surgery;  Laterality: N/A;  Coronary artery bypass graft times four using left internal mammary artery and right saphenous leg vein using endoscope.  Marland Kitchen KNEE SURGERY      No Known Allergies  Allergies as of 04/27/2016   No Known Allergies     Medication List       Accurate as of 04/27/16 11:59 PM. Always use your most recent med list.          acetaminophen 500 MG tablet Commonly known as:  TYLENOL Take 1,000 mg by mouth every 6 (six) hours as needed for headache.   amiodarone 200 MG tablet Commonly known as:  PACERONE Take 1 tablet (200 mg total) by mouth every 12 (twelve) hours. For 3 days; then on 08/05/2013, take Amiodarone 200 mg by mouth daily thereafter   aspirin 81 MG chewable tablet Chew 1 tablet (81 mg total) by mouth daily.   CRESTOR 40 MG tablet Generic drug:  rosuvastatin Take 40 mg by mouth daily.   furosemide 40 MG tablet Commonly known as:  LASIX Take 1 tablet (40 mg total) by  mouth daily. For 5 days then stop.   lisinopril 2.5 MG tablet Commonly known as:  PRINIVIL,ZESTRIL Take 1 tablet (2.5 mg total) by mouth daily.   metFORMIN 500 MG 24 hr tablet Commonly known as:  GLUCOPHAGE-XR Take 1,000 mg by mouth 2 (two) times daily.   metoprolol tartrate 25 MG tablet Commonly known as:  LOPRESSOR Take 1 tablet (25 mg total) by mouth 2 (two) times daily.   potassium chloride SA 20 MEQ tablet Commonly known as:  K-DUR,KLOR-CON Take 1 tablet (20 mEq total) by mouth daily. For 5 days then stop.   traMADol 50 MG tablet Commonly known as:  ULTRAM Take 1 tablet (50 mg total) by mouth every 6 (six) hours as needed for moderate pain.     triamterene-hydrochlorothiazide 37.5-25 MG capsule Commonly known as:  DYAZIDE Take 1 capsule by mouth daily.   warfarin 2.5 MG tablet Commonly known as:  COUMADIN Take 1 tablet (2.5 mg total) by mouth daily at 6 PM. Or as directed.       Review of Systems  Constitutional: Negative for activity change, appetite change, chills, diaphoresis and fever.  Respiratory: Negative for cough, choking, chest tightness, shortness of breath and wheezing.   Cardiovascular: Negative for chest pain, palpitations and leg swelling.  Gastrointestinal: Negative for abdominal distention, abdominal pain, constipation, diarrhea and nausea.  Genitourinary: Negative for difficulty urinating, dysuria, frequency and urgency.  Musculoskeletal: Positive for arthralgias (typical arthritis). Negative for back pain, gait problem and myalgias.  Skin: Negative for color change, pallor and rash.  Neurological: Negative for dizziness, tremors, syncope, speech difficulty, weakness, numbness and headaches.  Psychiatric/Behavioral: Negative for agitation and behavioral problems.  All other systems reviewed and are negative.    There is no immunization history on file for this patient. Pertinent  Health Maintenance Due  Topic Date Due  . FOOT EXAM  10/06/1938  . OPHTHALMOLOGY EXAM  10/06/1938  . DEXA SCAN  10/07/1995  . PNA vac Low Risk Adult (1 of 2 - PCV13) 10/07/1995  . HEMOGLOBIN A1C  01/26/2014  . INFLUENZA VACCINE  09/28/2015   No flowsheet data found. Functional Status Survey:    Vitals:   04/26/16 2100  BP: (!) 108/40  Pulse: 92  Resp: (!) 24  Temp: 98.4 F (36.9 C)  SpO2: 98%   There is no height or weight on file to calculate BMI. Physical Exam  Constitutional: She is oriented to person, place, and time. Vital signs are normal. She appears well-developed and well-nourished. She is active and cooperative. She does not appear ill. No distress.  HENT:  Head: Normocephalic and atraumatic.   Mouth/Throat: Uvula is midline, oropharynx is clear and moist and mucous membranes are normal. Mucous membranes are not pale, not dry and not cyanotic.  Eyes: Conjunctivae, EOM and lids are normal. Pupils are equal, round, and reactive to light.  Neck: Trachea normal, normal range of motion and full passive range of motion without pain. Neck supple. No JVD present. No tracheal deviation, no edema and no erythema present. No thyromegaly present.  Cardiovascular: Normal rate, regular rhythm, intact distal pulses and normal pulses.  Exam reveals no gallop, no distant heart sounds and no friction rub.   No murmur heard. Pulmonary/Chest: Effort normal and breath sounds normal. No accessory muscle usage. No respiratory distress. She has no wheezes. She has no rales. She exhibits no tenderness.  Abdominal: Normal appearance and bowel sounds are normal. She exhibits no distension and no ascites. There is no tenderness.  Musculoskeletal: Normal range of motion. She exhibits no edema or tenderness.  Expected osteoarthritis, stiffness  Neurological: She is alert and oriented to person, place, and time. She has normal strength.  Skin: Skin is warm, dry and intact. No rash noted. She is not diaphoretic. No cyanosis or erythema. No pallor. Nails show no clubbing.  See description in the HPI  Psychiatric: She has a normal mood and affect. Her speech is normal and behavior is normal. Judgment and thought content normal. Cognition and memory are normal.  Nursing note and vitals reviewed.   Labs reviewed:  Recent Labs  07/15/15 0651 08/17/15 1208 04/27/16 0640  NA 138 141 140  K 4.0 3.6 3.9  CL 108 109 109  CO2 23 23 23   GLUCOSE 111* 106* 108*  BUN 40* 46* 29*  CREATININE 1.79* 1.99* 1.60*  CALCIUM 8.7* 9.1 8.7*  MG 2.4  --  2.4    Recent Labs  07/15/15 0651 08/17/15 1208 04/27/16 0640  AST 19 27 24   ALT 18 21 19   ALKPHOS 55 65 59  BILITOT 0.5 0.5 0.7  PROT 5.7* 6.5 6.0*  ALBUMIN 3.2* 3.7  3.2*    Recent Labs  07/15/15 0651 08/17/15 1208 04/27/16 0640  WBC 5.9 5.5 6.6  NEUTROABS 3.8 3.9 4.2  HGB 8.7* 10.4* 10.7*  HCT 27.1* 32.1* 31.6*  MCV 82.6 84.4 90.1  PLT 121* 131* 144*   Lab Results  Component Value Date   TSH 2.774 04/27/2016   Lab Results  Component Value Date   HGBA1C 6.0 (H) 07/26/2013   Lab Results  Component Value Date   CHOL 142 04/27/2016   HDL 56 04/27/2016   LDLCALC 70 04/27/2016   TRIG 81 04/27/2016   CHOLHDL 2.5 04/27/2016    Significant Diagnostic Results in last 30 days:  No results found.  Assessment/Plan 1. Other Alzheimer's disease  Variable  Continue Namenda XR 14 mg po Q Day   2. Dementia in other diseases classified elsewhere with behavioral disturbance  Variable  Continue Namenda XR 14 mg po Q Day  3. Unspecified psychosis not due to a substance or known physiological condition  Variable  Continue Depakote sprinkles 250 mg po BID  Continue Cholecalciferol 2,000 units po Q Day  4. Mood disorder (HCC)  Variable  Continue Depakote sprinkles 250 mg po BID  Continue Cholecalciferol 2,000 units po Q Day  5. Type 2 diabetes mellitus with stage 2 chronic kidney disease, without long-term current use of insulin (HCC)  Stable   6. Hypertensive heart disease with heart failure (HCC)  Stable  Continue Lisinopril 2.5 mg po Q Day  Continue ASA 81 mg po Q Day  7. Chronic systolic congestive heart failure (HCC)  Stable  Continue Torsemide 20 mg po Q Day  8. Presence of coronary artery bypass graft stent  Stable   9. Bradycardia  Stable   10. Deficiency of other specified B group vitamins (CODE)  Stable  Continue Cyanocobalamin 1,000 mcg po Q Day  11. Old myocardial infarction  Stable   12. Atrial fibrillation, unspecified type (HCC)  Stable  Continue Eliquis 2.5 mg po BID  13. Hyperlipidemia, unspecified hyperlipidemia type  Stable  D/C Crestor  14.  Hypomagnesemia  Stable  Continue Magnesium Oxide 400 mg po Q day  15. Hypokalemia  Stable  Continue Potassium Chloride 20 meq po Q Day  16. Personal history of transient ischemic attack (TIA), and cerebral infarction without residual deficits  Stable   17. Migraine without  status migrainosus, not intractable, unspecified migraine type  Stable  Continue Topamax 50 mg po BID  18. Osteoarthritis  Continue Tylenol 650 mg po TID  Continue Voltaren 1% gel- 4 grams to both knees QID   Family/ staff Communication:   Total Time:  Documentation:  Face to Face:  Family/Phone:   Labs/tests ordered:    Medication list reviewed and assessed for continued appropriateness. Monthly medication orders reviewed and signed.  Vikki Ports, NP-C Geriatrics [facility] [phone], [place] [address] Cell Phone (Mon-Fri 8am-5pm):  [phone] On Call:  [phone] & follow prompts after 5pm & weekends Office Phone:  [phone] Office Fax:  [phone]

## 2016-05-25 DIAGNOSIS — M6281 Muscle weakness (generalized): Secondary | ICD-10-CM | POA: Diagnosis not present

## 2016-05-25 DIAGNOSIS — R262 Difficulty in walking, not elsewhere classified: Secondary | ICD-10-CM | POA: Diagnosis not present

## 2016-05-25 DIAGNOSIS — R4189 Other symptoms and signs involving cognitive functions and awareness: Secondary | ICD-10-CM | POA: Diagnosis not present

## 2016-05-26 DIAGNOSIS — R262 Difficulty in walking, not elsewhere classified: Secondary | ICD-10-CM | POA: Diagnosis not present

## 2016-05-26 DIAGNOSIS — M6281 Muscle weakness (generalized): Secondary | ICD-10-CM | POA: Diagnosis not present

## 2016-05-26 DIAGNOSIS — R4189 Other symptoms and signs involving cognitive functions and awareness: Secondary | ICD-10-CM | POA: Diagnosis not present

## 2016-05-28 ENCOUNTER — Encounter
Admission: RE | Admit: 2016-05-28 | Discharge: 2016-05-28 | Disposition: A | Discharge status: Home / Self Care | Payer: Medicare Other | Source: Ambulatory Visit | Attending: Internal Medicine | Admitting: Internal Medicine

## 2016-05-29 DIAGNOSIS — R262 Difficulty in walking, not elsewhere classified: Secondary | ICD-10-CM | POA: Diagnosis not present

## 2016-05-29 DIAGNOSIS — M6281 Muscle weakness (generalized): Secondary | ICD-10-CM | POA: Diagnosis not present

## 2016-05-29 DIAGNOSIS — R4189 Other symptoms and signs involving cognitive functions and awareness: Secondary | ICD-10-CM | POA: Diagnosis not present

## 2016-05-30 DIAGNOSIS — R4189 Other symptoms and signs involving cognitive functions and awareness: Secondary | ICD-10-CM | POA: Diagnosis not present

## 2016-05-30 DIAGNOSIS — R262 Difficulty in walking, not elsewhere classified: Secondary | ICD-10-CM | POA: Diagnosis not present

## 2016-05-30 DIAGNOSIS — M6281 Muscle weakness (generalized): Secondary | ICD-10-CM | POA: Diagnosis not present

## 2016-05-31 DIAGNOSIS — R262 Difficulty in walking, not elsewhere classified: Secondary | ICD-10-CM | POA: Diagnosis not present

## 2016-05-31 DIAGNOSIS — M6281 Muscle weakness (generalized): Secondary | ICD-10-CM | POA: Diagnosis not present

## 2016-05-31 DIAGNOSIS — R4189 Other symptoms and signs involving cognitive functions and awareness: Secondary | ICD-10-CM | POA: Diagnosis not present

## 2016-05-31 IMAGING — RF DG BARIUM SWALLOW
5 series · 11 of 11 positions shown · non-contrast
Comparison: None.

CLINICAL DATA: Difficulty swallowing

EXAM:
ESOPHOGRAM/BARIUM SWALLOW
TECHNIQUE: Single contrast examination was performed using  thin barium.
FLUOROSCOPY TIME:  0 min 42 seconds.

[Series 1: fluoro_barium 2fps_bw · 0.20mm/px · 4 of 9 frames shown (1 of 5)]
[frame 2/9]
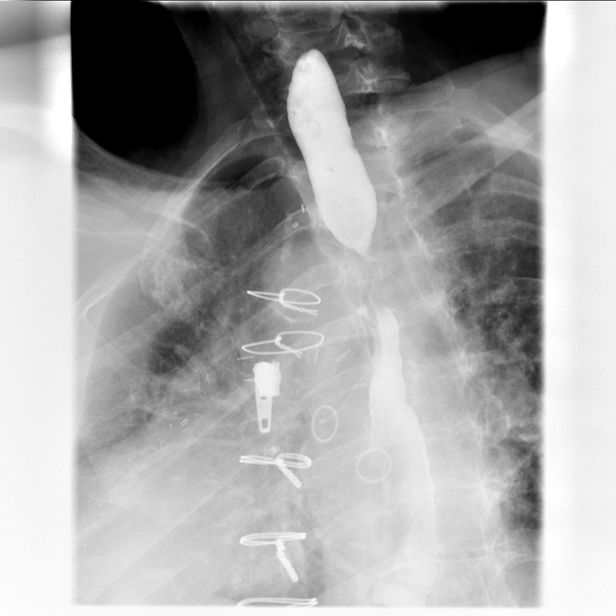
[frame 5/9]
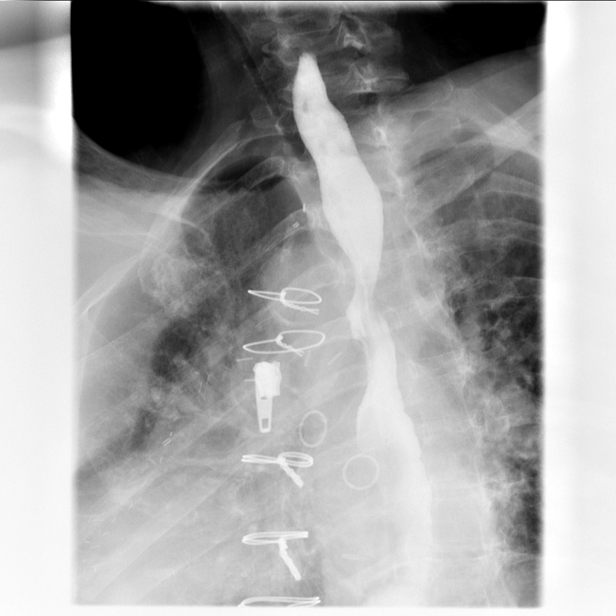
[frame 7/9]
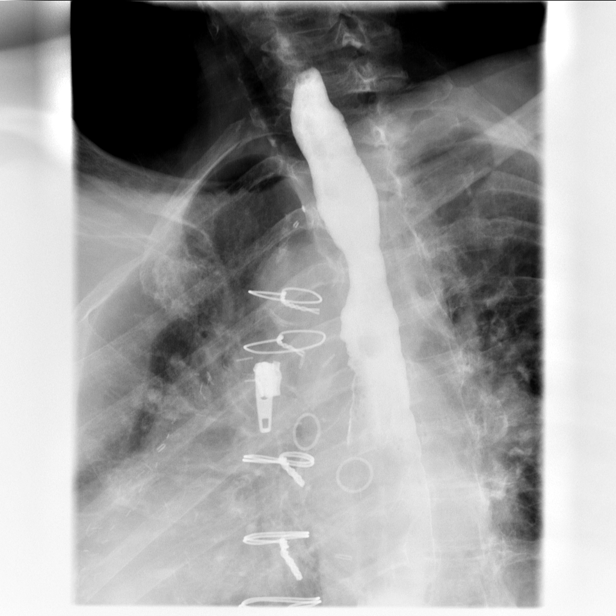
[frame 8/9]
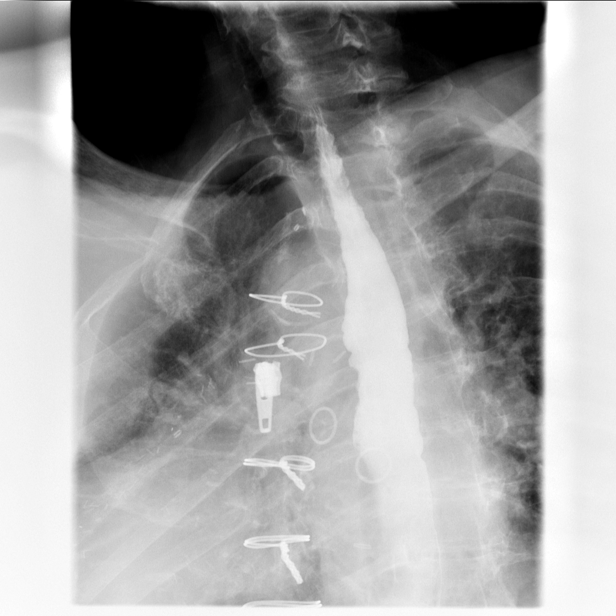

[Series 2: fluoro_barium 2fps_bw · 0.20mm/px · 4 of 12 frames shown (2 of 5)]
[frame 1/12]
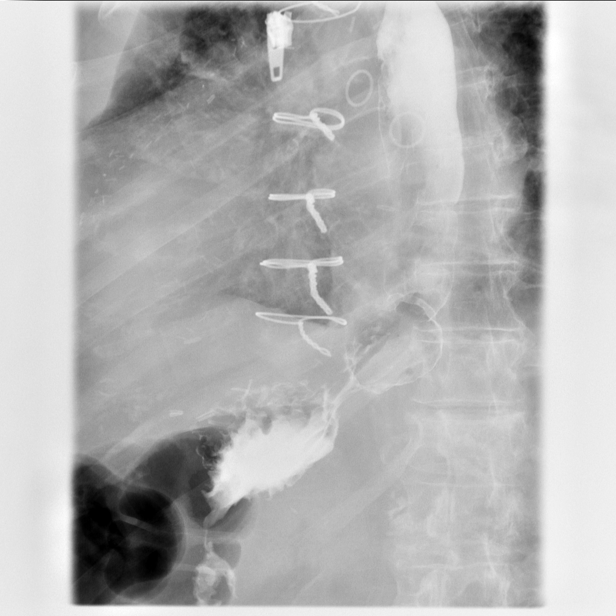
[frame 2/12]
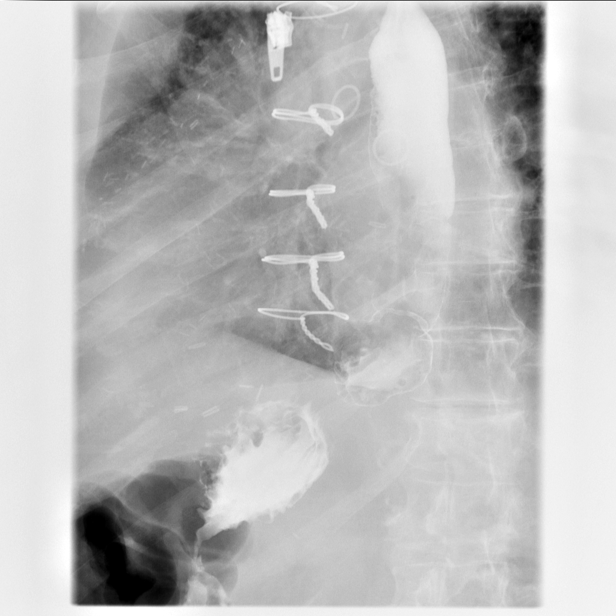
[frame 7/12]
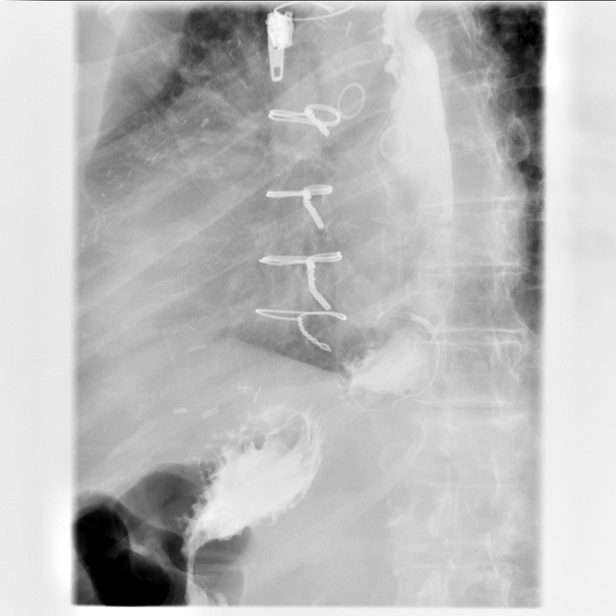
[frame 11/12]
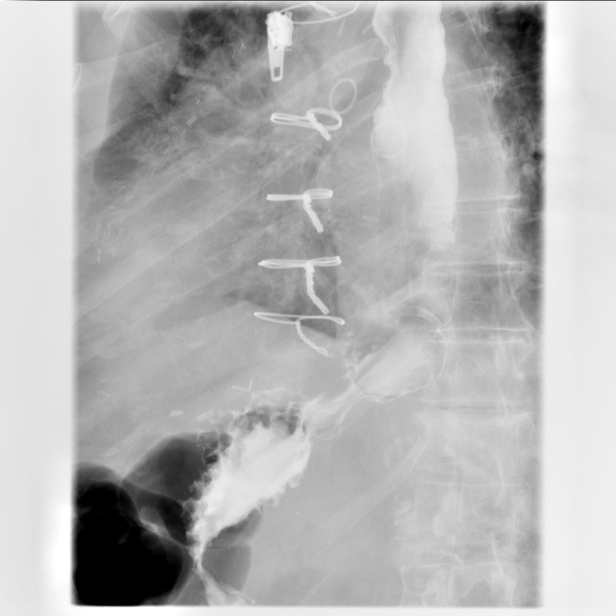

[Series 3: fluoro_barium 2fps_bw · 0.19mm/px · 1 of 1 slices shown (3 of 5)]
[im 1/1]
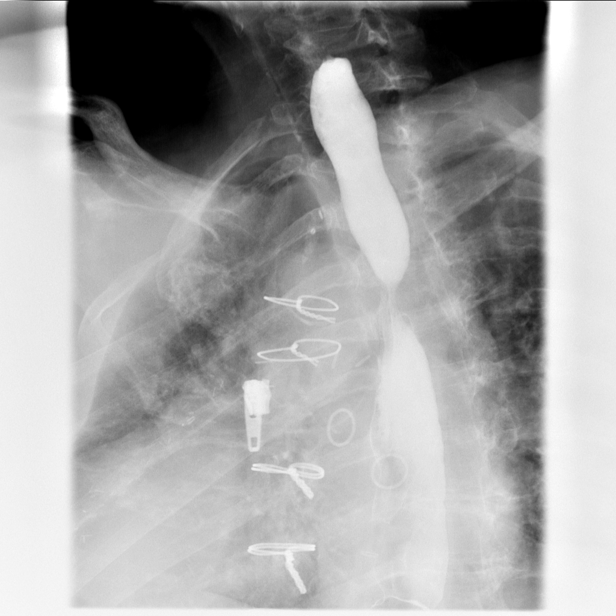

[Series 4: fluoro_barium 2fps_bw · 0.19mm/px · 1 of 1 slices shown (4 of 5)]
[im 1/1]
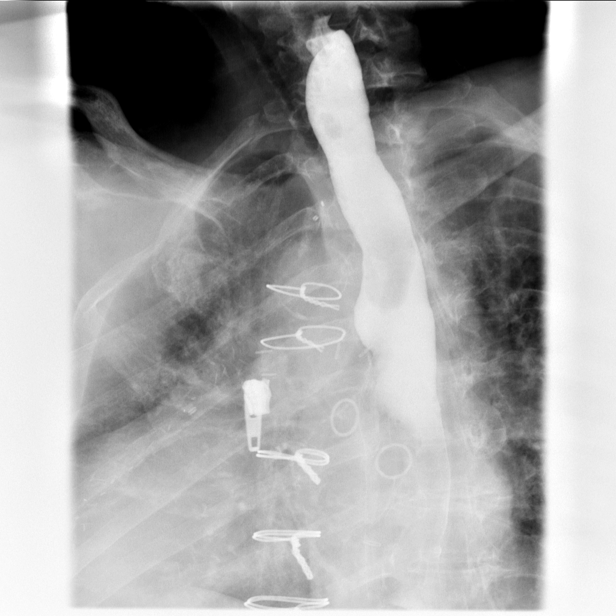

[Series 5: fluoro_barium 2fps_bw · 0.19mm/px · 1 of 1 slices shown (5 of 5)]
[im 1/1]
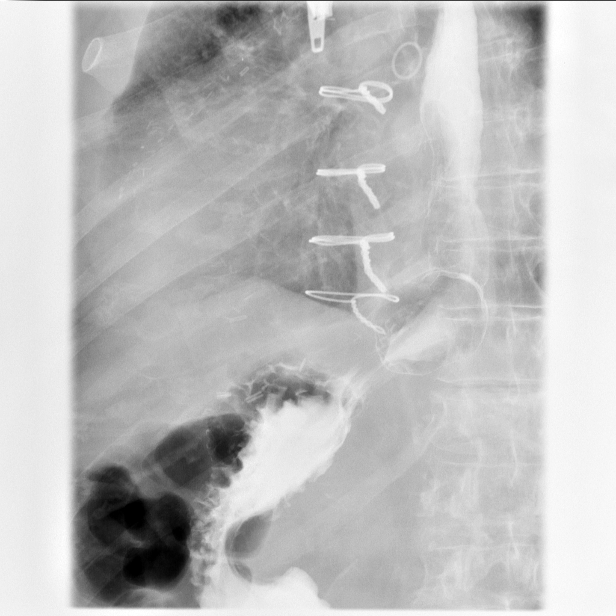

[11 of 11 positions shown; findings below may reference images not displayed]

FINDINGS: A limited barium swallow was performed due to the patient's
condition. No obstructing abnormality is identified. No evidence of
barium leak. Small hiatal hernia.
IMPRESSION: Small hiatal hernia. Otherwise normal exam. Esophagus is widely
patent.

## 2016-06-01 DIAGNOSIS — R4189 Other symptoms and signs involving cognitive functions and awareness: Secondary | ICD-10-CM | POA: Diagnosis not present

## 2016-06-01 DIAGNOSIS — R262 Difficulty in walking, not elsewhere classified: Secondary | ICD-10-CM | POA: Diagnosis not present

## 2016-06-01 DIAGNOSIS — M6281 Muscle weakness (generalized): Secondary | ICD-10-CM | POA: Diagnosis not present

## 2016-06-02 DIAGNOSIS — M6281 Muscle weakness (generalized): Secondary | ICD-10-CM | POA: Diagnosis not present

## 2016-06-02 DIAGNOSIS — R4189 Other symptoms and signs involving cognitive functions and awareness: Secondary | ICD-10-CM | POA: Diagnosis not present

## 2016-06-02 DIAGNOSIS — R262 Difficulty in walking, not elsewhere classified: Secondary | ICD-10-CM | POA: Diagnosis not present

## 2016-06-04 DIAGNOSIS — R4189 Other symptoms and signs involving cognitive functions and awareness: Secondary | ICD-10-CM | POA: Diagnosis not present

## 2016-06-04 DIAGNOSIS — M6281 Muscle weakness (generalized): Secondary | ICD-10-CM | POA: Diagnosis not present

## 2016-06-04 DIAGNOSIS — R262 Difficulty in walking, not elsewhere classified: Secondary | ICD-10-CM | POA: Diagnosis not present

## 2016-06-05 DIAGNOSIS — M6281 Muscle weakness (generalized): Secondary | ICD-10-CM | POA: Diagnosis not present

## 2016-06-05 DIAGNOSIS — R262 Difficulty in walking, not elsewhere classified: Secondary | ICD-10-CM | POA: Diagnosis not present

## 2016-06-05 DIAGNOSIS — R4189 Other symptoms and signs involving cognitive functions and awareness: Secondary | ICD-10-CM | POA: Diagnosis not present

## 2016-06-06 DIAGNOSIS — R262 Difficulty in walking, not elsewhere classified: Secondary | ICD-10-CM | POA: Diagnosis not present

## 2016-06-06 DIAGNOSIS — M6281 Muscle weakness (generalized): Secondary | ICD-10-CM | POA: Diagnosis not present

## 2016-06-06 DIAGNOSIS — R4189 Other symptoms and signs involving cognitive functions and awareness: Secondary | ICD-10-CM | POA: Diagnosis not present

## 2016-06-07 DIAGNOSIS — M6281 Muscle weakness (generalized): Secondary | ICD-10-CM | POA: Diagnosis not present

## 2016-06-07 DIAGNOSIS — R262 Difficulty in walking, not elsewhere classified: Secondary | ICD-10-CM | POA: Diagnosis not present

## 2016-06-07 DIAGNOSIS — R4189 Other symptoms and signs involving cognitive functions and awareness: Secondary | ICD-10-CM | POA: Diagnosis not present

## 2016-06-12 DIAGNOSIS — H353131 Nonexudative age-related macular degeneration, bilateral, early dry stage: Secondary | ICD-10-CM | POA: Diagnosis not present

## 2016-06-13 DIAGNOSIS — M6281 Muscle weakness (generalized): Secondary | ICD-10-CM | POA: Diagnosis not present

## 2016-06-13 DIAGNOSIS — R4189 Other symptoms and signs involving cognitive functions and awareness: Secondary | ICD-10-CM | POA: Diagnosis not present

## 2016-06-13 DIAGNOSIS — R262 Difficulty in walking, not elsewhere classified: Secondary | ICD-10-CM | POA: Diagnosis not present

## 2016-06-14 DIAGNOSIS — R262 Difficulty in walking, not elsewhere classified: Secondary | ICD-10-CM | POA: Diagnosis not present

## 2016-06-14 DIAGNOSIS — R4189 Other symptoms and signs involving cognitive functions and awareness: Secondary | ICD-10-CM | POA: Diagnosis not present

## 2016-06-14 DIAGNOSIS — M6281 Muscle weakness (generalized): Secondary | ICD-10-CM | POA: Diagnosis not present

## 2016-06-27 ENCOUNTER — Encounter
Admission: RE | Admit: 2016-06-27 | Discharge: 2016-06-27 | Disposition: A | Discharge status: Home / Self Care | Payer: Medicare Other | Source: Ambulatory Visit | Attending: Internal Medicine | Admitting: Internal Medicine

## 2016-07-18 DIAGNOSIS — G308 Other Alzheimer's disease: Secondary | ICD-10-CM | POA: Diagnosis not present

## 2016-07-18 DIAGNOSIS — F419 Anxiety disorder, unspecified: Secondary | ICD-10-CM | POA: Diagnosis not present

## 2016-07-18 DIAGNOSIS — F0281 Dementia in other diseases classified elsewhere with behavioral disturbance: Secondary | ICD-10-CM | POA: Diagnosis not present

## 2016-07-18 DIAGNOSIS — F39 Unspecified mood [affective] disorder: Secondary | ICD-10-CM | POA: Diagnosis not present

## 2016-07-19 ENCOUNTER — Non-Acute Institutional Stay (SKILLED_NURSING_FACILITY): Payer: Medicare Other | Admitting: Gerontology

## 2016-07-19 DIAGNOSIS — I4891 Unspecified atrial fibrillation: Secondary | ICD-10-CM | POA: Diagnosis not present

## 2016-07-19 DIAGNOSIS — I252 Old myocardial infarction: Secondary | ICD-10-CM | POA: Diagnosis not present

## 2016-07-19 DIAGNOSIS — E538 Deficiency of other specified B group vitamins: Secondary | ICD-10-CM | POA: Diagnosis not present

## 2016-07-19 DIAGNOSIS — F39 Unspecified mood [affective] disorder: Secondary | ICD-10-CM | POA: Diagnosis not present

## 2016-07-19 DIAGNOSIS — R001 Bradycardia, unspecified: Secondary | ICD-10-CM | POA: Diagnosis not present

## 2016-07-19 DIAGNOSIS — Z955 Presence of coronary angioplasty implant and graft: Secondary | ICD-10-CM | POA: Diagnosis not present

## 2016-07-19 DIAGNOSIS — M1990 Unspecified osteoarthritis, unspecified site: Secondary | ICD-10-CM

## 2016-07-19 DIAGNOSIS — Z951 Presence of aortocoronary bypass graft: Secondary | ICD-10-CM

## 2016-07-19 DIAGNOSIS — E1122 Type 2 diabetes mellitus with diabetic chronic kidney disease: Secondary | ICD-10-CM | POA: Diagnosis not present

## 2016-07-19 DIAGNOSIS — E876 Hypokalemia: Secondary | ICD-10-CM

## 2016-07-19 DIAGNOSIS — F0281 Dementia in other diseases classified elsewhere with behavioral disturbance: Secondary | ICD-10-CM | POA: Diagnosis not present

## 2016-07-19 DIAGNOSIS — F0280 Dementia in other diseases classified elsewhere without behavioral disturbance: Secondary | ICD-10-CM

## 2016-07-19 DIAGNOSIS — I5022 Chronic systolic (congestive) heart failure: Secondary | ICD-10-CM

## 2016-07-19 DIAGNOSIS — G308 Other Alzheimer's disease: Secondary | ICD-10-CM | POA: Diagnosis not present

## 2016-07-19 DIAGNOSIS — F02818 Dementia in other diseases classified elsewhere, unspecified severity, with other behavioral disturbance: Secondary | ICD-10-CM

## 2016-07-19 DIAGNOSIS — F29 Unspecified psychosis not due to a substance or known physiological condition: Secondary | ICD-10-CM

## 2016-07-19 DIAGNOSIS — Z8673 Personal history of transient ischemic attack (TIA), and cerebral infarction without residual deficits: Secondary | ICD-10-CM

## 2016-07-19 DIAGNOSIS — N182 Chronic kidney disease, stage 2 (mild): Secondary | ICD-10-CM

## 2016-07-19 DIAGNOSIS — I110 Hypertensive heart disease with heart failure: Secondary | ICD-10-CM | POA: Diagnosis not present

## 2016-07-19 DIAGNOSIS — G43909 Migraine, unspecified, not intractable, without status migrainosus: Secondary | ICD-10-CM

## 2016-07-19 DIAGNOSIS — E785 Hyperlipidemia, unspecified: Secondary | ICD-10-CM

## 2016-07-24 NOTE — Progress Notes (Signed)
Location:      Place of Service:  SNF (31) Provider:  Toni Arthurs, NP-C  Albina Billet, MD  Patient Care Team: Albina Billet, MD as PCP - General (Internal Medicine)  Extended Emergency Contact Information Primary Emergency Contact: Coble,Dean Address: [address], [place] [address] [place] Phone: [phone] Relation: Son Secondary Emergency Contact: Jolaine Click Address: [address] [place], [place] [address] Home Phone: [phone] Work Phone: [phone] Relation: None  Code Status:  dnr Goals of care: Advanced Directive information Advanced Directives 07/26/2013  Does Patient Have a Medical Advance Directive? Patient does not have advance directive  Pre-existing out of facility DNR order (yellow form or pink MOST form) No     Chief Complaint  Patient presents with  . Medical Management of Chronic Issues    HPI:  Pt is a 83 y.o. female seen today for medical management of chronic diseases. Pt has been mostly stable over the past month. However, she has began to have an increase in angry outbursts toward staff, especially towards the end of the say. She is not easily redirected when this happens. Staff has to call her son at times in order to get her settled down. Pt does not remember the incidents the following day. She is apologetic about the outbursts. She does not become physically violent, but does yell at staff and accuse them of stealing things, etc. Dose of Depakote was recently increased, which seems to have helped. [facility] follows pt. Otherwise, pt has been stable. No recent falls or UTIs. Lipids were noted to be stable on recent labs, crestor dc'ed. Other than this, pt has been stable. Pt, family or staff do not have any further concerns at this time. VSS   Past Medical History:  Diagnosis Date  . Diabetes mellitus, type II   . Hx of transient ischemic attack (TIA)   . Hyperlipidemia   . Hypertension   . Varicose  veins    Past Surgical History:  Procedure Laterality Date  . APPENDECTOMY    . CHOLECYSTECTOMY    . CORONARY ARTERY BYPASS GRAFT N/A 07/27/2013   Procedure: CORONARY ARTERY BYPASS GRAFTING (CABG) POSS. MITRAL REPAIR;  Surgeon: [name], MD;  Location: [facility];  Service: Open Heart Surgery;  Laterality: N/A;  Coronary artery bypass graft times four using left internal mammary artery and right saphenous leg vein using endoscope.  Marland Kitchen KNEE SURGERY      No Known Allergies  Allergies as of 07/19/2016   No Known Allergies     Medication List       Accurate as of 07/19/16 11:59 PM. Always use your most recent med list.          acetaminophen 500 MG tablet Commonly known as:  TYLENOL Take 1,000 mg by mouth every 6 (six) hours as needed for headache.   amiodarone 200 MG tablet Commonly known as:  PACERONE Take 1 tablet (200 mg total) by mouth every 12 (twelve) hours. For 3 days; then on 08/05/2013, take Amiodarone 200 mg by mouth daily thereafter   aspirin 81 MG chewable tablet Chew 1 tablet (81 mg total) by mouth daily.   CRESTOR 40 MG tablet Generic drug:  rosuvastatin Take 40 mg by mouth daily.   furosemide 40 MG tablet Commonly known as:  LASIX Take 1 tablet (40 mg total) by mouth daily. For  5 days then stop.   lisinopril 2.5 MG tablet Commonly known as:  PRINIVIL,ZESTRIL Take 1 tablet (2.5 mg total) by mouth daily.   metFORMIN 500 MG 24 hr tablet Commonly known as:  GLUCOPHAGE-XR Take 1,000 mg by mouth 2 (two) times daily.   metoprolol tartrate 25 MG tablet Commonly known as:  LOPRESSOR Take 1 tablet (25 mg total) by mouth 2 (two) times daily.   potassium chloride SA 20 MEQ tablet Commonly known as:  K-DUR,KLOR-CON Take 1 tablet (20 mEq total) by mouth daily. For 5 days then stop.   traMADol 50 MG tablet Commonly known as:  ULTRAM Take 1 tablet (50 mg total) by mouth every 6 (six) hours as needed for moderate pain.   triamterene-hydrochlorothiazide  37.5-25 MG capsule Commonly known as:  DYAZIDE Take 1 capsule by mouth daily.   warfarin 2.5 MG tablet Commonly known as:  COUMADIN Take 1 tablet (2.5 mg total) by mouth daily at 6 PM. Or as directed.       Review of Systems  Constitutional: Negative for activity change, appetite change, chills, diaphoresis and fever.  Respiratory: Negative for cough, choking, chest tightness, shortness of breath and wheezing.   Cardiovascular: Negative for chest pain, palpitations and leg swelling.  Gastrointestinal: Negative for abdominal distention, abdominal pain, constipation, diarrhea and nausea.  Genitourinary: Negative for difficulty urinating, dysuria, frequency and urgency.  Musculoskeletal: Positive for arthralgias (typical arthritis). Negative for back pain, gait problem and myalgias.  Skin: Negative for color change, pallor and rash.  Neurological: Negative for dizziness, tremors, syncope, speech difficulty, weakness, numbness and headaches.  Psychiatric/Behavioral: Negative for agitation and behavioral problems.  All other systems reviewed and are negative.    There is no immunization history on file for this patient. Pertinent  Health Maintenance Due  Topic Date Due  . FOOT EXAM  10/06/1938  . OPHTHALMOLOGY EXAM  10/06/1938  . DEXA SCAN  10/07/1995  . PNA vac Low Risk Adult (1 of 2 - PCV13) 10/07/1995  . HEMOGLOBIN A1C  01/26/2014  . INFLUENZA VACCINE  09/27/2016   No flowsheet data found. Functional Status Survey:    Vitals:   07/19/16 1940  BP: 138/71  Pulse: 77  Resp: (!) 24  Temp: 98.1 F (36.7 C)  SpO2: 100%  Weight: 243 lb 1.6 oz (110.3 kg)   Body mass index is 38.07 kg/m. Physical Exam  Constitutional: She is oriented to person, place, and time. Vital signs are normal. She appears well-developed and well-nourished. She is active and cooperative. She does not appear ill. No distress.  HENT:  Head: Normocephalic and atraumatic.  Mouth/Throat: Uvula is  midline, oropharynx is clear and moist and mucous membranes are normal. Mucous membranes are not pale, not dry and not cyanotic.  Eyes: Conjunctivae, EOM and lids are normal. Pupils are equal, round, and reactive to light.  Neck: Trachea normal, normal range of motion and full passive range of motion without pain. Neck supple. No JVD present. No tracheal deviation, no edema and no erythema present. No thyromegaly present.  Cardiovascular: Normal rate, regular rhythm, intact distal pulses and normal pulses.  Exam reveals no gallop, no distant heart sounds and no friction rub.   No murmur heard. Pulmonary/Chest: Effort normal and breath sounds normal. No accessory muscle usage. No respiratory distress. She has no wheezes. She has no rales. She exhibits no tenderness.  Abdominal: Normal appearance and bowel sounds are normal. She exhibits no distension and no ascites. There is no tenderness.  Musculoskeletal: Normal  range of motion. She exhibits no edema or tenderness.  Expected osteoarthritis, stiffness  Neurological: She is alert and oriented to person, place, and time. She has normal strength.  Skin: Skin is warm, dry and intact. No rash noted. She is not diaphoretic. No cyanosis or erythema. No pallor. Nails show no clubbing.  See description in the HPI  Psychiatric: She has a normal mood and affect. Her speech is normal and behavior is normal. Judgment and thought content normal. Cognition and memory are normal.  Nursing note and vitals reviewed.   Labs reviewed:  Recent Labs  08/17/15 1208 04/27/16 0640  NA 141 140  K 3.6 3.9  CL 109 109  CO2 23 23  GLUCOSE 106* 108*  BUN 46* 29*  CREATININE 1.99* 1.60*  CALCIUM 9.1 8.7*  MG  --  2.4    Recent Labs  08/17/15 1208 04/27/16 0640  AST 27 24  ALT 21 19  ALKPHOS 65 59  BILITOT 0.5 0.7  PROT 6.5 6.0*  ALBUMIN 3.7 3.2*    Recent Labs  08/17/15 1208 04/27/16 0640  WBC 5.5 6.6  NEUTROABS 3.9 4.2  HGB 10.4* 10.7*  HCT  32.1* 31.6*  MCV 84.4 90.1  PLT 131* 144*   Lab Results  Component Value Date   TSH 2.774 04/27/2016   Lab Results  Component Value Date   HGBA1C 6.0 (H) 07/26/2013   Lab Results  Component Value Date   CHOL 142 04/27/2016   HDL 56 04/27/2016   LDLCALC 70 04/27/2016   TRIG 81 04/27/2016   CHOLHDL 2.5 04/27/2016    Significant Diagnostic Results in last 30 days:  No results found.  Assessment/Plan 1. Other Alzheimer's disease  Variable  Discontinue Namenda XR 14 mg po Q Day  Namenda 5 mg po BID   2. Dementia in other diseases classified elsewhere with behavioral disturbance  Variable  Discontinue Namenda XR 14 mg po Q Day  Namenda 5 mg po BID  3. Unspecified psychosis not due to a substance or known physiological condition  Variable  Continue Depakote sprinkles 250 mg po BID  Continue Cholecalciferol 2,000 units po Q Day  4. Mood disorder (HCC)  Variable  Continue Depakote sprinkles 250 mg po BID  Continue Cholecalciferol 2,000 units po Q Day  5. Type 2 diabetes mellitus with stage 2 chronic kidney disease, without long-term current use of insulin (HCC)  Stable   6. Hypertensive heart disease with heart failure (HCC)  Stable  Continue Lisinopril 2.5 mg po Q Day  Continue ASA 81 mg po Q Day  7. Chronic systolic congestive heart failure (HCC)  Stable  Increase Torsemide 30 mg po Q Day  8. Presence of coronary artery bypass graft stent  Stable   9. Bradycardia  Stable   10. Deficiency of other specified B group vitamins (CODE)  Stable  Continue Cyanocobalamin 1,000 mcg po Q Day  11. Old myocardial infarction  Stable   12. Atrial fibrillation, unspecified type (HCC)  Stable  Continue Eliquis 2.5 mg po BID  13. Hyperlipidemia, unspecified hyperlipidemia type  Stable  D/C Crestor  14. Hypomagnesemia  Stable  Continue Magnesium Oxide 400 mg po Q day  15. Hypokalemia  Stable  Continue Potassium Chloride 20 meq  po Q Day  16. Personal history of transient ischemic attack (TIA), and cerebral infarction without residual deficits  Stable   17. Migraine without status migrainosus, not intractable, unspecified migraine type  Stable  Continue Topamax 50 mg po BID  18. Osteoarthritis  Continue Tylenol 650 mg po TID  Continue Voltaren 1% gel- 4 grams to both knees QID   Family/ staff Communication:   Total Time:  Documentation:  Face to Face:  Family/Phone:   Labs/tests ordered:    Medication list reviewed and assessed for continued appropriateness. Monthly medication orders reviewed and signed.  Vikki Ports, NP-C Geriatrics [facility] [phone], [place] [address] Cell Phone (Mon-Fri 8am-5pm):  [phone] On Call:  [phone] & follow prompts after 5pm & weekends Office Phone:  [phone] Office Fax:  [phone]

## 2016-07-25 DIAGNOSIS — E1122 Type 2 diabetes mellitus with diabetic chronic kidney disease: Secondary | ICD-10-CM | POA: Diagnosis not present

## 2016-07-25 DIAGNOSIS — I482 Chronic atrial fibrillation: Secondary | ICD-10-CM | POA: Diagnosis not present

## 2016-07-25 DIAGNOSIS — I5022 Chronic systolic (congestive) heart failure: Secondary | ICD-10-CM | POA: Diagnosis not present

## 2016-07-25 DIAGNOSIS — N182 Chronic kidney disease, stage 2 (mild): Secondary | ICD-10-CM | POA: Diagnosis not present

## 2016-07-25 DIAGNOSIS — F0390 Unspecified dementia without behavioral disturbance: Secondary | ICD-10-CM | POA: Diagnosis not present

## 2016-07-28 ENCOUNTER — Encounter
Admission: RE | Admit: 2016-07-28 | Discharge: 2016-07-28 | Disposition: A | Discharge status: Home / Self Care | Payer: Medicare Other | Source: Ambulatory Visit | Attending: Internal Medicine | Admitting: Internal Medicine

## 2016-08-08 DIAGNOSIS — F39 Unspecified mood [affective] disorder: Secondary | ICD-10-CM | POA: Diagnosis not present

## 2016-08-08 DIAGNOSIS — F419 Anxiety disorder, unspecified: Secondary | ICD-10-CM | POA: Diagnosis not present

## 2016-08-08 DIAGNOSIS — F0281 Dementia in other diseases classified elsewhere with behavioral disturbance: Secondary | ICD-10-CM | POA: Diagnosis not present

## 2016-08-08 DIAGNOSIS — G308 Other Alzheimer's disease: Secondary | ICD-10-CM | POA: Diagnosis not present

## 2016-08-22 DIAGNOSIS — F0281 Dementia in other diseases classified elsewhere with behavioral disturbance: Secondary | ICD-10-CM | POA: Diagnosis not present

## 2016-08-22 DIAGNOSIS — F39 Unspecified mood [affective] disorder: Secondary | ICD-10-CM | POA: Diagnosis not present

## 2016-08-22 DIAGNOSIS — G308 Other Alzheimer's disease: Secondary | ICD-10-CM | POA: Diagnosis not present

## 2016-08-22 DIAGNOSIS — F419 Anxiety disorder, unspecified: Secondary | ICD-10-CM | POA: Diagnosis not present

## 2016-08-25 ENCOUNTER — Non-Acute Institutional Stay (SKILLED_NURSING_FACILITY): Payer: Medicare Other | Admitting: Gerontology

## 2016-08-25 DIAGNOSIS — I509 Heart failure, unspecified: Secondary | ICD-10-CM

## 2016-08-25 DIAGNOSIS — R600 Localized edema: Secondary | ICD-10-CM | POA: Diagnosis not present

## 2016-08-26 ENCOUNTER — Encounter: Payer: Self-pay | Admitting: Gerontology

## 2016-08-27 ENCOUNTER — Encounter
Admission: RE | Admit: 2016-08-27 | Discharge: 2016-08-27 | Disposition: A | Discharge status: Home / Self Care | Payer: Medicare Other | Source: Ambulatory Visit | Attending: Internal Medicine | Admitting: Internal Medicine

## 2016-08-28 ENCOUNTER — Encounter: Payer: Self-pay | Admitting: Gerontology

## 2016-08-28 DIAGNOSIS — E1122 Type 2 diabetes mellitus with diabetic chronic kidney disease: Secondary | ICD-10-CM | POA: Insufficient documentation

## 2016-08-28 DIAGNOSIS — N182 Chronic kidney disease, stage 2 (mild): Secondary | ICD-10-CM

## 2016-08-28 DIAGNOSIS — I4891 Unspecified atrial fibrillation: Secondary | ICD-10-CM | POA: Insufficient documentation

## 2016-08-28 DIAGNOSIS — I509 Heart failure, unspecified: Secondary | ICD-10-CM | POA: Insufficient documentation

## 2016-08-28 NOTE — Progress Notes (Signed)
Opened in error; Disregard.

## 2016-08-28 NOTE — Progress Notes (Signed)
Location:   [facility] Room Number: [place] of Service:  SNF [phone]) Provider:  Toni Arthurs, NP-C  Albina Billet, MD  Patient Care Team: Albina Billet, MD as PCP - General (Internal Medicine)  Extended Emergency Contact Information Primary Emergency Contact: Coble,Dean Address: [address] [place], [place] [address] [place] Phone: [phone] Relation: Son Secondary Emergency Contact: Jolaine Click Address: [address], [place] [address] Home Phone: [phone] Work Phone: [phone] Relation: None  Code Status:  DNR Goals of care: Advanced Directive information Advanced Directives 08/28/2016  Does Patient Have a Medical Advance Directive? Yes  Type of Advance Directive Out of facility DNR (pink MOST or yellow form)  Does patient want to make changes to medical advance directive? No - Patient declined  Pre-existing out of facility DNR order (yellow form or pink MOST form) -     Chief Complaint  Patient presents with  . Acute Visit    Acute    HPI:  Pt is a 83 y.o. female seen today for an acute visit for BLE edema. Pt c/o redness and itching to the legs. Numerous scabbed r/t to scratching scattered on the legs. No bleeding. Skin dry and irritated appearing. No s/s of infection. Pt denies chest pain or shortness of breath. Pt spends the majority of the day sitting up and mobilizing in the wheelchair with legs in the dependent position. VSS. No other complaints.    Past Medical History:  Diagnosis Date  . A-fib ([facility] [place])   . Chronic CHF (congestive heart failure) ([facility])   . Diabetes mellitus with stage 2 chronic kidney disease ([facility])   . Hx of transient ischemic attack (TIA)   . Hyperlipidemia   . Hypertension   . Varicose veins    Past Surgical History:  Procedure Laterality Date  . APPENDECTOMY    . CHOLECYSTECTOMY    . CORONARY ARTERY BYPASS GRAFT N/A 07/27/2013   Procedure: CORONARY ARTERY BYPASS  GRAFTING (CABG) POSS. MITRAL REPAIR;  Surgeon: [name], MD;  Location: [facility];  Service: Open Heart Surgery;  Laterality: N/A;  Coronary artery bypass graft times four using left internal mammary artery and right saphenous leg vein using endoscope.  Marland Kitchen KNEE SURGERY      No Known Allergies  Allergies as of 08/25/2016   No Known Allergies     Medication List       Accurate as of 08/25/16 11:59 PM. Always use your most recent med list.          acetaminophen 325 MG tablet Commonly known as:  TYLENOL Take 650 mg by mouth every 4 (four) hours as needed for fever. May administer orally, per G-tube if needed or rectally if unable to swallow. Maximum dose for 24 hours is 3000 mg from all sources of Acetaminophen /Tylenol   acetaminophen 325 MG tablet Commonly known as:  TYLENOL Take 650 mg by mouth 3 (three) times daily.   ASPERCREME W/LIDOCAINE 4 % cream Generic drug:  lidocaine Apply 1 application topically 3 (three) times daily. Apply thin layer to bilateral knees   aspirin 81 MG chewable tablet Chew 1 tablet (81 mg total) by mouth daily.   cetirizine 5 MG tablet Commonly known as:  ZYRTEC Take 5 mg by mouth at bedtime.   Cholecalciferol 2000 units Caps Take 1 capsule by mouth daily.  cyanocobalamin 1000 MCG tablet Take 1,000 mcg by mouth daily.   divalproex 125 MG capsule Commonly known as:  DEPAKOTE SPRINKLE Take 375 mg by mouth 2 (two) times daily. 3 capsules   ELIQUIS 2.5 MG Tabs tablet Generic drug:  apixaban Take 2.5 mg by mouth 2 (two) times daily.   LORazepam 0.5 MG tablet Commonly known as:  ATIVAN Take 0.5 mg by mouth every evening. At 5 pm, ok to hold for sedation   magnesium oxide 400 MG tablet Commonly known as:  MAG-OX Take 400 mg by mouth daily.   memantine 5 MG tablet Commonly known as:  NAMENDA Take 5 mg by mouth 2 (two) times daily.   polyethylene glycol packet Commonly known as:  MIRALAX / GLYCOLAX Take 17 g by mouth daily.     potassium chloride SA 20 MEQ tablet Commonly known as:  K-DUR,KLOR-CON Take 20 mEq by mouth daily.   PRESERVISION AREDS Tabs Take 1 tablet by mouth daily.   QUEtiapine 25 MG tablet Commonly known as:  SEROQUEL Take 25 mg by mouth 2 (two) times daily.   SENNA-PLUS 8.6-50 MG tablet Generic drug:  senna-docusate Take 1 tablet by mouth 2 (two) times daily.   topiramate 50 MG tablet Commonly known as:  TOPAMAX Take 50 mg by mouth 2 (two) times daily.   torsemide 20 MG tablet Commonly known as:  DEMADEX Take 30 mg by mouth daily. 1 and 1/2 tablet   UTI-STAT Liqd Take 30 mLs by mouth daily.       Review of Systems  Constitutional: Negative for activity change, appetite change, chills, diaphoresis and fever.  Respiratory: Negative for cough, choking, chest tightness, shortness of breath and wheezing.   Cardiovascular: Positive for leg swelling. Negative for chest pain and palpitations.  Gastrointestinal: Negative for abdominal distention, abdominal pain, constipation, diarrhea and nausea.  Genitourinary: Negative for difficulty urinating, dysuria, frequency and urgency.  Musculoskeletal: Positive for arthralgias (typical arthritis). Negative for back pain, gait problem and myalgias.  Skin: Negative for color change, pallor and rash.  Neurological: Negative for dizziness, tremors, syncope, speech difficulty, weakness, numbness and headaches.  Psychiatric/Behavioral: Negative for agitation and behavioral problems.  All other systems reviewed and are negative.   Immunization History  Administered Date(s) Administered  . Influenza-Unspecified 11/07/2013, 11/17/2014, 11/11/2015  . PPD Test 11/24/2014, 11/27/2015  . Pneumococcal-Unspecified 01/21/2014   Pertinent  Health Maintenance Due  Topic Date Due  . FOOT EXAM  10/06/1938  . OPHTHALMOLOGY EXAM  10/06/1938  . DEXA SCAN  10/07/1995  . PNA vac Low Risk Adult (1 of 2 - PCV13) 10/07/1995  . HEMOGLOBIN A1C  01/26/2014  .  INFLUENZA VACCINE  09/27/2016   No flowsheet data found. Functional Status Survey:    Vitals:   08/25/16 1429  BP: (!) 142/72  Pulse: 84  Resp: 18  Temp: 98.6 F (37 C)  SpO2: 94%  Weight: 238 lb 9.6 oz (108.2 kg)  Height: 5\' 7"  (1.702 m)   Body mass index is 37.37 kg/m. Physical Exam  Constitutional: She is oriented to person, place, and time. Vital signs are normal. She appears well-developed and well-nourished. She is active and cooperative. She does not appear ill. No distress.  HENT:  Head: Normocephalic and atraumatic.  Mouth/Throat: Uvula is midline, oropharynx is clear and moist and mucous membranes are normal. Mucous membranes are not pale, not dry and not cyanotic.  Eyes: Pupils are equal, round, and reactive to light. Conjunctivae, EOM and lids are normal.  Neck: Trachea  normal, normal range of motion and full passive range of motion without pain. Neck supple. No JVD present. No tracheal deviation, no edema and no erythema present. No thyromegaly present.  Cardiovascular: Normal rate, regular rhythm, intact distal pulses and normal pulses.  Exam reveals no gallop, no distant heart sounds and no friction rub.   No murmur heard. Pulses:      Dorsalis pedis pulses are 2+ on the right side, and 2+ on the left side.  BLE edema  Pulmonary/Chest: Effort normal and breath sounds normal. No accessory muscle usage. No respiratory distress. She has no decreased breath sounds. She has no wheezes. She has no rhonchi. She has no rales. She exhibits no tenderness.  Abdominal: Normal appearance and bowel sounds are normal. She exhibits no distension and no ascites. There is no tenderness.  Musculoskeletal: Normal range of motion. She exhibits no edema or tenderness.  Expected osteoarthritis, stiffness  Neurological: She is alert and oriented to person, place, and time. She has normal strength.  Skin: Skin is warm, dry and intact. No rash noted. She is not diaphoretic. There is erythema  (BLE). No cyanosis. No pallor. Nails show no clubbing.  See description in the HPI, beginning cellulitis  Psychiatric: She has a normal mood and affect. Her speech is normal and behavior is normal. Judgment and thought content normal. Cognition and memory are normal.  Nursing note and vitals reviewed.   Labs reviewed:  Recent Labs  04/27/16 0640  NA 140  K 3.9  CL 109  CO2 23  GLUCOSE 108*  BUN 29*  CREATININE 1.60*  CALCIUM 8.7*  MG 2.4    Recent Labs  04/27/16 0640  AST 24  ALT 19  ALKPHOS 59  BILITOT 0.7  PROT 6.0*  ALBUMIN 3.2*    Recent Labs  04/27/16 0640  WBC 6.6  NEUTROABS 4.2  HGB 10.7*  HCT 31.6*  MCV 90.1  PLT 144*   Lab Results  Component Value Date   TSH 2.774 04/27/2016   Lab Results  Component Value Date   HGBA1C 6.0 (H) 07/26/2013   Lab Results  Component Value Date   CHOL 142 04/27/2016   HDL 56 04/27/2016   LDLCALC 70 04/27/2016   TRIG 81 04/27/2016   CHOLHDL 2.5 04/27/2016    Significant Diagnostic Results in last 30 days:  No results found.  Assessment/Plan 1. Chronic congestive heart failure, unspecified heart failure type  Monitor  2. Edema of both legs  Cetirizine 5 mg po Q HS for itchy dermatitis r/t edema  Unna-wraps with zinc to BLE- change twice weekly. Apply after shower on shower days  Family/ staff Communication:   Total Time:  Documentation:  Face to Face:  Family/Phone:   Labs/tests ordered:    Medication list reviewed and assessed for continued appropriateness.  Vikki Ports, NP-C Geriatrics [facility] [phone], [place] [address] Cell Phone (Mon-Fri 8am-5pm):  [phone] On Call:  [phone] & follow prompts after 5pm & weekends Office Phone:  [phone] Office Fax:  [phone]

## 2016-09-09 ENCOUNTER — Other Ambulatory Visit
Admission: RE | Admit: 2016-09-09 | Discharge: 2016-09-09 | Disposition: A | Discharge status: Home / Self Care | Payer: Medicare Other | Source: Skilled Nursing Facility | Attending: Gerontology | Admitting: Gerontology

## 2016-09-09 DIAGNOSIS — R350 Frequency of micturition: Secondary | ICD-10-CM | POA: Insufficient documentation

## 2016-09-09 DIAGNOSIS — R410 Disorientation, unspecified: Secondary | ICD-10-CM | POA: Diagnosis not present

## 2016-09-09 LAB — URINALYSIS, COMPLETE (UACMP) WITH MICROSCOPIC
Bacteria, UA: NONE SEEN
Bilirubin Urine: NEGATIVE
GLUCOSE, UA: NEGATIVE mg/dL
HGB URINE DIPSTICK: NEGATIVE
KETONES UR: NEGATIVE mg/dL
Nitrite: NEGATIVE
PROTEIN: NEGATIVE mg/dL
Specific Gravity, Urine: 1.014 (ref 1.005–1.030)
pH: 6 (ref 5.0–8.0)

## 2016-09-10 LAB — URINE CULTURE

## 2016-09-15 DIAGNOSIS — E1122 Type 2 diabetes mellitus with diabetic chronic kidney disease: Secondary | ICD-10-CM | POA: Diagnosis not present

## 2016-09-15 DIAGNOSIS — N182 Chronic kidney disease, stage 2 (mild): Secondary | ICD-10-CM | POA: Diagnosis not present

## 2016-09-15 DIAGNOSIS — I5022 Chronic systolic (congestive) heart failure: Secondary | ICD-10-CM | POA: Diagnosis not present

## 2016-09-15 DIAGNOSIS — F0390 Unspecified dementia without behavioral disturbance: Secondary | ICD-10-CM | POA: Diagnosis not present

## 2016-09-15 DIAGNOSIS — I482 Chronic atrial fibrillation: Secondary | ICD-10-CM | POA: Diagnosis not present

## 2016-09-18 ENCOUNTER — Encounter: Payer: Self-pay | Admitting: Gerontology

## 2016-09-18 ENCOUNTER — Non-Acute Institutional Stay (SKILLED_NURSING_FACILITY): Payer: Medicare Other | Admitting: Gerontology

## 2016-09-18 DIAGNOSIS — I5022 Chronic systolic (congestive) heart failure: Secondary | ICD-10-CM

## 2016-09-18 DIAGNOSIS — I252 Old myocardial infarction: Secondary | ICD-10-CM | POA: Diagnosis not present

## 2016-09-18 DIAGNOSIS — Z955 Presence of coronary angioplasty implant and graft: Secondary | ICD-10-CM

## 2016-09-18 DIAGNOSIS — Z951 Presence of aortocoronary bypass graft: Secondary | ICD-10-CM

## 2016-09-18 DIAGNOSIS — I110 Hypertensive heart disease with heart failure: Secondary | ICD-10-CM

## 2016-09-18 DIAGNOSIS — F0281 Dementia in other diseases classified elsewhere with behavioral disturbance: Secondary | ICD-10-CM | POA: Diagnosis not present

## 2016-09-18 DIAGNOSIS — N182 Chronic kidney disease, stage 2 (mild): Secondary | ICD-10-CM

## 2016-09-18 DIAGNOSIS — F29 Unspecified psychosis not due to a substance or known physiological condition: Secondary | ICD-10-CM | POA: Diagnosis not present

## 2016-09-18 DIAGNOSIS — F39 Unspecified mood [affective] disorder: Secondary | ICD-10-CM | POA: Diagnosis not present

## 2016-09-18 DIAGNOSIS — M1990 Unspecified osteoarthritis, unspecified site: Secondary | ICD-10-CM

## 2016-09-18 DIAGNOSIS — I4891 Unspecified atrial fibrillation: Secondary | ICD-10-CM | POA: Diagnosis not present

## 2016-09-18 DIAGNOSIS — E876 Hypokalemia: Secondary | ICD-10-CM

## 2016-09-18 DIAGNOSIS — R001 Bradycardia, unspecified: Secondary | ICD-10-CM | POA: Diagnosis not present

## 2016-09-18 DIAGNOSIS — F0280 Dementia in other diseases classified elsewhere without behavioral disturbance: Secondary | ICD-10-CM

## 2016-09-18 DIAGNOSIS — G308 Other Alzheimer's disease: Secondary | ICD-10-CM | POA: Diagnosis not present

## 2016-09-18 DIAGNOSIS — E1122 Type 2 diabetes mellitus with diabetic chronic kidney disease: Secondary | ICD-10-CM

## 2016-09-18 DIAGNOSIS — Z8673 Personal history of transient ischemic attack (TIA), and cerebral infarction without residual deficits: Secondary | ICD-10-CM

## 2016-09-18 DIAGNOSIS — E538 Deficiency of other specified B group vitamins: Secondary | ICD-10-CM

## 2016-09-18 DIAGNOSIS — G43909 Migraine, unspecified, not intractable, without status migrainosus: Secondary | ICD-10-CM

## 2016-09-18 DIAGNOSIS — E785 Hyperlipidemia, unspecified: Secondary | ICD-10-CM

## 2016-09-18 NOTE — Progress Notes (Signed)
Location:   [facility] Room Number: [place] of Service:  SNF [phone]) Provider:  Toni Arthurs, NP-C   Patient Care Team: Albina Billet, MD as PCP - General (Internal Medicine)  Extended Emergency Contact Information Primary Emergency Contact: Coble,Dean Address: [address], [place] [address] [place] Phone: [phone] Relation: Son Secondary Emergency Contact: Jolaine Click Address: [address] [place], [place] [address] Home Phone: [phone] Work Phone: [phone] Relation: None  Code Status:  DNR Goals of care: Advanced Directive information Advanced Directives 09/18/2016  Does Patient Have a Medical Advance Directive? Yes  Type of Advance Directive Out of facility DNR (pink MOST or yellow form)  Does patient want to make changes to medical advance directive? No - Patient declined  Pre-existing out of facility DNR order (yellow form or pink MOST form) -     Chief Complaint  Patient presents with  . Medical Management of Chronic Issues    Routine Visit    HPI:  Pt is a 83 y.o. female seen today for medical management of chronic diseases. Pt has been mostly stable over the past month. However, she has began to have an increase in angry outbursts toward staff, especially towards the end of the say. She is not easily redirected when this happens. Staff has to call her son at times in order to get her settled down. Pt does not remember the incidents the following day. She is apologetic about the outbursts. She does not become physically violent, but does yell at staff and accuse them of stealing things, etc. Dose of Depakote was recently increased, which seems to have helped. [facility] follows pt. Otherwise, pt has been stable. No recent falls or UTIs. Lipids were noted to be stable on recent labs, crestor dc'ed. Other than this, pt has been stable. Pt, family or staff do not have any further concerns at this time.  VSS   Past Medical History:  Diagnosis Date  . A-fib ([place])   . Chronic CHF (congestive heart failure) ([place])   . Diabetes mellitus with stage 2 chronic kidney disease ([place])   . Hx of transient ischemic attack (TIA)   . Hyperlipidemia   . Hypertension   . Varicose veins    Past Surgical History:  Procedure Laterality Date  . APPENDECTOMY    . CHOLECYSTECTOMY    . CORONARY ARTERY BYPASS GRAFT N/A 07/27/2013   Procedure: CORONARY ARTERY BYPASS GRAFTING (CABG) POSS. MITRAL REPAIR;  Surgeon: [name], MD;  Location: [facility];  Service: Open Heart Surgery;  Laterality: N/A;  Coronary artery bypass graft times four using left internal mammary artery and right saphenous leg vein using endoscope.  Marland Kitchen KNEE SURGERY      No Known Allergies  Allergies as of 09/18/2016   No Known Allergies     Medication List       Accurate as of 09/18/16  9:27 AM. Always use your most recent med list.          acetaminophen 325 MG tablet Commonly known as:  TYLENOL Take 650 mg by mouth every 4 (four) hours as needed for fever. May administer orally, per G-tube if needed or rectally if unable to swallow. Maximum dose for 24 hours is 3000 mg from all sources of Acetaminophen /Tylenol   acetaminophen 325 MG tablet Commonly known as:  TYLENOL Take  650 mg by mouth 3 (three) times daily.   ASPERCREME W/LIDOCAINE 4 % cream Generic drug:  lidocaine Apply 1 application topically 3 (three) times daily. Apply thin layer to bilateral knees   aspirin 81 MG chewable tablet Chew 1 tablet (81 mg total) by mouth daily.   cetirizine 5 MG tablet Commonly known as:  ZYRTEC Take 5 mg by mouth at bedtime.   Cholecalciferol 2000 units Caps Take 1 capsule by mouth daily.   cyanocobalamin 1000 MCG tablet Take 1,000 mcg by mouth daily.   divalproex 125 MG capsule Commonly known as:  DEPAKOTE SPRINKLE Take 375 mg by mouth 2 (two) times daily. 3 capsules   ELIQUIS 2.5 MG Tabs tablet Generic drug:   apixaban Take 2.5 mg by mouth 2 (two) times daily.   LORazepam 0.5 MG tablet Commonly known as:  ATIVAN Take 0.5 mg by mouth every evening. At 5 pm, ok to hold for sedation   LORazepam 0.5 MG tablet Commonly known as:  ATIVAN Take 0.5 mg by mouth daily as needed.   magnesium oxide 400 MG tablet Commonly known as:  MAG-OX Take 400 mg by mouth daily.   polyethylene glycol packet Commonly known as:  MIRALAX / GLYCOLAX Take 17 g by mouth daily.   potassium chloride SA 20 MEQ tablet Commonly known as:  K-DUR,KLOR-CON Take 20 mEq by mouth daily.   PRESERVISION AREDS Tabs Take 1 tablet by mouth daily.   QUEtiapine 25 MG tablet Commonly known as:  SEROQUEL Give 1 tablet ( 25 mg) by mouth at 12:00 pm, and 2 tablets (50 mg) at 6 pm   SENNA-PLUS 8.6-50 MG tablet Generic drug:  senna-docusate Take 1 tablet by mouth 2 (two) times daily.   topiramate 50 MG tablet Commonly known as:  TOPAMAX Take 50 mg by mouth 2 (two) times daily.   torsemide 20 MG tablet Commonly known as:  DEMADEX Take 30 mg by mouth daily. 1 and 1/2 tablet   UTI-STAT Liqd Take 30 mLs by mouth daily.       Review of Systems  Constitutional: Negative for activity change, appetite change, chills, diaphoresis and fever.  Respiratory: Negative for cough, choking, chest tightness, shortness of breath and wheezing.   Cardiovascular: Negative for chest pain, palpitations and leg swelling.  Gastrointestinal: Negative for abdominal distention, abdominal pain, constipation, diarrhea and nausea.  Genitourinary: Negative for difficulty urinating, dysuria, frequency and urgency.  Musculoskeletal: Positive for arthralgias (typical arthritis). Negative for back pain, gait problem and myalgias.  Skin: Negative for color change, pallor and rash.  Neurological: Negative for dizziness, tremors, syncope, speech difficulty, weakness, numbness and headaches.  Psychiatric/Behavioral: Positive for agitation, behavioral problems  and confusion.  All other systems reviewed and are negative.   Immunization History  Administered Date(s) Administered  . Influenza-Unspecified 11/07/2013, 11/17/2014, 11/11/2015  . PPD Test 11/24/2014, 11/27/2015  . Pneumococcal-Unspecified 01/21/2014   Pertinent  Health Maintenance Due  Topic Date Due  . FOOT EXAM  10/06/1938  . OPHTHALMOLOGY EXAM  10/06/1938  . URINE MICROALBUMIN  [birth year]06/1938  . DEXA SCAN  10/07/1995  . HEMOGLOBIN A1C  01/26/2014  . PNA vac Low Risk Adult (2 of 2 - PCV13) 01/22/2015  . INFLUENZA VACCINE  09/27/2016   No flowsheet data found. Functional Status Survey:    Vitals:   09/18/16 0905  BP: 125/65  Pulse: 84  Resp: (!) 24  Temp: 98.7 F (37.1 C)  SpO2: 100%  Weight: 238 lb 3.2 oz (108 kg)  Height: 5\' 7"  (  1.702 m)   Body mass index is 37.31 kg/m. Physical Exam  Constitutional: She is oriented to person, place, and time. Vital signs are normal. She appears well-developed and well-nourished. She is active and cooperative. She does not appear ill. No distress.  HENT:  Head: Normocephalic and atraumatic.  Mouth/Throat: Uvula is midline, oropharynx is clear and moist and mucous membranes are normal. Mucous membranes are not pale, not dry and not cyanotic.  Eyes: Pupils are equal, round, and reactive to light. Conjunctivae, EOM and lids are normal.  Neck: Trachea normal, normal range of motion and full passive range of motion without pain. Neck supple. No JVD present. No tracheal deviation, no edema and no erythema present. No thyromegaly present.  Cardiovascular: Normal rate, regular rhythm, intact distal pulses and normal pulses.  Exam reveals no gallop, no distant heart sounds and no friction rub.   No murmur heard. Pulses:      Dorsalis pedis pulses are 2+ on the right side, and 2+ on the left side.  BLE edema  Pulmonary/Chest: Effort normal and breath sounds normal. No accessory muscle usage. No respiratory distress. She has no decreased  breath sounds. She has no wheezes. She has no rhonchi. She has no rales. She exhibits no tenderness.  Abdominal: Normal appearance and bowel sounds are normal. She exhibits no distension and no ascites. There is no tenderness.  Musculoskeletal: Normal range of motion. She exhibits no edema or tenderness.  Expected osteoarthritis, stiffness  Neurological: She is alert and oriented to person, place, and time. She has normal strength.  Skin: Skin is warm, dry and intact. No rash noted. She is not diaphoretic. No cyanosis or erythema. No pallor. Nails show no clubbing.  See description in the HPI  Psychiatric: She has a normal mood and affect. Her speech is normal and behavior is normal. Judgment and thought content normal. Cognition and memory are normal.  Nursing note and vitals reviewed.   Labs reviewed:  Recent Labs  04/27/16 0640  NA 140  K 3.9  CL 109  CO2 23  GLUCOSE 108*  BUN 29*  CREATININE 1.60*  CALCIUM 8.7*  MG 2.4    Recent Labs  04/27/16 0640  AST 24  ALT 19  ALKPHOS 59  BILITOT 0.7  PROT 6.0*  ALBUMIN 3.2*    Recent Labs  04/27/16 0640  WBC 6.6  NEUTROABS 4.2  HGB 10.7*  HCT 31.6*  MCV 90.1  PLT 144*   Lab Results  Component Value Date   TSH 2.774 04/27/2016   Lab Results  Component Value Date   HGBA1C 6.0 (H) 07/26/2013   Lab Results  Component Value Date   CHOL 142 04/27/2016   HDL 56 04/27/2016   LDLCALC 70 04/27/2016   TRIG 81 04/27/2016   CHOLHDL 2.5 04/27/2016    Significant Diagnostic Results in last 30 days:  No results found.  Assessment/Plan 1. Other Alzheimer's disease  Variable  Progressive   2. 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 Unspecified psychosis not due to a substance or known physiological condition  Variable  Continue Depakote sprinkles 375 mg po BID  Continue Cholecalciferol 2,000 units po Q Day  Seroquel 25 mg po Q AM and 50 mg  po Q HS  4. Mood disorder (HCC)  Variable  Continue Depakote sprinkles 375 mg po BID  Continue Cholecalciferol 2,000 units po Q Day  Seroquel 25 mg po Q AM and 50 mg po  Q HS  5. Type 2 diabetes mellitus with stage 2 chronic kidney disease, without long-term current use of insulin (HCC)  Stable   6. Hypertensive heart disease with heart failure (HCC)  Stable  Continue Lisinopril 2.5 mg po Q Day  Continue ASA 81 mg po Q Day  7. Chronic systolic congestive heart failure (HCC)  Stable  Increase Torsemide 30 mg po Q Day  Continue Unna wraps of BLE twice weekly  8. Presence of coronary artery bypass graft stent  Stable   9. Bradycardia  Stable   10. Deficiency of other specified B group vitamins (CODE)  Stable  Continue Cyanocobalamin 1,000 mcg po Q Day  11. Old myocardial infarction  Stable   12. Atrial fibrillation, unspecified type (HCC)  Stable  Continue Eliquis 2.5 mg po BID  13. Hyperlipidemia, unspecified hyperlipidemia type  Stable  14. Hypomagnesemia  Stable  Continue Magnesium Oxide 400 mg po Q day  15. Hypokalemia  Stable  Continue Potassium Chloride 20 meq po Q Day  16. Personal history of transient ischemic attack (TIA), and cerebral infarction without residual deficits  Stable   17. Migraine without status migrainosus, not intractable, unspecified migraine type  Stable  Continue Topamax 50 mg po BID  18. Osteoarthritis  Continue Tylenol 650 mg po TID  Continue Voltaren 1% gel- 4 grams to both knees QID   Family/ staff Communication:   Total Time:  Documentation:  Face to Face:  Family/Phone:   Labs/tests ordered:    Medication list reviewed and assessed for continued appropriateness. Monthly medication orders reviewed and signed.  Vikki Ports, NP-C Geriatrics [facility] [phone], [place] [address] Cell Phone (Mon-Fri 8am-5pm):  [phone] On Call:   [phone] & follow prompts after 5pm & weekends Office Phone:  [phone] Office Fax:  [phone]

## 2016-09-27 ENCOUNTER — Other Ambulatory Visit
Admission: RE | Admit: 2016-09-27 | Discharge: 2016-09-27 | Disposition: A | Discharge status: Home / Self Care | Payer: Medicare Other | Source: Ambulatory Visit | Attending: Gerontology | Admitting: Gerontology

## 2016-09-27 ENCOUNTER — Encounter
Admission: RE | Admit: 2016-09-27 | Discharge: 2016-09-27 | Disposition: A | Discharge status: Home / Self Care | Payer: Medicare Other | Source: Ambulatory Visit | Attending: Internal Medicine | Admitting: Internal Medicine

## 2016-09-27 DIAGNOSIS — R4689 Other symptoms and signs involving appearance and behavior: Secondary | ICD-10-CM | POA: Insufficient documentation

## 2016-09-27 DIAGNOSIS — R4589 Other symptoms and signs involving emotional state: Secondary | ICD-10-CM | POA: Insufficient documentation

## 2016-09-27 LAB — URINALYSIS, COMPLETE (UACMP) WITH MICROSCOPIC
Bacteria, UA: NONE SEEN
Bilirubin Urine: NEGATIVE
Glucose, UA: NEGATIVE mg/dL
Hgb urine dipstick: NEGATIVE
KETONES UR: NEGATIVE mg/dL
Nitrite: POSITIVE — AB
PH: 5 (ref 5.0–8.0)
Protein, ur: NEGATIVE mg/dL
Specific Gravity, Urine: 1.012 (ref 1.005–1.030)

## 2016-09-29 LAB — URINE CULTURE: Culture: >=100000 — AB

## 2016-10-03 DIAGNOSIS — G308 Other Alzheimer's disease: Secondary | ICD-10-CM | POA: Diagnosis not present

## 2016-10-03 DIAGNOSIS — F0281 Dementia in other diseases classified elsewhere with behavioral disturbance: Secondary | ICD-10-CM | POA: Diagnosis not present

## 2016-10-03 DIAGNOSIS — F419 Anxiety disorder, unspecified: Secondary | ICD-10-CM | POA: Diagnosis not present

## 2016-10-03 DIAGNOSIS — F39 Unspecified mood [affective] disorder: Secondary | ICD-10-CM | POA: Diagnosis not present

## 2016-10-10 ENCOUNTER — Other Ambulatory Visit
Admission: RE | Admit: 2016-10-10 | Discharge: 2016-10-10 | Disposition: A | Discharge status: Home / Self Care | Payer: Medicare Other | Source: Ambulatory Visit | Attending: Internal Medicine | Admitting: Internal Medicine

## 2016-10-10 DIAGNOSIS — R4689 Other symptoms and signs involving appearance and behavior: Secondary | ICD-10-CM | POA: Diagnosis not present

## 2016-10-10 LAB — URINALYSIS, COMPLETE (UACMP) WITH MICROSCOPIC
BACTERIA UA: NONE SEEN
Bilirubin Urine: NEGATIVE
GLUCOSE, UA: NEGATIVE mg/dL
HGB URINE DIPSTICK: NEGATIVE
Ketones, ur: NEGATIVE mg/dL
NITRITE: NEGATIVE
Protein, ur: 30 mg/dL — AB
SPECIFIC GRAVITY, URINE: 1.014 (ref 1.005–1.030)
pH: 6 (ref 5.0–8.0)

## 2016-10-12 LAB — URINE CULTURE: Culture: >=100000 — AB

## 2016-10-23 DIAGNOSIS — W19XXXA Unspecified fall, initial encounter: Secondary | ICD-10-CM | POA: Diagnosis not present

## 2016-10-23 DIAGNOSIS — M79651 Pain in right thigh: Secondary | ICD-10-CM | POA: Diagnosis not present

## 2016-10-23 DIAGNOSIS — M25551 Pain in right hip: Secondary | ICD-10-CM | POA: Diagnosis not present

## 2016-10-24 ENCOUNTER — Encounter: Payer: Self-pay | Admitting: Gerontology

## 2016-10-24 ENCOUNTER — Non-Acute Institutional Stay (SKILLED_NURSING_FACILITY): Payer: Medicare Other | Admitting: Gerontology

## 2016-10-24 DIAGNOSIS — Z8673 Personal history of transient ischemic attack (TIA), and cerebral infarction without residual deficits: Secondary | ICD-10-CM

## 2016-10-24 DIAGNOSIS — I110 Hypertensive heart disease with heart failure: Secondary | ICD-10-CM

## 2016-10-24 DIAGNOSIS — Z955 Presence of coronary angioplasty implant and graft: Secondary | ICD-10-CM

## 2016-10-24 DIAGNOSIS — G43909 Migraine, unspecified, not intractable, without status migrainosus: Secondary | ICD-10-CM

## 2016-10-24 DIAGNOSIS — Z951 Presence of aortocoronary bypass graft: Secondary | ICD-10-CM

## 2016-10-24 DIAGNOSIS — I5022 Chronic systolic (congestive) heart failure: Secondary | ICD-10-CM

## 2016-10-24 DIAGNOSIS — F39 Unspecified mood [affective] disorder: Secondary | ICD-10-CM

## 2016-10-24 DIAGNOSIS — F0281 Dementia in other diseases classified elsewhere with behavioral disturbance: Secondary | ICD-10-CM | POA: Diagnosis not present

## 2016-10-24 DIAGNOSIS — F0280 Dementia in other diseases classified elsewhere without behavioral disturbance: Secondary | ICD-10-CM

## 2016-10-24 DIAGNOSIS — F29 Unspecified psychosis not due to a substance or known physiological condition: Secondary | ICD-10-CM | POA: Diagnosis not present

## 2016-10-24 DIAGNOSIS — G308 Other Alzheimer's disease: Secondary | ICD-10-CM

## 2016-10-24 DIAGNOSIS — E1122 Type 2 diabetes mellitus with diabetic chronic kidney disease: Secondary | ICD-10-CM | POA: Diagnosis not present

## 2016-10-24 DIAGNOSIS — I252 Old myocardial infarction: Secondary | ICD-10-CM

## 2016-10-24 DIAGNOSIS — F419 Anxiety disorder, unspecified: Secondary | ICD-10-CM | POA: Diagnosis not present

## 2016-10-24 DIAGNOSIS — N182 Chronic kidney disease, stage 2 (mild): Secondary | ICD-10-CM

## 2016-10-24 DIAGNOSIS — M1990 Unspecified osteoarthritis, unspecified site: Secondary | ICD-10-CM

## 2016-10-24 DIAGNOSIS — E538 Deficiency of other specified B group vitamins: Secondary | ICD-10-CM

## 2016-10-24 DIAGNOSIS — I4891 Unspecified atrial fibrillation: Secondary | ICD-10-CM

## 2016-10-24 DIAGNOSIS — E876 Hypokalemia: Secondary | ICD-10-CM

## 2016-10-24 DIAGNOSIS — R001 Bradycardia, unspecified: Secondary | ICD-10-CM | POA: Diagnosis not present

## 2016-10-24 DIAGNOSIS — E785 Hyperlipidemia, unspecified: Secondary | ICD-10-CM

## 2016-10-24 NOTE — Progress Notes (Signed)
Location:   [facility] Room Number: [place] of Service:  SNF [phone]) Provider:  Toni Arthurs, NP-C   Patient Care Team: Albina Billet, MD as PCP - General (Internal Medicine)  Extended Emergency Contact Information Primary Emergency Contact: Coble,Dean Address: [address], [place] [address] [place] Phone: [phone] Relation: Son Secondary Emergency Contact: Jolaine Click Address: [place], [place] [address] Home Phone: [phone] Work Phone: [phone] Relation: None  Code Status:  DNR Goals of care: Advanced Directive information Advanced Directives 10/24/2016  Does Patient Have a Medical Advance Directive? Yes  Type of Advance Directive Out of facility DNR (pink MOST or yellow form)  Does patient want to make changes to medical advance directive? No - Patient declined  Pre-existing out of facility DNR order (yellow form or pink MOST form) -     Chief Complaint  Patient presents with  . Medical Management of Chronic Issues    Routine Visit    HPI:  Pt is a 83 y.o. female seen today for medical management of chronic diseases. Pt has been increasingly unstable over the past month. She has began to have an increase in angry outbursts toward staff, especially towards the end of the day. She is not easily redirected when this happens. Staff has to call her son at times in order to get her settled down. Pt does not remember the incidents the following day. She is apologetic about the outbursts. She does not become physically violent, but does yell at staff and accuse them of stealing things, etc. Having increased confusion, forgetfulness and has had multiple falls this month. Staff reports times of having to feed pt her meals. She also fell the other day and was incontinent of stool. Pt was confused and just laid in the stool. Dose of Depakote was recently increased, which seems to have helped. [facility]  follows pt. Medication adjustments made. Transition from Seroquel to Risperidone. Will update Palliative Care to re-assess pt for possible Hospice admission. VSS   Past Medical History:  Diagnosis Date  . A-fib ([place])   . Chronic CHF (congestive heart failure) ([place])   . Diabetes mellitus with stage 2 chronic kidney disease ([place])   . Hx of transient ischemic attack (TIA)   . Hyperlipidemia   . Hypertension   . Varicose veins    Past Surgical History:  Procedure Laterality Date  . APPENDECTOMY    . CHOLECYSTECTOMY    . CORONARY ARTERY BYPASS GRAFT N/A 07/27/2013   Procedure: CORONARY ARTERY BYPASS GRAFTING (CABG) POSS. MITRAL REPAIR;  Surgeon: [name], MD;  Location: [facility];  Service: Open Heart Surgery;  Laterality: N/A;  Coronary artery bypass graft times four using left internal mammary artery and right saphenous leg vein using endoscope.  Marland Kitchen KNEE SURGERY      No Known Allergies  Allergies as of 10/24/2016   No Known Allergies     Medication List       Accurate as of 10/24/16 10:54 AM. Always use your most recent med list.          acetaminophen 325 MG tablet Commonly known as:  TYLENOL Take 650 mg by mouth every 4 (four) hours as needed for fever. May administer orally, per G-tube if needed or rectally if unable to swallow. Maximum dose for 24 hours is 3000 mg from  all sources of Acetaminophen /Tylenol   acetaminophen 325 MG tablet Commonly known as:  TYLENOL Take 650 mg by mouth 3 (three) times daily.   ASPERCREME W/LIDOCAINE 4 % cream Generic drug:  lidocaine Apply 1 application topically 3 (three) times daily. Apply thin layer to bilateral knees   aspirin 81 MG chewable tablet Chew 1 tablet (81 mg total) by mouth daily.   cetirizine 5 MG tablet Commonly known as:  ZYRTEC Take 5 mg by mouth at bedtime.   Cholecalciferol 2000 units Caps Take 1 capsule by mouth daily.   cyanocobalamin 1000 MCG tablet Take 1,000 mcg by mouth daily.   divalproex 500  MG DR tablet Commonly known as:  DEPAKOTE Take 500 mg by mouth 2 (two) times daily.   ELIQUIS 2.5 MG Tabs tablet Generic drug:  apixaban Take 2.5 mg by mouth 2 (two) times daily.   LORazepam 0.5 MG tablet Commonly known as:  ATIVAN Take 0.5 mg by mouth every evening. At 5 pm, ok to hold for sedation   LORazepam 0.5 MG tablet Commonly known as:  ATIVAN Take 0.5 mg by mouth daily as needed.   magnesium oxide 400 MG tablet Commonly known as:  MAG-OX Take 400 mg by mouth daily.   polyethylene glycol packet Commonly known as:  MIRALAX / GLYCOLAX Take 17 g by mouth daily.   potassium chloride SA 20 MEQ tablet Commonly known as:  K-DUR,KLOR-CON Take 20 mEq by mouth daily.   PRESERVISION AREDS Tabs Take 1 tablet by mouth daily.   QUEtiapine 50 MG tablet Commonly known as:  SEROQUEL Take 50 mg by mouth 2 (two) times daily. 12 pm and 6 pm   risperiDONE 0.5 MG tablet Commonly known as:  RISPERDAL Take 0.5 mg by mouth at bedtime.   SENNA-PLUS 8.6-50 MG tablet Generic drug:  senna-docusate Take 1 tablet by mouth 2 (two) times daily.   topiramate 50 MG tablet Commonly known as:  TOPAMAX Take 50 mg by mouth 2 (two) times daily.   torsemide 20 MG tablet Commonly known as:  DEMADEX Take 30 mg by mouth daily. 1 and 1/2 tablet   UTI-STAT Liqd Take 30 mLs by mouth daily.       Review of Systems  Constitutional: Negative for activity change, appetite change, chills, diaphoresis and fever.  Respiratory: Negative for cough, choking, chest tightness, shortness of breath and wheezing.   Cardiovascular: Negative for chest pain, palpitations and leg swelling.  Gastrointestinal: Negative for abdominal distention, abdominal pain, constipation, diarrhea and nausea.  Genitourinary: Negative for difficulty urinating, dysuria, frequency and urgency.  Musculoskeletal: Positive for arthralgias (typical arthritis). Negative for back pain, gait problem and myalgias.  Skin: Negative for  color change, pallor and rash.  Neurological: Negative for dizziness, tremors, syncope, speech difficulty, weakness, numbness and headaches.  Psychiatric/Behavioral: Positive for agitation, behavioral problems and confusion.  All other systems reviewed and are negative.   Immunization History  Administered Date(s) Administered  . Influenza-Unspecified 11/07/2013, 11/17/2014, 11/11/2015  . PPD Test 11/24/2014, 11/27/2015  . Pneumococcal-Unspecified 01/21/2014   Pertinent  Health Maintenance Due  Topic Date Due  . FOOT EXAM  10/06/1938  . OPHTHALMOLOGY EXAM  10/06/1938  . URINE MICROALBUMIN  10/06/1938  . DEXA SCAN  10/07/1995  . HEMOGLOBIN A1C  01/26/2014  . PNA vac Low Risk Adult (2 of 2 - PCV13) 01/22/2015  . INFLUENZA VACCINE  09/27/2016   No flowsheet data found. Functional Status Survey:    Vitals:   10/24/16 1030  BP: 105/64  Pulse: 100  Resp: 16  Temp: 97.7 F (36.5 C)  SpO2: 100%  Weight: 224 lb 14.4 oz (102 kg)  Height: 5' 7"  (1.702 m)   Body mass index is 35.22 kg/m. Physical Exam  Constitutional: She is oriented to person, place, and time. Vital signs are normal. She appears well-developed and well-nourished. She is active and cooperative. She does not appear ill. No distress.  HENT:  Head: Normocephalic and atraumatic.  Mouth/Throat: Uvula is midline, oropharynx is clear and moist and mucous membranes are normal. Mucous membranes are not pale, not dry and not cyanotic.  Eyes: Pupils are equal, round, and reactive to light. Conjunctivae, EOM and lids are normal.  Neck: Trachea normal, normal range of motion and full passive range of motion without pain. Neck supple. No JVD present. No tracheal deviation, no edema and no erythema present. No thyromegaly present.  Cardiovascular: Normal rate, regular rhythm, intact distal pulses and normal pulses.  Exam reveals no gallop, no distant heart sounds and no friction rub.   No murmur heard. Pulses:      Dorsalis  pedis pulses are 2+ on the right side, and 2+ on the left side.  BLE edema  Pulmonary/Chest: Effort normal and breath sounds normal. No accessory muscle usage. No respiratory distress. She has no decreased breath sounds. She has no wheezes. She has no rhonchi. She has no rales. She exhibits no tenderness.  Abdominal: Normal appearance and bowel sounds are normal. She exhibits no distension and no ascites. There is no tenderness.  Musculoskeletal: Normal range of motion. She exhibits no edema or tenderness.  Expected osteoarthritis, stiffness  Neurological: She is alert and oriented to person, place, and time. She has normal strength.  Skin: Skin is warm, dry and intact. No rash noted. She is not diaphoretic. No cyanosis or erythema. No pallor. Nails show no clubbing.  See description in the HPI  Psychiatric: Her speech is normal. Her affect is labile. She is agitated. Thought content is paranoid and delusional. Cognition and memory are impaired. She expresses impulsivity and inappropriate judgment. She exhibits abnormal recent memory and abnormal remote memory.  Nursing note and vitals reviewed.   Labs reviewed:  Recent Labs  04/27/16 0640  NA 140  K 3.9  CL 109  CO2 23  GLUCOSE 108*  BUN 29*  CREATININE 1.60*  CALCIUM 8.7*  MG 2.4    Recent Labs  04/27/16 0640  AST 24  ALT 19  ALKPHOS 59  BILITOT 0.7  PROT 6.0*  ALBUMIN 3.2*    Recent Labs  04/27/16 0640  WBC 6.6  NEUTROABS 4.2  HGB 10.7*  HCT 31.6*  MCV 90.1  PLT 144*   Lab Results  Component Value Date   TSH 2.774 04/27/2016   Lab Results  Component Value Date   HGBA1C 6.0 (H) 07/26/2013   Lab Results  Component Value Date   CHOL 142 04/27/2016   HDL 56 04/27/2016   LDLCALC 70 04/27/2016   TRIG 81 04/27/2016   CHOLHDL 2.5 04/27/2016    Significant Diagnostic Results in last 30 days:  No results found.  Assessment/Plan 1. Other Alzheimer's disease  Variable  Progressive   2. Dementia in  other diseases classified elsewhere with behavioral disturbance  Variable  Progressive  3. Unspecified psychosis not due to a substance or known physiological condition  Variable  Continue Depakote sprinkles 375 mg po BID  Continue Cholecalciferol 2,000 units po Q Day  Discontinue Seroquel 25 mg po Q AM and  50 mg po Q HS  Risperdol 0.5 mg po BID  4. Mood disorder (HCC)  Variable  Continue Depakote sprinkles 375 mg po BID  Continue Cholecalciferol 2,000 units po Q Day  Discontinue Seroquel 25 mg po Q AM and 50 mg po Q HS  Risperdol 0.5 mg po BID  5. Type 2 diabetes mellitus with stage 2 chronic kidney disease, without long-term current use of insulin (HCC)  Stable   6. Hypertensive heart disease with heart failure (HCC)  Stable  Continue Lisinopril 2.5 mg po Q Day  Continue ASA 81 mg po Q Day  7. Chronic systolic congestive heart failure (HCC)  Stable  Increase Torsemide 30 mg po Q Day  8. Presence of coronary artery bypass graft stent  Stable   9. Bradycardia  Stable   10. Deficiency of other specified B group vitamins (CODE)  Stable  Continue Cyanocobalamin 1,000 mcg po Q Day  11. Old myocardial infarction  Stable   12. Atrial fibrillation, unspecified type (HCC)  Stable  Continue Eliquis 2.5 mg po BID  13. Hyperlipidemia, unspecified hyperlipidemia type  Stable  14. Hypomagnesemia  Stable  Continue Magnesium Oxide 400 mg po Q day  15. Hypokalemia  Stable  Continue Potassium Chloride 20 meq po Q Day  16. Personal history of transient ischemic attack (TIA), and cerebral infarction without residual deficits  Stable   17. Migraine without status migrainosus, not intractable, unspecified migraine type  Stable  Continue Topamax 50 mg po BID  18. Osteoarthritis  Continue Tylenol 650 mg po TID  Continue Voltaren 1% gel- 4 grams to both knees QID   Family/ staff Communication:   Total Time:  Documentation:  Face  to Face:  Family/Phone:   Labs/tests ordered:  Cbc, met c  Medication list reviewed and assessed for continued appropriateness. Monthly medication orders reviewed and signed.  Vikki Ports, NP-C Geriatrics [facility] [phone], [place] [address] Cell Phone (Mon-Fri 8am-5pm):  [phone] On Call:  [phone] & follow prompts after 5pm & weekends Office Phone:  [phone] Office Fax:  [phone]

## 2016-10-26 ENCOUNTER — Other Ambulatory Visit: Payer: Self-pay

## 2016-10-26 MED ORDER — LORAZEPAM 0.5 MG PO TABS: 0.5000 mg | ORAL_TABLET | Freq: Every evening | ORAL | 1 refills | Status: DC

## 2016-10-26 MED ORDER — LORAZEPAM 0.5 MG PO TABS: 0.5000 mg | ORAL_TABLET | Freq: Every day | ORAL | 1 refills | Status: DC | PRN

## 2016-10-26 NOTE — Telephone Encounter (Signed)
Rx sent to [facility] phone : [phone] , fax : [phone]  

## 2016-10-28 ENCOUNTER — Encounter
Admission: RE | Admit: 2016-10-28 | Discharge: 2016-10-28 | Disposition: A | Discharge status: Home / Self Care | Payer: Medicare Other | Source: Ambulatory Visit | Attending: Internal Medicine | Admitting: Internal Medicine

## 2016-11-01 ENCOUNTER — Other Ambulatory Visit
Admission: RE | Admit: 2016-11-01 | Discharge: 2016-11-01 | Disposition: A | Discharge status: Home / Self Care | Payer: Medicare Other | Source: Ambulatory Visit | Attending: Gerontology | Admitting: Gerontology

## 2016-11-01 DIAGNOSIS — R279 Unspecified lack of coordination: Secondary | ICD-10-CM | POA: Diagnosis not present

## 2016-11-01 DIAGNOSIS — R269 Unspecified abnormalities of gait and mobility: Secondary | ICD-10-CM | POA: Diagnosis not present

## 2016-11-01 DIAGNOSIS — I5022 Chronic systolic (congestive) heart failure: Secondary | ICD-10-CM | POA: Diagnosis not present

## 2016-11-01 DIAGNOSIS — R41841 Cognitive communication deficit: Secondary | ICD-10-CM | POA: Diagnosis not present

## 2016-11-01 DIAGNOSIS — I110 Hypertensive heart disease with heart failure: Secondary | ICD-10-CM | POA: Diagnosis not present

## 2016-11-01 DIAGNOSIS — F29 Unspecified psychosis not due to a substance or known physiological condition: Secondary | ICD-10-CM | POA: Insufficient documentation

## 2016-11-01 DIAGNOSIS — M6281 Muscle weakness (generalized): Secondary | ICD-10-CM | POA: Diagnosis not present

## 2016-11-01 DIAGNOSIS — G308 Other Alzheimer's disease: Secondary | ICD-10-CM | POA: Diagnosis not present

## 2016-11-01 DIAGNOSIS — F39 Unspecified mood [affective] disorder: Secondary | ICD-10-CM | POA: Diagnosis not present

## 2016-11-01 DIAGNOSIS — R293 Abnormal posture: Secondary | ICD-10-CM | POA: Diagnosis not present

## 2016-11-01 LAB — COMPREHENSIVE METABOLIC PANEL
ALBUMIN: 3.6 g/dL (ref 3.5–5.0)
ALT: 16 U/L (ref 14–54)
ANION GAP: 13 (ref 5–15)
AST: 21 U/L (ref 15–41)
Alkaline Phosphatase: 82 U/L (ref 38–126)
BUN: 45 mg/dL — AB (ref 6–20)
CHLORIDE: 109 mmol/L (ref 101–111)
CO2: 25 mmol/L (ref 22–32)
Calcium: 9.3 mg/dL (ref 8.9–10.3)
Creatinine, Ser: 1.86 mg/dL — ABNORMAL HIGH (ref 0.44–1.00)
GFR calc Af Amer: 27 mL/min — ABNORMAL LOW (ref >60)
GFR calc non Af Amer: 23 mL/min — ABNORMAL LOW (ref >60)
GLUCOSE: 127 mg/dL — AB (ref 65–99)
POTASSIUM: 3.2 mmol/L — AB (ref 3.5–5.1)
Sodium: 147 mmol/L — ABNORMAL HIGH (ref 135–145)
Total Bilirubin: 0.8 mg/dL (ref 0.3–1.2)
Total Protein: 6.9 g/dL (ref 6.5–8.1)

## 2016-11-01 LAB — CBC WITH DIFFERENTIAL/PLATELET
BASOS ABS: 0 10*3/uL (ref 0–0.1)
BASOS PCT: 0 %
EOS ABS: 0.2 10*3/uL (ref 0–0.7)
EOS PCT: 5 %
HCT: 42.4 % (ref 35.0–47.0)
Hemoglobin: 13.9 g/dL (ref 12.0–16.0)
Lymphocytes Relative: 28 %
Lymphs Abs: 1.5 10*3/uL (ref 1.0–3.6)
MCH: 32 pg (ref 26.0–34.0)
MCHC: 32.8 g/dL (ref 32.0–36.0)
MCV: 97.4 fL (ref 80.0–100.0)
MONO ABS: 0.7 10*3/uL (ref 0.2–0.9)
Monocytes Relative: 13 %
Neutro Abs: 2.9 10*3/uL (ref 1.4–6.5)
Neutrophils Relative %: 54 %
PLATELETS: 147 10*3/uL — AB (ref 150–440)
RBC: 4.35 MIL/uL (ref 3.80–5.20)
RDW: 16 % — AB (ref 11.5–14.5)
WBC: 5.3 10*3/uL (ref 3.6–11.0)

## 2016-11-02 DIAGNOSIS — I110 Hypertensive heart disease with heart failure: Secondary | ICD-10-CM | POA: Diagnosis not present

## 2016-11-02 DIAGNOSIS — R41841 Cognitive communication deficit: Secondary | ICD-10-CM | POA: Diagnosis not present

## 2016-11-02 DIAGNOSIS — F39 Unspecified mood [affective] disorder: Secondary | ICD-10-CM | POA: Diagnosis not present

## 2016-11-02 DIAGNOSIS — G308 Other Alzheimer's disease: Secondary | ICD-10-CM | POA: Diagnosis not present

## 2016-11-02 DIAGNOSIS — F29 Unspecified psychosis not due to a substance or known physiological condition: Secondary | ICD-10-CM | POA: Diagnosis not present

## 2016-11-02 DIAGNOSIS — I5022 Chronic systolic (congestive) heart failure: Secondary | ICD-10-CM | POA: Diagnosis not present

## 2016-11-02 LAB — VALPROIC ACID LEVEL, FREE: Valproic Acid, Free: 6.3 ug/mL (ref 6.0–22.0)

## 2016-11-03 DIAGNOSIS — F29 Unspecified psychosis not due to a substance or known physiological condition: Secondary | ICD-10-CM | POA: Diagnosis not present

## 2016-11-03 DIAGNOSIS — I110 Hypertensive heart disease with heart failure: Secondary | ICD-10-CM | POA: Diagnosis not present

## 2016-11-03 DIAGNOSIS — R41841 Cognitive communication deficit: Secondary | ICD-10-CM | POA: Diagnosis not present

## 2016-11-03 DIAGNOSIS — G308 Other Alzheimer's disease: Secondary | ICD-10-CM | POA: Diagnosis not present

## 2016-11-03 DIAGNOSIS — I5022 Chronic systolic (congestive) heart failure: Secondary | ICD-10-CM | POA: Diagnosis not present

## 2016-11-03 DIAGNOSIS — F39 Unspecified mood [affective] disorder: Secondary | ICD-10-CM | POA: Diagnosis not present

## 2016-11-07 DIAGNOSIS — F29 Unspecified psychosis not due to a substance or known physiological condition: Secondary | ICD-10-CM | POA: Diagnosis not present

## 2016-11-07 DIAGNOSIS — I110 Hypertensive heart disease with heart failure: Secondary | ICD-10-CM | POA: Diagnosis not present

## 2016-11-07 DIAGNOSIS — G308 Other Alzheimer's disease: Secondary | ICD-10-CM | POA: Diagnosis not present

## 2016-11-07 DIAGNOSIS — F39 Unspecified mood [affective] disorder: Secondary | ICD-10-CM | POA: Diagnosis not present

## 2016-11-07 DIAGNOSIS — R41841 Cognitive communication deficit: Secondary | ICD-10-CM | POA: Diagnosis not present

## 2016-11-07 DIAGNOSIS — I5022 Chronic systolic (congestive) heart failure: Secondary | ICD-10-CM | POA: Diagnosis not present

## 2016-11-08 DIAGNOSIS — I110 Hypertensive heart disease with heart failure: Secondary | ICD-10-CM | POA: Diagnosis not present

## 2016-11-08 DIAGNOSIS — R41841 Cognitive communication deficit: Secondary | ICD-10-CM | POA: Diagnosis not present

## 2016-11-08 DIAGNOSIS — F39 Unspecified mood [affective] disorder: Secondary | ICD-10-CM | POA: Diagnosis not present

## 2016-11-08 DIAGNOSIS — G308 Other Alzheimer's disease: Secondary | ICD-10-CM | POA: Diagnosis not present

## 2016-11-08 DIAGNOSIS — I5022 Chronic systolic (congestive) heart failure: Secondary | ICD-10-CM | POA: Diagnosis not present

## 2016-11-08 DIAGNOSIS — F29 Unspecified psychosis not due to a substance or known physiological condition: Secondary | ICD-10-CM | POA: Diagnosis not present

## 2016-11-09 DIAGNOSIS — I110 Hypertensive heart disease with heart failure: Secondary | ICD-10-CM | POA: Diagnosis not present

## 2016-11-09 DIAGNOSIS — F29 Unspecified psychosis not due to a substance or known physiological condition: Secondary | ICD-10-CM | POA: Diagnosis not present

## 2016-11-09 DIAGNOSIS — I5022 Chronic systolic (congestive) heart failure: Secondary | ICD-10-CM | POA: Diagnosis not present

## 2016-11-09 DIAGNOSIS — G308 Other Alzheimer's disease: Secondary | ICD-10-CM | POA: Diagnosis not present

## 2016-11-09 DIAGNOSIS — R41841 Cognitive communication deficit: Secondary | ICD-10-CM | POA: Diagnosis not present

## 2016-11-09 DIAGNOSIS — F39 Unspecified mood [affective] disorder: Secondary | ICD-10-CM | POA: Diagnosis not present

## 2016-11-14 ENCOUNTER — Other Ambulatory Visit
Admission: RE | Admit: 2016-11-14 | Discharge: 2016-11-14 | Disposition: A | Discharge status: Home / Self Care | Payer: Medicare Other | Source: Ambulatory Visit | Attending: Gerontology | Admitting: Gerontology

## 2016-11-14 DIAGNOSIS — I2510 Atherosclerotic heart disease of native coronary artery without angina pectoris: Secondary | ICD-10-CM | POA: Insufficient documentation

## 2016-11-14 DIAGNOSIS — F29 Unspecified psychosis not due to a substance or known physiological condition: Secondary | ICD-10-CM | POA: Diagnosis not present

## 2016-11-14 DIAGNOSIS — R41841 Cognitive communication deficit: Secondary | ICD-10-CM | POA: Diagnosis not present

## 2016-11-14 DIAGNOSIS — I5022 Chronic systolic (congestive) heart failure: Secondary | ICD-10-CM | POA: Diagnosis not present

## 2016-11-14 DIAGNOSIS — F39 Unspecified mood [affective] disorder: Secondary | ICD-10-CM | POA: Diagnosis not present

## 2016-11-14 DIAGNOSIS — G308 Other Alzheimer's disease: Secondary | ICD-10-CM | POA: Diagnosis not present

## 2016-11-14 DIAGNOSIS — I110 Hypertensive heart disease with heart failure: Secondary | ICD-10-CM | POA: Diagnosis not present

## 2016-11-14 LAB — COMPREHENSIVE METABOLIC PANEL
ALBUMIN: 3.2 g/dL — AB (ref 3.5–5.0)
ALT: 16 U/L (ref 14–54)
AST: 20 U/L (ref 15–41)
Alkaline Phosphatase: 58 U/L (ref 38–126)
Anion gap: 8 (ref 5–15)
BILIRUBIN TOTAL: 0.7 mg/dL (ref 0.3–1.2)
BUN: 34 mg/dL — AB (ref 6–20)
CALCIUM: 8.8 mg/dL — AB (ref 8.9–10.3)
CO2: 26 mmol/L (ref 22–32)
CREATININE: 1.24 mg/dL — AB (ref 0.44–1.00)
Chloride: 103 mmol/L (ref 101–111)
GFR calc Af Amer: 44 mL/min — ABNORMAL LOW (ref >60)
GFR, EST NON AFRICAN AMERICAN: 38 mL/min — AB (ref >60)
GLUCOSE: 109 mg/dL — AB (ref 65–99)
Potassium: 3.3 mmol/L — ABNORMAL LOW (ref 3.5–5.1)
Sodium: 137 mmol/L (ref 135–145)
Total Protein: 5.9 g/dL — ABNORMAL LOW (ref 6.5–8.1)

## 2016-11-15 DIAGNOSIS — I110 Hypertensive heart disease with heart failure: Secondary | ICD-10-CM | POA: Diagnosis not present

## 2016-11-15 DIAGNOSIS — R41841 Cognitive communication deficit: Secondary | ICD-10-CM | POA: Diagnosis not present

## 2016-11-15 DIAGNOSIS — F29 Unspecified psychosis not due to a substance or known physiological condition: Secondary | ICD-10-CM | POA: Diagnosis not present

## 2016-11-15 DIAGNOSIS — F39 Unspecified mood [affective] disorder: Secondary | ICD-10-CM | POA: Diagnosis not present

## 2016-11-15 DIAGNOSIS — G308 Other Alzheimer's disease: Secondary | ICD-10-CM | POA: Diagnosis not present

## 2016-11-15 DIAGNOSIS — I5022 Chronic systolic (congestive) heart failure: Secondary | ICD-10-CM | POA: Diagnosis not present

## 2016-11-16 DIAGNOSIS — I5022 Chronic systolic (congestive) heart failure: Secondary | ICD-10-CM | POA: Diagnosis not present

## 2016-11-16 DIAGNOSIS — F29 Unspecified psychosis not due to a substance or known physiological condition: Secondary | ICD-10-CM | POA: Diagnosis not present

## 2016-11-16 DIAGNOSIS — G308 Other Alzheimer's disease: Secondary | ICD-10-CM | POA: Diagnosis not present

## 2016-11-16 DIAGNOSIS — R41841 Cognitive communication deficit: Secondary | ICD-10-CM | POA: Diagnosis not present

## 2016-11-16 DIAGNOSIS — F39 Unspecified mood [affective] disorder: Secondary | ICD-10-CM | POA: Diagnosis not present

## 2016-11-16 DIAGNOSIS — I110 Hypertensive heart disease with heart failure: Secondary | ICD-10-CM | POA: Diagnosis not present

## 2016-11-17 DIAGNOSIS — F29 Unspecified psychosis not due to a substance or known physiological condition: Secondary | ICD-10-CM | POA: Diagnosis not present

## 2016-11-17 DIAGNOSIS — I110 Hypertensive heart disease with heart failure: Secondary | ICD-10-CM | POA: Diagnosis not present

## 2016-11-17 DIAGNOSIS — I5022 Chronic systolic (congestive) heart failure: Secondary | ICD-10-CM | POA: Diagnosis not present

## 2016-11-17 DIAGNOSIS — R41841 Cognitive communication deficit: Secondary | ICD-10-CM | POA: Diagnosis not present

## 2016-11-17 DIAGNOSIS — F39 Unspecified mood [affective] disorder: Secondary | ICD-10-CM | POA: Diagnosis not present

## 2016-11-17 DIAGNOSIS — G308 Other Alzheimer's disease: Secondary | ICD-10-CM | POA: Diagnosis not present

## 2016-11-21 ENCOUNTER — Encounter: Payer: Self-pay | Admitting: Gerontology

## 2016-11-21 ENCOUNTER — Non-Acute Institutional Stay (SKILLED_NURSING_FACILITY): Payer: Medicare Other | Admitting: Gerontology

## 2016-11-21 DIAGNOSIS — G43909 Migraine, unspecified, not intractable, without status migrainosus: Secondary | ICD-10-CM

## 2016-11-21 DIAGNOSIS — I252 Old myocardial infarction: Secondary | ICD-10-CM

## 2016-11-21 DIAGNOSIS — F39 Unspecified mood [affective] disorder: Secondary | ICD-10-CM

## 2016-11-21 DIAGNOSIS — E785 Hyperlipidemia, unspecified: Secondary | ICD-10-CM

## 2016-11-21 DIAGNOSIS — E538 Deficiency of other specified B group vitamins: Secondary | ICD-10-CM

## 2016-11-21 DIAGNOSIS — I4891 Unspecified atrial fibrillation: Secondary | ICD-10-CM | POA: Diagnosis not present

## 2016-11-21 DIAGNOSIS — F02818 Dementia in other diseases classified elsewhere, unspecified severity, with other behavioral disturbance: Secondary | ICD-10-CM

## 2016-11-21 DIAGNOSIS — R001 Bradycardia, unspecified: Secondary | ICD-10-CM | POA: Diagnosis not present

## 2016-11-21 DIAGNOSIS — E1122 Type 2 diabetes mellitus with diabetic chronic kidney disease: Secondary | ICD-10-CM

## 2016-11-21 DIAGNOSIS — Z955 Presence of coronary angioplasty implant and graft: Secondary | ICD-10-CM

## 2016-11-21 DIAGNOSIS — Z951 Presence of aortocoronary bypass graft: Secondary | ICD-10-CM

## 2016-11-21 DIAGNOSIS — I5022 Chronic systolic (congestive) heart failure: Secondary | ICD-10-CM

## 2016-11-21 DIAGNOSIS — Z8673 Personal history of transient ischemic attack (TIA), and cerebral infarction without residual deficits: Secondary | ICD-10-CM

## 2016-11-21 DIAGNOSIS — F29 Unspecified psychosis not due to a substance or known physiological condition: Secondary | ICD-10-CM | POA: Diagnosis not present

## 2016-11-21 DIAGNOSIS — G308 Other Alzheimer's disease: Secondary | ICD-10-CM

## 2016-11-21 DIAGNOSIS — I110 Hypertensive heart disease with heart failure: Secondary | ICD-10-CM | POA: Diagnosis not present

## 2016-11-21 DIAGNOSIS — E876 Hypokalemia: Secondary | ICD-10-CM

## 2016-11-21 DIAGNOSIS — M1990 Unspecified osteoarthritis, unspecified site: Secondary | ICD-10-CM

## 2016-11-21 DIAGNOSIS — N182 Chronic kidney disease, stage 2 (mild): Secondary | ICD-10-CM

## 2016-11-21 DIAGNOSIS — F0281 Dementia in other diseases classified elsewhere with behavioral disturbance: Secondary | ICD-10-CM | POA: Diagnosis not present

## 2016-11-21 DIAGNOSIS — F0280 Dementia in other diseases classified elsewhere without behavioral disturbance: Secondary | ICD-10-CM

## 2016-11-21 NOTE — Progress Notes (Signed)
Location:   [facility] Room Number: [place] Level of Service:  SNF [phone]) Provider:  Toni Arthurs, NP-C   Patient Care Team: Albina Billet, MD as PCP - General (Internal Medicine)  Extended Emergency Contact Information Primary Emergency Contact: Coble,Dean Address: [address], [place] [address] [place] Phone: [phone] Relation: Son Secondary Emergency Contact: Jolaine Click Address: [place],  [address] Home Phone: [phone] Work Phone: [phone] Relation: None  Code Status:  DNR Goals of care: Advanced Directive information Advanced Directives 11/21/2016  Does Patient Have a Medical Advance Directive? Yes  Type of Advance Directive Out of facility DNR (pink MOST or yellow form)  Does patient want to make changes to medical advance directive? No - Patient declined  Pre-existing out of facility DNR order (yellow form or pink MOST form) -     Chief Complaint  Patient presents with  . Medical Management of Chronic Issues    Routine Visit    HPI:  Pt is a 83 y.o. female seen today for medical management of chronic diseases. Pt has been increasingly unstable over the past month. She had began to have an increase in angry outbursts, elopement and combativeness. She seems to have declined more over this past month. No longer an elopement risk. Sleeping more. Having increased confusion, forgetfulness. Staff reports times of having to feed pt her meals- now at least one meal per day. Dose of Depakote was recently increased, which seems to have helped. [facility] follows pt. Medication adjustments made. Transitioned from Seroquel to Risperidone. Topamax being reduced to decrease polypharmacy. Being followed by Palliative Care- pt does not yet meet Hospice criteria. VSS   Past Medical History:  Diagnosis Date  . A-fib ([place])   . Chronic CHF (congestive heart failure) ([place])   . Diabetes mellitus with  stage 2 chronic kidney disease ([place])   . Hx of transient ischemic attack (TIA)   . Hyperlipidemia   . Hypertension   . Varicose veins    Past Surgical History:  Procedure Laterality Date  . APPENDECTOMY    . CHOLECYSTECTOMY    . CORONARY ARTERY BYPASS GRAFT N/A 07/27/2013   Procedure: CORONARY ARTERY BYPASS GRAFTING (CABG) POSS. MITRAL REPAIR;  Surgeon: [name], MD;  Location: [facility];  Service: Open Heart Surgery;  Laterality: N/A;  Coronary artery bypass graft times four using left internal mammary artery and right saphenous leg vein using endoscope.  Marland Kitchen KNEE SURGERY      No Known Allergies  Allergies as of 11/21/2016   No Known Allergies     Medication List       Accurate as of 11/21/16  9:47 AM. Always use your most recent med list.          acetaminophen 325 MG tablet Commonly known as:  TYLENOL Take 650 mg by mouth every 4 (four) hours as needed for fever. May administer orally, per G-tube if needed or rectally if unable to swallow. Maximum dose for 24 hours is 3000 mg from all sources of Acetaminophen /Tylenol   acetaminophen 325 MG tablet Commonly known as:  TYLENOL Take 650 mg by mouth 3 (three) times daily.   ASPERCREME W/LIDOCAINE 4 % cream Generic drug:  lidocaine Apply 1 application topically 3 (three) times daily. Apply thin layer to bilateral knees   aspirin 81 MG chewable tablet  Chew 1 tablet (81 mg total) by mouth daily.   Cholecalciferol 2000 units Caps Take 1 capsule by mouth daily.   cyanocobalamin 1000 MCG tablet Take 1,000 mcg by mouth daily.   divalproex 500 MG DR tablet Commonly known as:  DEPAKOTE Take 500 mg by mouth 2 (two) times daily.   ELIQUIS 2.5 MG Tabs tablet Generic drug:  apixaban Take 2.5 mg by mouth 2 (two) times daily.   LORazepam 0.5 MG tablet Commonly known as:  ATIVAN Take 0.5 mg by mouth daily. At 4 pm. Ok to hold for sedation.   LORazepam 0.5 MG tablet Commonly known as:  ATIVAN Take 1 tablet (0.5 mg  total) by mouth daily as needed.   Magnesium Oxide 200 MG Tabs Take 200 mg by mouth daily.   polyethylene glycol packet Commonly known as:  MIRALAX / GLYCOLAX Take 17 g by mouth daily.   potassium chloride SA 20 MEQ tablet Commonly known as:  K-DUR,KLOR-CON Take 20 mEq by mouth 2 (two) times daily.   PRESERVISION AREDS Tabs Take 1 tablet by mouth daily.   risperiDONE 0.5 MG tablet Commonly known as:  RISPERDAL Take 0.25-0.5 mg by mouth 2 (two) times daily. Give 1/2 (0.25 mg) tablet in morning and 1 (0.5 mg) tablet in the evening for psychosis.   SENNA-PLUS 8.6-50 MG tablet Generic drug:  senna-docusate Take 1 tablet by mouth 2 (two) times daily.   topiramate 25 MG tablet Commonly known as:  TOPAMAX Take 25 mg by mouth 2 (two) times daily.   torsemide 20 MG tablet Commonly known as:  DEMADEX Take 30 mg by mouth daily. 1 and 1/2 tablet   UTI-STAT Liqd Take 30 mLs by mouth daily.       Review of Systems  Unable to perform ROS: Dementia  Constitutional: Negative for activity change, appetite change, chills, diaphoresis and fever.  HENT: Negative.   Respiratory: Negative for cough, choking, chest tightness, shortness of breath and wheezing.   Cardiovascular: Negative for chest pain, palpitations and leg swelling.  Gastrointestinal: Negative for abdominal distention, abdominal pain, constipation, diarrhea and nausea.  Genitourinary: Negative for difficulty urinating, dysuria, frequency and urgency.  Musculoskeletal: Positive for arthralgias (typical arthritis). Negative for back pain, gait problem and myalgias.  Skin: Negative for color change, pallor and rash.  Neurological: Negative for dizziness, tremors, syncope, speech difficulty, weakness, numbness and headaches.  Psychiatric/Behavioral: Positive for agitation, behavioral problems and confusion.  All other systems reviewed and are negative.   Immunization History  Administered Date(s) Administered  .  Influenza-Unspecified 11/07/2013, 11/17/2014, 11/11/2015  . PPD Test 11/24/2014, 11/27/2015  . Pneumococcal-Unspecified 01/21/2014   Pertinent  Health Maintenance Due  Topic Date Due  . FOOT EXAM  10/06/1938  . OPHTHALMOLOGY EXAM  10/06/1938  . URINE MICROALBUMIN  [birth year]06/1938  . DEXA SCAN  10/07/1995  . HEMOGLOBIN A1C  01/26/2014  . PNA vac Low Risk Adult (2 of 2 - PCV13) 01/22/2015  . INFLUENZA VACCINE  09/27/2016   No flowsheet data found. Functional Status Survey:    Vitals:   11/21/16 0934  BP: (!) 112/54  Pulse: 89  Resp: (!) 24  Temp: (!) 97.4 F (36.3 C)  SpO2: 98%  Weight: 205 lb 4.8 oz (93.1 kg)  Height: _0  (1.702 m)   Body mass index is 32.15 kg/m. Physical Exam  Constitutional: She is oriented to person, place, and time. Vital signs are normal. She appears well-developed and well-nourished. She is active and cooperative. She does not appear ill.  No distress.  HENT:  Head: Normocephalic and atraumatic.  Mouth/Throat: Uvula is midline, oropharynx is clear and moist and mucous membranes are normal. Mucous membranes are not pale, not dry and not cyanotic.  Eyes: Pupils are equal, round, and reactive to light. Conjunctivae, EOM and lids are normal.  Neck: Trachea normal, normal range of motion and full passive range of motion without pain. Neck supple. No JVD present. No tracheal deviation, no edema and no erythema present. No thyromegaly present.  Cardiovascular: Normal rate, regular rhythm, intact distal pulses and normal pulses.  Exam reveals no gallop, no distant heart sounds and no friction rub.   No murmur heard. Pulses:      Dorsalis pedis pulses are 2+ on the right side, and 2+ on the left side.  BLE edema  Pulmonary/Chest: Effort normal and breath sounds normal. No accessory muscle usage. No respiratory distress. She has no decreased breath sounds. She has no wheezes. She has no rhonchi. She has no rales. She exhibits no tenderness.  Abdominal: Normal  appearance and bowel sounds are normal. She exhibits no distension and no ascites. There is no tenderness.  Musculoskeletal: Normal range of motion. She exhibits no edema or tenderness.  Expected osteoarthritis, stiffness  Neurological: She is alert and oriented to person, place, and time. She has normal strength.  Skin: Skin is warm, dry and intact. No rash noted. She is not diaphoretic. No cyanosis or erythema. No pallor. Nails show no clubbing.  See description in the HPI  Psychiatric: Her speech is normal. Her affect is labile. She is agitated. Thought content is paranoid and delusional. Cognition and memory are impaired. She expresses impulsivity and inappropriate judgment. She exhibits abnormal recent memory and abnormal remote memory.  Nursing note and vitals reviewed.   Labs reviewed:  Recent Labs  04/27/16 0640 11/01/16 0423 11/14/16 0655  NA 140 147* 137  K 3.9 3.2* 3.3*  CL 109 109 103  CO2 _0 GLUCOSE 108* 127* 109*  BUN 29* 45* 34*  CREATININE 1.60* 1.86* 1.24*  CALCIUM 8.7* 9.3 8.8*  MG 2.4  --   --     Recent Labs  04/27/16 0640 11/01/16 0423 11/14/16 0655  AST _1 ALT _2 ALKPHOS 59 82 58  BILITOT 0.7 0.8 0.7  PROT 6.0* 6.9 5.9*  ALBUMIN 3.2* 3.6 3.2*    Recent Labs  04/27/16 0640 11/01/16 0423  WBC 6.6 5.3  NEUTROABS 4.2 2.9  HGB 10.7* 13.9  HCT 31.6* 42.4  MCV 90.1 97.4  PLT 144* 147*   Lab Results  Component Value Date   TSH 2.774 04/27/2016   Lab Results  Component Value Date   HGBA1C 6.0 (H) 07/26/2013   Lab Results  Component Value Date   CHOL 142 04/27/2016   HDL 56 04/27/2016   LDLCALC 70 04/27/2016   TRIG 81 04/27/2016   CHOLHDL 2.5 04/27/2016    Significant Diagnostic Results in last 30 days:  No results found.  Assessment/Plan 1. Other Alzheimer's disease  Variable  Progressive   2. 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  Unspecified psychosis not due to a substance or known physiological condition  Variable  Continue Depakote sprinkles 500 mg po BID  Continue Cholecalciferol 2,000 units po Q Day  Risperdol 0.5 mg po BID- 1/2 tablet in the AM, 1 tablet in the PM  4. Mood disorder (HCC)  Variable  Increase Depakote  sprinkles 500 mg po BID  Continue Cholecalciferol 2,000 units po Q Day  Risperdol 0.5 mg po BID- 1/2 tablet in the AM, 1 tablet in the PM  5. Type 2 diabetes mellitus with stage 2 chronic kidney disease, without long-term current use of insulin (HCC)  Stable   6. Hypertensive heart disease with heart failure (HCC)  Stable  Continue Lisinopril 2.5 mg po Q Day  Continue ASA 81 mg po Q Day  7. Chronic systolic congestive heart failure (HCC)  Stable  Increase Torsemide 30 mg po Q Day  8. Presence of coronary artery bypass graft stent  Stable   9. Bradycardia  Stable   10. Deficiency of other specified B group vitamins (CODE)  Stable  Continue Cyanocobalamin 1,000 mcg po Q Day  11. Old myocardial infarction  Stable   12. Atrial fibrillation, unspecified type (HCC)  Stable  Continue Eliquis 2.5 mg po BID  13. Hyperlipidemia, unspecified hyperlipidemia type  Stable  14. Hypomagnesemia  Stable  Decrease Magnesium Oxide 200 mg po Q day  15. Hypokalemia  Stable  Increase Potassium Chloride 20 meq po BID  16. Personal history of transient ischemic attack (TIA), and cerebral infarction without residual deficits  Stable   17. Migraine without status migrainosus, not intractable, unspecified migraine type  Stable  Continue Topamax 25 mg po BID  18. Osteoarthritis  Continue Tylenol 650 mg po TID  Continue Voltaren 1% gel- 4 grams to both knees QID   Family/ staff Communication:   Total Time:  Documentation:  Face to Face:  Family/Phone:   Labs/tests ordered:  Cbc, met c  Medication list reviewed and assessed for continued  appropriateness. Monthly medication orders reviewed and signed.  Vikki Ports, NP-C Geriatrics [facility] [phone], [place] [address] Cell Phone (Mon-Fri 8am-5pm):  [phone] On Call:  [phone] & follow prompts after 5pm & weekends Office Phone:  [phone] Office Fax:  [phone]

## 2016-11-22 DIAGNOSIS — G308 Other Alzheimer's disease: Secondary | ICD-10-CM | POA: Diagnosis not present

## 2016-11-22 DIAGNOSIS — I5022 Chronic systolic (congestive) heart failure: Secondary | ICD-10-CM | POA: Diagnosis not present

## 2016-11-22 DIAGNOSIS — F29 Unspecified psychosis not due to a substance or known physiological condition: Secondary | ICD-10-CM | POA: Diagnosis not present

## 2016-11-22 DIAGNOSIS — I110 Hypertensive heart disease with heart failure: Secondary | ICD-10-CM | POA: Diagnosis not present

## 2016-11-22 DIAGNOSIS — F39 Unspecified mood [affective] disorder: Secondary | ICD-10-CM | POA: Diagnosis not present

## 2016-11-22 DIAGNOSIS — R41841 Cognitive communication deficit: Secondary | ICD-10-CM | POA: Diagnosis not present

## 2016-11-23 DIAGNOSIS — I5022 Chronic systolic (congestive) heart failure: Secondary | ICD-10-CM | POA: Diagnosis not present

## 2016-11-23 DIAGNOSIS — G308 Other Alzheimer's disease: Secondary | ICD-10-CM | POA: Diagnosis not present

## 2016-11-23 DIAGNOSIS — F29 Unspecified psychosis not due to a substance or known physiological condition: Secondary | ICD-10-CM | POA: Diagnosis not present

## 2016-11-23 DIAGNOSIS — R41841 Cognitive communication deficit: Secondary | ICD-10-CM | POA: Diagnosis not present

## 2016-11-23 DIAGNOSIS — I110 Hypertensive heart disease with heart failure: Secondary | ICD-10-CM | POA: Diagnosis not present

## 2016-11-23 DIAGNOSIS — F39 Unspecified mood [affective] disorder: Secondary | ICD-10-CM | POA: Diagnosis not present

## 2016-11-24 DIAGNOSIS — G308 Other Alzheimer's disease: Secondary | ICD-10-CM | POA: Diagnosis not present

## 2016-11-24 DIAGNOSIS — R41841 Cognitive communication deficit: Secondary | ICD-10-CM | POA: Diagnosis not present

## 2016-11-24 DIAGNOSIS — F39 Unspecified mood [affective] disorder: Secondary | ICD-10-CM | POA: Diagnosis not present

## 2016-11-24 DIAGNOSIS — I5022 Chronic systolic (congestive) heart failure: Secondary | ICD-10-CM | POA: Diagnosis not present

## 2016-11-24 DIAGNOSIS — I110 Hypertensive heart disease with heart failure: Secondary | ICD-10-CM | POA: Diagnosis not present

## 2016-11-24 DIAGNOSIS — F29 Unspecified psychosis not due to a substance or known physiological condition: Secondary | ICD-10-CM | POA: Diagnosis not present

## 2016-11-27 ENCOUNTER — Encounter
Admission: RE | Admit: 2016-11-27 | Discharge: 2016-11-27 | Disposition: A | Discharge status: Home / Self Care | Payer: Medicare Other | Source: Ambulatory Visit | Attending: Internal Medicine | Admitting: Internal Medicine

## 2016-11-27 ENCOUNTER — Other Ambulatory Visit: Payer: Self-pay

## 2016-11-27 DIAGNOSIS — R269 Unspecified abnormalities of gait and mobility: Secondary | ICD-10-CM | POA: Diagnosis not present

## 2016-11-27 DIAGNOSIS — R279 Unspecified lack of coordination: Secondary | ICD-10-CM | POA: Diagnosis not present

## 2016-11-27 DIAGNOSIS — I5022 Chronic systolic (congestive) heart failure: Secondary | ICD-10-CM | POA: Diagnosis not present

## 2016-11-27 DIAGNOSIS — R293 Abnormal posture: Secondary | ICD-10-CM | POA: Diagnosis not present

## 2016-11-27 DIAGNOSIS — G308 Other Alzheimer's disease: Secondary | ICD-10-CM | POA: Diagnosis not present

## 2016-11-27 DIAGNOSIS — F29 Unspecified psychosis not due to a substance or known physiological condition: Secondary | ICD-10-CM | POA: Diagnosis not present

## 2016-11-27 DIAGNOSIS — R41841 Cognitive communication deficit: Secondary | ICD-10-CM | POA: Diagnosis not present

## 2016-11-27 DIAGNOSIS — F39 Unspecified mood [affective] disorder: Secondary | ICD-10-CM | POA: Diagnosis not present

## 2016-11-27 DIAGNOSIS — M6281 Muscle weakness (generalized): Secondary | ICD-10-CM | POA: Diagnosis not present

## 2016-11-27 MED ORDER — LORAZEPAM 0.5 MG PO TABS: 0.5000 mg | ORAL_TABLET | Freq: Every day | ORAL | 3 refills | Status: DC

## 2016-11-27 NOTE — Telephone Encounter (Signed)
Rx sent to [facility] phone : [phone] , fax : [phone]  

## 2016-11-28 DIAGNOSIS — R41841 Cognitive communication deficit: Secondary | ICD-10-CM | POA: Diagnosis not present

## 2016-11-28 DIAGNOSIS — G308 Other Alzheimer's disease: Secondary | ICD-10-CM | POA: Diagnosis not present

## 2016-11-28 DIAGNOSIS — F29 Unspecified psychosis not due to a substance or known physiological condition: Secondary | ICD-10-CM | POA: Diagnosis not present

## 2016-11-28 DIAGNOSIS — F39 Unspecified mood [affective] disorder: Secondary | ICD-10-CM | POA: Diagnosis not present

## 2016-11-28 DIAGNOSIS — I5022 Chronic systolic (congestive) heart failure: Secondary | ICD-10-CM | POA: Diagnosis not present

## 2016-11-28 DIAGNOSIS — M6281 Muscle weakness (generalized): Secondary | ICD-10-CM | POA: Diagnosis not present

## 2016-11-29 DIAGNOSIS — F39 Unspecified mood [affective] disorder: Secondary | ICD-10-CM | POA: Diagnosis not present

## 2016-11-29 DIAGNOSIS — I5022 Chronic systolic (congestive) heart failure: Secondary | ICD-10-CM | POA: Diagnosis not present

## 2016-11-29 DIAGNOSIS — M6281 Muscle weakness (generalized): Secondary | ICD-10-CM | POA: Diagnosis not present

## 2016-11-29 DIAGNOSIS — F29 Unspecified psychosis not due to a substance or known physiological condition: Secondary | ICD-10-CM | POA: Diagnosis not present

## 2016-11-29 DIAGNOSIS — R41841 Cognitive communication deficit: Secondary | ICD-10-CM | POA: Diagnosis not present

## 2016-11-29 DIAGNOSIS — G308 Other Alzheimer's disease: Secondary | ICD-10-CM | POA: Diagnosis not present

## 2016-12-01 DIAGNOSIS — M6281 Muscle weakness (generalized): Secondary | ICD-10-CM | POA: Diagnosis not present

## 2016-12-01 DIAGNOSIS — I5022 Chronic systolic (congestive) heart failure: Secondary | ICD-10-CM | POA: Diagnosis not present

## 2016-12-01 DIAGNOSIS — R41841 Cognitive communication deficit: Secondary | ICD-10-CM | POA: Diagnosis not present

## 2016-12-01 DIAGNOSIS — F39 Unspecified mood [affective] disorder: Secondary | ICD-10-CM | POA: Diagnosis not present

## 2016-12-01 DIAGNOSIS — F29 Unspecified psychosis not due to a substance or known physiological condition: Secondary | ICD-10-CM | POA: Diagnosis not present

## 2016-12-01 DIAGNOSIS — G308 Other Alzheimer's disease: Secondary | ICD-10-CM | POA: Diagnosis not present

## 2016-12-04 DIAGNOSIS — R41841 Cognitive communication deficit: Secondary | ICD-10-CM | POA: Diagnosis not present

## 2016-12-04 DIAGNOSIS — G308 Other Alzheimer's disease: Secondary | ICD-10-CM | POA: Diagnosis not present

## 2016-12-04 DIAGNOSIS — M6281 Muscle weakness (generalized): Secondary | ICD-10-CM | POA: Diagnosis not present

## 2016-12-04 DIAGNOSIS — F29 Unspecified psychosis not due to a substance or known physiological condition: Secondary | ICD-10-CM | POA: Diagnosis not present

## 2016-12-04 DIAGNOSIS — I5022 Chronic systolic (congestive) heart failure: Secondary | ICD-10-CM | POA: Diagnosis not present

## 2016-12-04 DIAGNOSIS — F39 Unspecified mood [affective] disorder: Secondary | ICD-10-CM | POA: Diagnosis not present

## 2016-12-05 ENCOUNTER — Encounter: Payer: Self-pay | Admitting: Gerontology

## 2016-12-05 NOTE — Progress Notes (Signed)
Opened in error; Disregard.

## 2016-12-06 ENCOUNTER — Other Ambulatory Visit
Admission: RE | Admit: 2016-12-06 | Discharge: 2016-12-06 | Disposition: A | Discharge status: Home / Self Care | Payer: Medicare Other | Source: Ambulatory Visit | Attending: Gerontology | Admitting: Gerontology

## 2016-12-06 DIAGNOSIS — I2510 Atherosclerotic heart disease of native coronary artery without angina pectoris: Secondary | ICD-10-CM | POA: Diagnosis not present

## 2016-12-06 LAB — COMPREHENSIVE METABOLIC PANEL
ALBUMIN: 2.8 g/dL — AB (ref 3.5–5.0)
ALK PHOS: 49 U/L (ref 38–126)
ALT: 12 U/L — AB (ref 14–54)
ANION GAP: 9 (ref 5–15)
AST: 16 U/L (ref 15–41)
BILIRUBIN TOTAL: 0.5 mg/dL (ref 0.3–1.2)
BUN: 38 mg/dL — ABNORMAL HIGH (ref 6–20)
CALCIUM: 8.8 mg/dL — AB (ref 8.9–10.3)
CO2: 26 mmol/L (ref 22–32)
Chloride: 104 mmol/L (ref 101–111)
Creatinine, Ser: 1.44 mg/dL — ABNORMAL HIGH (ref 0.44–1.00)
GFR calc Af Amer: 37 mL/min — ABNORMAL LOW (ref >60)
GFR calc non Af Amer: 32 mL/min — ABNORMAL LOW (ref >60)
Glucose, Bld: 93 mg/dL (ref 65–99)
POTASSIUM: 4 mmol/L (ref 3.5–5.1)
Sodium: 139 mmol/L (ref 135–145)
TOTAL PROTEIN: 5.6 g/dL — AB (ref 6.5–8.1)

## 2016-12-19 ENCOUNTER — Non-Acute Institutional Stay (SKILLED_NURSING_FACILITY): Payer: Medicare Other | Admitting: Gerontology

## 2016-12-19 DIAGNOSIS — R001 Bradycardia, unspecified: Secondary | ICD-10-CM

## 2016-12-19 DIAGNOSIS — I110 Hypertensive heart disease with heart failure: Secondary | ICD-10-CM

## 2016-12-19 DIAGNOSIS — Z955 Presence of coronary angioplasty implant and graft: Secondary | ICD-10-CM | POA: Diagnosis not present

## 2016-12-19 DIAGNOSIS — E538 Deficiency of other specified B group vitamins: Secondary | ICD-10-CM | POA: Diagnosis not present

## 2016-12-19 DIAGNOSIS — F29 Unspecified psychosis not due to a substance or known physiological condition: Secondary | ICD-10-CM | POA: Diagnosis not present

## 2016-12-19 DIAGNOSIS — I4891 Unspecified atrial fibrillation: Secondary | ICD-10-CM | POA: Diagnosis not present

## 2016-12-19 DIAGNOSIS — G308 Other Alzheimer's disease: Secondary | ICD-10-CM | POA: Diagnosis not present

## 2016-12-19 DIAGNOSIS — F0280 Dementia in other diseases classified elsewhere without behavioral disturbance: Secondary | ICD-10-CM

## 2016-12-19 DIAGNOSIS — I5022 Chronic systolic (congestive) heart failure: Secondary | ICD-10-CM

## 2016-12-19 DIAGNOSIS — F39 Unspecified mood [affective] disorder: Secondary | ICD-10-CM

## 2016-12-19 DIAGNOSIS — E785 Hyperlipidemia, unspecified: Secondary | ICD-10-CM

## 2016-12-19 DIAGNOSIS — N182 Chronic kidney disease, stage 2 (mild): Secondary | ICD-10-CM

## 2016-12-19 DIAGNOSIS — F0281 Dementia in other diseases classified elsewhere with behavioral disturbance: Secondary | ICD-10-CM

## 2016-12-19 DIAGNOSIS — Z8673 Personal history of transient ischemic attack (TIA), and cerebral infarction without residual deficits: Secondary | ICD-10-CM

## 2016-12-19 DIAGNOSIS — E1122 Type 2 diabetes mellitus with diabetic chronic kidney disease: Secondary | ICD-10-CM

## 2016-12-19 DIAGNOSIS — I252 Old myocardial infarction: Secondary | ICD-10-CM | POA: Diagnosis not present

## 2016-12-19 DIAGNOSIS — Z951 Presence of aortocoronary bypass graft: Secondary | ICD-10-CM

## 2016-12-19 DIAGNOSIS — G43909 Migraine, unspecified, not intractable, without status migrainosus: Secondary | ICD-10-CM

## 2016-12-19 DIAGNOSIS — E876 Hypokalemia: Secondary | ICD-10-CM

## 2016-12-19 DIAGNOSIS — M1990 Unspecified osteoarthritis, unspecified site: Secondary | ICD-10-CM

## 2016-12-20 ENCOUNTER — Encounter: Payer: Self-pay | Admitting: Gerontology

## 2016-12-26 ENCOUNTER — Other Ambulatory Visit: Payer: Self-pay

## 2016-12-26 MED ORDER — LORAZEPAM 0.5 MG PO TABS: 0.2500 mg | ORAL_TABLET | Freq: Every day | ORAL | 3 refills | Status: DC

## 2016-12-26 NOTE — Telephone Encounter (Signed)
Rx sent to [facility] phone : [phone] , fax : [phone]  

## 2016-12-27 NOTE — Progress Notes (Signed)
Location:   [facility] Room Number: [place] of Service:  SNF [phone]) Provider:  Toni Arthurs, NP-C   Patient Care Team: Albina Billet, MD as PCP - General (Internal Medicine)  Extended Emergency Contact Information Primary Emergency Contact: Coble,Dean Address: [address] [place], [place] [address] [place] Phone: [phone] Relation: Son Secondary Emergency Contact: Jolaine Click Address: [address] [place], [place] [address] Home Phone: [phone] Work Phone: [phone] Relation: None  Code Status:  DNR Goals of care: Advanced Directive information Advanced Directives 12/19/2016  Does Patient Have a Medical Advance Directive? Yes  Type of Advance Directive Out of facility DNR (pink MOST or yellow form)  Does patient want to make changes to medical advance directive? No - Patient declined  Pre-existing out of facility DNR order (yellow form or pink MOST form) -     Chief Complaint  Patient presents with  . Medical Management of Chronic Issues    Routine Visit    HPI:  Pt is a 83 y.o. female seen today for medical management of chronic diseases.  Patient has been increasingly unstable of the past month.  She had began to have increasing angry outbursts, elopement and combativeness.  She seems to have declined more over this past month.  No longer an elopement risk.  Sleeping more.  Having increased confusion, forgetfulness.  Staff reports times of having to feed her meals-now at least one meal per day.  Dose of Depakote was increased several months ago which seems to have helped.  Other medication adjustments made.  Transitioned from Seroquel to risperidone.  Topamax being reduced to decrease polypharmacy.  Patient appears to be tolerant of this.  Being followed by palliative care.  Patient does not yet meet hospice criteria.  Vital signs stable.  No other complaints.  Past Medical History:  Diagnosis Date  . A-fib  ([place])   . Chronic CHF (congestive heart failure) ([place])   . Diabetes mellitus with stage 2 chronic kidney disease ([place])   . Hx of transient ischemic attack (TIA)   . Hyperlipidemia   . Hypertension   . Varicose veins    Past Surgical History:  Procedure Laterality Date  . APPENDECTOMY    . CHOLECYSTECTOMY    . CORONARY ARTERY BYPASS GRAFT N/A 07/27/2013   Procedure: CORONARY ARTERY BYPASS GRAFTING (CABG) POSS. MITRAL REPAIR;  Surgeon: [name], MD;  Location: [facility];  Service: Open Heart Surgery;  Laterality: N/A;  Coronary artery bypass graft times four using left internal mammary artery and right saphenous leg vein using endoscope.  Marland Kitchen KNEE SURGERY      No Known Allergies  Allergies as of 12/19/2016   No Known Allergies     Medication List       Accurate as of 12/19/16 11:59 PM. Always use your most recent med list.          acetaminophen 325 MG tablet Commonly known as:  TYLENOL Take 650 mg by mouth every 4 (four) hours as needed for fever. May administer orally, per G-tube if needed or rectally if unable to swallow. Maximum dose for 24 hours is 3000 mg from all sources of Acetaminophen /Tylenol   acetaminophen 325 MG tablet Commonly known as:  TYLENOL Take 650 mg by mouth 3 (three) times daily.   ASPERCREME W/LIDOCAINE 4 % cream Generic drug:  lidocaine  Apply 1 application topically 3 (three) times daily. Apply thin layer to bilateral knees   aspirin 81 MG chewable tablet Chew 1 tablet (81 mg total) by mouth daily.   Cholecalciferol 2000 units Caps Take 1 capsule by mouth daily.   cyanocobalamin 1000 MCG tablet Take 1,000 mcg by mouth daily.   divalproex 500 MG DR tablet Commonly known as:  DEPAKOTE Take 500 mg by mouth 2 (two) times daily.   ELIQUIS 2.5 MG Tabs tablet Generic drug:  apixaban Take 2.5 mg by mouth 2 (two) times daily.   LORazepam 0.5 MG tablet Commonly known as:  ATIVAN Take 0.25 mg by mouth daily. 1/2 tablet (0.25 mg), ok to  hold for sedation. Attempting GDR   Magnesium Oxide 200 MG Tabs Take 200 mg by mouth daily.   Melatonin 3 MG Tabs Take 3 mg by mouth at bedtime.   polyethylene glycol packet Commonly known as:  MIRALAX / GLYCOLAX Take 17 g by mouth daily.   potassium chloride SA 20 MEQ tablet Commonly known as:  K-DUR,KLOR-CON Take 20 mEq by mouth 2 (two) times daily.   PRESERVISION AREDS Tabs Take 1 tablet by mouth daily.   risperiDONE 0.5 MG tablet Commonly known as:  RISPERDAL Take 0.25-0.5 mg by mouth 2 (two) times daily. Give 1/2 (0.25 mg) tablet in morning and 1 (0.5 mg) tablet in the evening for psychosis.   SENNA-PLUS 8.6-50 MG tablet Generic drug:  senna-docusate Take 1 tablet by mouth 2 (two) times daily.   topiramate 25 MG tablet Commonly known as:  TOPAMAX Take 15 mg by mouth 2 (two) times daily. 1/2 tab   torsemide 20 MG tablet Commonly known as:  DEMADEX Take 30 mg by mouth daily. 1 and 1/2 tablet   UTI-STAT Liqd Take 30 mLs by mouth daily.       Review of Systems  Unable to perform ROS: Dementia  Constitutional: Negative for activity change, appetite change, chills, diaphoresis and fever.  HENT: Negative.  Negative for congestion, sneezing, sore throat, trouble swallowing and voice change.   Eyes: Negative for pain, redness and visual disturbance.  Respiratory: Negative for apnea, cough, choking, chest tightness, shortness of breath and wheezing.   Cardiovascular: Negative for chest pain, palpitations and leg swelling.  Gastrointestinal: Negative for abdominal distention, abdominal pain, constipation, diarrhea and nausea.  Genitourinary: Negative for difficulty urinating, dysuria, frequency and urgency.  Musculoskeletal: Positive for arthralgias (typical arthritis). Negative for back pain, gait problem and myalgias.  Skin: Negative for color change, pallor, rash and wound.  Neurological: Negative for dizziness, tremors, syncope, speech difficulty, weakness, numbness  and headaches.  Psychiatric/Behavioral: Positive for agitation, behavioral problems and confusion.  All other systems reviewed and are negative.   Immunization History  Administered Date(s) Administered  . Influenza-Unspecified 11/07/2013, 11/17/2014, 11/11/2015, 11/15/2016  . PPD Test 11/24/2014, 11/27/2015  . Pneumococcal-Unspecified 01/21/2014   Pertinent  Health Maintenance Due  Topic Date Due  . FOOT EXAM  10/06/1938  . OPHTHALMOLOGY EXAM  10/06/1938  . URINE MICROALBUMIN  10/06/1938  . DEXA SCAN  10/07/1995  . HEMOGLOBIN A1C  01/26/2014  . PNA vac Low Risk Adult (2 of 2 - PCV13) 01/22/2015  . INFLUENZA VACCINE  Completed   No flowsheet data found. Functional Status Survey:    Vitals:   12/19/16 1412  BP: (!) 96/54  Pulse: 70  Resp: 18  Temp: (!) 97.5 F (36.4 C)  SpO2: 100%  Weight: 213 lb 11.2 oz (96.9 kg)  Height: 5\' 7"  (1.702  m)   Body mass index is 33.47 kg/m. Physical Exam  Constitutional: She is oriented to person, place, and time. Vital signs are normal. She appears well-developed and well-nourished. She is active and cooperative. She does not appear ill. No distress.  HENT:  Head: Normocephalic and atraumatic.  Mouth/Throat: Uvula is midline, oropharynx is clear and moist and mucous membranes are normal. Mucous membranes are not pale, not dry and not cyanotic.  Eyes: Pupils are equal, round, and reactive to light. Conjunctivae, EOM and lids are normal.  Neck: Trachea normal, normal range of motion and full passive range of motion without pain. Neck supple. No JVD present. No tracheal deviation, no edema and no erythema present. No thyromegaly present.  Cardiovascular: Normal rate, regular rhythm, normal heart sounds, intact distal pulses and normal pulses.  Exam reveals no gallop, no distant heart sounds and no friction rub.   No murmur heard. Pulses:      Dorsalis pedis pulses are 2+ on the right side, and 2+ on the left side.  Mild BLE edema    Pulmonary/Chest: Effort normal and breath sounds normal. No accessory muscle usage. No respiratory distress. She has no decreased breath sounds. She has no wheezes. She has no rhonchi. She has no rales. She exhibits no tenderness.  Abdominal: Normal appearance and bowel sounds are normal. She exhibits no distension and no ascites. There is no tenderness.  Musculoskeletal: Normal range of motion. She exhibits no edema or tenderness.  Expected osteoarthritis, stiffness  Neurological: She is alert and oriented to person, place, and time. She has normal strength.  Skin: Skin is warm, dry and intact. No rash noted. She is not diaphoretic. No cyanosis or erythema. No pallor. Nails show no clubbing.  See description in the HPI  Psychiatric: Her speech is normal. Her affect is labile. She is agitated. Thought content is paranoid and delusional. Cognition and memory are impaired. She expresses impulsivity and inappropriate judgment. She exhibits abnormal recent memory and abnormal remote memory.  Nursing note and vitals reviewed.   Labs reviewed:  Recent Labs  04/27/16 0640 11/01/16 0423 11/14/16 0655 12/06/16 0652  NA 140 147* 137 139  K 3.9 3.2* 3.3* 4.0  CL 109 109 103 104  CO2 23 25 26 26   GLUCOSE 108* 127* 109* 93  BUN 29* 45* 34* 38*  CREATININE 1.60* 1.86* 1.24* 1.44*  CALCIUM 8.7* 9.3 8.8* 8.8*  MG 2.4  --   --   --     Recent Labs  11/01/16 0423 11/14/16 0655 12/06/16 0652  AST 21 20 16   ALT 16 16 12*  ALKPHOS 82 58 49  BILITOT 0.8 0.7 0.5  PROT 6.9 5.9* 5.6*  ALBUMIN 3.6 3.2* 2.8*    Recent Labs  04/27/16 0640 11/01/16 0423  WBC 6.6 5.3  NEUTROABS 4.2 2.9  HGB 10.7* 13.9  HCT 31.6* 42.4  MCV 90.1 97.4  PLT 144* 147*   Lab Results  Component Value Date   TSH 2.774 04/27/2016   Lab Results  Component Value Date   HGBA1C 6.0 (H) 07/26/2013   Lab Results  Component Value Date   CHOL 142 04/27/2016   HDL 56 04/27/2016   LDLCALC 70 04/27/2016   TRIG 81  04/27/2016   CHOLHDL 2.5 04/27/2016    Significant Diagnostic Results in last 30 days:  No results found.  Assessment/Plan 1. Other Alzheimer's disease  Variable  Progressive   2. 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 Unspecified psychosis not due to a substance or known physiological condition  Variable  Continue Depakote sprinkles 500 mg po BID  Continue Cholecalciferol 2,000 units po Q Day  Risperdol 0.5 mg po BID- 1/2 tablet in the AM, 1 tablet in the PM  Melatonin 3 mg p.o. daily at 4 PM  Lorazepam 0.25 mg p.o. daily at 4 PM for evening agitation  4. Mood disorder (HCC)  Variable  Increase Depakote sprinkles 500 mg po BID  Continue Cholecalciferol 2,000 units po Q Day  Risperdol 0.5 mg po BID- 1/2 tablet in the AM, 1 tablet in the PM  5. Type 2 diabetes mellitus with stage 2 chronic kidney disease, without long-term current use of insulin (HCC)  Stable   6. Hypertensive heart disease with heart failure (HCC)  Stable  Continue Lisinopril 2.5 mg po Q Day  Continue ASA 81 mg po Q Day  7. Chronic systolic congestive heart failure (HCC)  Stable  Increase Torsemide 30 mg po Q Day  8. Presence of coronary artery bypass graft stent  Stable   9. Bradycardia  Stable   10. Deficiency of other specified B group vitamins (CODE)  Stable  Continue Cyanocobalamin 1,000 mcg po Q Day  11. Old myocardial infarction  Stable   12. Atrial fibrillation, unspecified type (HCC)  Stable  Continue Eliquis 2.5 mg po BID  13. Hyperlipidemia, unspecified hyperlipidemia type  Stable  14. Hypomagnesemia  Stable  Decrease Magnesium Oxide 200 mg po Q day  15. Hypokalemia  Stable  Increase Potassium Chloride 20 meq po BID  16. Personal history of transient ischemic attack (TIA), and cerebral infarction without residual deficits  Stable   17. Migraine without status migrainosus,  not intractable, unspecified migraine type  Stable  Continue Topamax 25 mg- 1/2 tablet po BID  18. Osteoarthritis  Continue Tylenol 650 mg po TID  Continue Voltaren 1% gel- 4 grams to both knees QID   Family/ staff Communication:   Total Time:  Documentation:  Face to Face:  Family/Phone:   Labs/tests ordered:  Not due  Medication list reviewed and assessed for continued appropriateness. Monthly medication orders reviewed and signed.  Vikki Ports, NP-C Geriatrics [facility] [phone], [place] [address] Cell Phone (Mon-Fri 8am-5pm):  [phone] On Call:  [phone] & follow prompts after 5pm & weekends Office Phone:  [phone] Office Fax:  [phone]

## 2016-12-28 ENCOUNTER — Encounter
Admission: RE | Admit: 2016-12-28 | Discharge: 2016-12-28 | Disposition: A | Discharge status: Home / Self Care | Payer: Medicare Other | Source: Ambulatory Visit | Attending: Internal Medicine | Admitting: Internal Medicine

## 2017-01-02 ENCOUNTER — Other Ambulatory Visit
Admission: RE | Admit: 2017-01-02 | Discharge: 2017-01-02 | Disposition: A | Discharge status: Home / Self Care | Payer: Medicare Other | Source: Ambulatory Visit | Attending: Gerontology | Admitting: Gerontology

## 2017-01-02 DIAGNOSIS — I130 Hypertensive heart and chronic kidney disease with heart failure and stage 1 through stage 4 chronic kidney disease, or unspecified chronic kidney disease: Secondary | ICD-10-CM | POA: Insufficient documentation

## 2017-01-02 LAB — COMPREHENSIVE METABOLIC PANEL
ALBUMIN: 2.9 g/dL — AB (ref 3.5–5.0)
ALT: 11 U/L — ABNORMAL LOW (ref 14–54)
ANION GAP: 12 (ref 5–15)
AST: 15 U/L (ref 15–41)
Alkaline Phosphatase: 57 U/L (ref 38–126)
BILIRUBIN TOTAL: 0.8 mg/dL (ref 0.3–1.2)
BUN: 31 mg/dL — ABNORMAL HIGH (ref 6–20)
CO2: 26 mmol/L (ref 22–32)
Calcium: 8.6 mg/dL — ABNORMAL LOW (ref 8.9–10.3)
Chloride: 100 mmol/L — ABNORMAL LOW (ref 101–111)
Creatinine, Ser: 1.32 mg/dL — ABNORMAL HIGH (ref 0.44–1.00)
GFR, EST AFRICAN AMERICAN: 41 mL/min — AB (ref >60)
GFR, EST NON AFRICAN AMERICAN: 35 mL/min — AB (ref >60)
Glucose, Bld: 92 mg/dL (ref 65–99)
POTASSIUM: 2.7 mmol/L — AB (ref 3.5–5.1)
Sodium: 138 mmol/L (ref 135–145)
TOTAL PROTEIN: 5.7 g/dL — AB (ref 6.5–8.1)

## 2017-01-03 ENCOUNTER — Other Ambulatory Visit
Admission: RE | Admit: 2017-01-03 | Discharge: 2017-01-03 | Disposition: A | Discharge status: Home / Self Care | Payer: Medicare Other | Source: Ambulatory Visit | Attending: Gerontology | Admitting: Gerontology

## 2017-01-03 DIAGNOSIS — E871 Hypo-osmolality and hyponatremia: Secondary | ICD-10-CM | POA: Diagnosis not present

## 2017-01-03 LAB — POTASSIUM: POTASSIUM: 2.9 mmol/L — AB (ref 3.5–5.1)

## 2017-01-04 ENCOUNTER — Other Ambulatory Visit
Admission: RE | Admit: 2017-01-04 | Discharge: 2017-01-04 | Disposition: A | Discharge status: Home / Self Care | Payer: Medicare Other | Source: Ambulatory Visit | Attending: Gerontology | Admitting: Gerontology

## 2017-01-04 DIAGNOSIS — E876 Hypokalemia: Secondary | ICD-10-CM | POA: Diagnosis not present

## 2017-01-04 LAB — POTASSIUM: POTASSIUM: 2.7 mmol/L — AB (ref 3.5–5.1)

## 2017-01-05 ENCOUNTER — Other Ambulatory Visit
Admission: RE | Admit: 2017-01-05 | Discharge: 2017-01-05 | Disposition: A | Discharge status: Home / Self Care | Payer: Medicare Other | Source: Other Acute Inpatient Hospital | Attending: Internal Medicine | Admitting: Internal Medicine

## 2017-01-05 DIAGNOSIS — Z7901 Long term (current) use of anticoagulants: Secondary | ICD-10-CM | POA: Diagnosis not present

## 2017-01-05 DIAGNOSIS — F0281 Dementia in other diseases classified elsewhere with behavioral disturbance: Secondary | ICD-10-CM | POA: Diagnosis not present

## 2017-01-05 DIAGNOSIS — Z7982 Long term (current) use of aspirin: Secondary | ICD-10-CM | POA: Diagnosis not present

## 2017-01-05 DIAGNOSIS — R634 Abnormal weight loss: Secondary | ICD-10-CM | POA: Diagnosis not present

## 2017-01-05 DIAGNOSIS — N182 Chronic kidney disease, stage 2 (mild): Secondary | ICD-10-CM | POA: Diagnosis not present

## 2017-01-05 DIAGNOSIS — G309 Alzheimer's disease, unspecified: Secondary | ICD-10-CM | POA: Diagnosis not present

## 2017-01-05 DIAGNOSIS — E876 Hypokalemia: Secondary | ICD-10-CM | POA: Insufficient documentation

## 2017-01-05 DIAGNOSIS — I252 Old myocardial infarction: Secondary | ICD-10-CM | POA: Diagnosis not present

## 2017-01-05 DIAGNOSIS — R627 Adult failure to thrive: Secondary | ICD-10-CM | POA: Diagnosis not present

## 2017-01-05 DIAGNOSIS — E785 Hyperlipidemia, unspecified: Secondary | ICD-10-CM | POA: Diagnosis not present

## 2017-01-05 DIAGNOSIS — I5022 Chronic systolic (congestive) heart failure: Secondary | ICD-10-CM | POA: Diagnosis not present

## 2017-01-05 DIAGNOSIS — Z951 Presence of aortocoronary bypass graft: Secondary | ICD-10-CM | POA: Diagnosis not present

## 2017-01-05 DIAGNOSIS — E1122 Type 2 diabetes mellitus with diabetic chronic kidney disease: Secondary | ICD-10-CM | POA: Diagnosis not present

## 2017-01-05 DIAGNOSIS — E538 Deficiency of other specified B group vitamins: Secondary | ICD-10-CM | POA: Diagnosis not present

## 2017-01-05 DIAGNOSIS — E46 Unspecified protein-calorie malnutrition: Secondary | ICD-10-CM | POA: Diagnosis not present

## 2017-01-05 DIAGNOSIS — I4891 Unspecified atrial fibrillation: Secondary | ICD-10-CM | POA: Diagnosis not present

## 2017-01-05 DIAGNOSIS — I2510 Atherosclerotic heart disease of native coronary artery without angina pectoris: Secondary | ICD-10-CM | POA: Diagnosis not present

## 2017-01-05 LAB — POTASSIUM: POTASSIUM: 3.7 mmol/L (ref 3.5–5.1)

## 2017-01-08 DIAGNOSIS — F0281 Dementia in other diseases classified elsewhere with behavioral disturbance: Secondary | ICD-10-CM | POA: Diagnosis not present

## 2017-01-08 DIAGNOSIS — I252 Old myocardial infarction: Secondary | ICD-10-CM | POA: Diagnosis not present

## 2017-01-08 DIAGNOSIS — G309 Alzheimer's disease, unspecified: Secondary | ICD-10-CM | POA: Diagnosis not present

## 2017-01-08 DIAGNOSIS — I5022 Chronic systolic (congestive) heart failure: Secondary | ICD-10-CM | POA: Diagnosis not present

## 2017-01-08 DIAGNOSIS — I2510 Atherosclerotic heart disease of native coronary artery without angina pectoris: Secondary | ICD-10-CM | POA: Diagnosis not present

## 2017-01-08 DIAGNOSIS — I4891 Unspecified atrial fibrillation: Secondary | ICD-10-CM | POA: Diagnosis not present

## 2017-01-09 DIAGNOSIS — I4891 Unspecified atrial fibrillation: Secondary | ICD-10-CM | POA: Diagnosis not present

## 2017-01-09 DIAGNOSIS — I252 Old myocardial infarction: Secondary | ICD-10-CM | POA: Diagnosis not present

## 2017-01-09 DIAGNOSIS — I5022 Chronic systolic (congestive) heart failure: Secondary | ICD-10-CM | POA: Diagnosis not present

## 2017-01-09 DIAGNOSIS — F0281 Dementia in other diseases classified elsewhere with behavioral disturbance: Secondary | ICD-10-CM | POA: Diagnosis not present

## 2017-01-09 DIAGNOSIS — G309 Alzheimer's disease, unspecified: Secondary | ICD-10-CM | POA: Diagnosis not present

## 2017-01-09 DIAGNOSIS — I2510 Atherosclerotic heart disease of native coronary artery without angina pectoris: Secondary | ICD-10-CM | POA: Diagnosis not present

## 2017-01-10 DIAGNOSIS — I5022 Chronic systolic (congestive) heart failure: Secondary | ICD-10-CM | POA: Diagnosis not present

## 2017-01-10 DIAGNOSIS — I252 Old myocardial infarction: Secondary | ICD-10-CM | POA: Diagnosis not present

## 2017-01-10 DIAGNOSIS — F0281 Dementia in other diseases classified elsewhere with behavioral disturbance: Secondary | ICD-10-CM | POA: Diagnosis not present

## 2017-01-10 DIAGNOSIS — I2510 Atherosclerotic heart disease of native coronary artery without angina pectoris: Secondary | ICD-10-CM | POA: Diagnosis not present

## 2017-01-10 DIAGNOSIS — I4891 Unspecified atrial fibrillation: Secondary | ICD-10-CM | POA: Diagnosis not present

## 2017-01-10 DIAGNOSIS — G309 Alzheimer's disease, unspecified: Secondary | ICD-10-CM | POA: Diagnosis not present

## 2017-01-11 DIAGNOSIS — Z0000 Encounter for general adult medical examination without abnormal findings: Secondary | ICD-10-CM | POA: Diagnosis not present

## 2017-01-11 DIAGNOSIS — F0390 Unspecified dementia without behavioral disturbance: Secondary | ICD-10-CM | POA: Diagnosis not present

## 2017-01-11 DIAGNOSIS — I5020 Unspecified systolic (congestive) heart failure: Secondary | ICD-10-CM | POA: Diagnosis not present

## 2017-01-11 DIAGNOSIS — I5022 Chronic systolic (congestive) heart failure: Secondary | ICD-10-CM | POA: Diagnosis not present

## 2017-01-11 DIAGNOSIS — I2510 Atherosclerotic heart disease of native coronary artery without angina pectoris: Secondary | ICD-10-CM | POA: Diagnosis not present

## 2017-01-11 DIAGNOSIS — I4891 Unspecified atrial fibrillation: Secondary | ICD-10-CM | POA: Diagnosis not present

## 2017-01-11 DIAGNOSIS — E1122 Type 2 diabetes mellitus with diabetic chronic kidney disease: Secondary | ICD-10-CM | POA: Diagnosis not present

## 2017-01-11 DIAGNOSIS — I252 Old myocardial infarction: Secondary | ICD-10-CM | POA: Diagnosis not present

## 2017-01-11 DIAGNOSIS — F0281 Dementia in other diseases classified elsewhere with behavioral disturbance: Secondary | ICD-10-CM | POA: Diagnosis not present

## 2017-01-11 DIAGNOSIS — I482 Chronic atrial fibrillation: Secondary | ICD-10-CM | POA: Diagnosis not present

## 2017-01-11 DIAGNOSIS — G309 Alzheimer's disease, unspecified: Secondary | ICD-10-CM | POA: Diagnosis not present

## 2017-01-11 DIAGNOSIS — N182 Chronic kidney disease, stage 2 (mild): Secondary | ICD-10-CM | POA: Diagnosis not present

## 2017-01-12 DIAGNOSIS — F0281 Dementia in other diseases classified elsewhere with behavioral disturbance: Secondary | ICD-10-CM | POA: Diagnosis not present

## 2017-01-12 DIAGNOSIS — I252 Old myocardial infarction: Secondary | ICD-10-CM | POA: Diagnosis not present

## 2017-01-12 DIAGNOSIS — I4891 Unspecified atrial fibrillation: Secondary | ICD-10-CM | POA: Diagnosis not present

## 2017-01-12 DIAGNOSIS — I2510 Atherosclerotic heart disease of native coronary artery without angina pectoris: Secondary | ICD-10-CM | POA: Diagnosis not present

## 2017-01-12 DIAGNOSIS — G309 Alzheimer's disease, unspecified: Secondary | ICD-10-CM | POA: Diagnosis not present

## 2017-01-12 DIAGNOSIS — I5022 Chronic systolic (congestive) heart failure: Secondary | ICD-10-CM | POA: Diagnosis not present

## 2017-01-15 DIAGNOSIS — I2510 Atherosclerotic heart disease of native coronary artery without angina pectoris: Secondary | ICD-10-CM | POA: Diagnosis not present

## 2017-01-15 DIAGNOSIS — I252 Old myocardial infarction: Secondary | ICD-10-CM | POA: Diagnosis not present

## 2017-01-15 DIAGNOSIS — I4891 Unspecified atrial fibrillation: Secondary | ICD-10-CM | POA: Diagnosis not present

## 2017-01-15 DIAGNOSIS — G309 Alzheimer's disease, unspecified: Secondary | ICD-10-CM | POA: Diagnosis not present

## 2017-01-15 DIAGNOSIS — I5022 Chronic systolic (congestive) heart failure: Secondary | ICD-10-CM | POA: Diagnosis not present

## 2017-01-15 DIAGNOSIS — F0281 Dementia in other diseases classified elsewhere with behavioral disturbance: Secondary | ICD-10-CM | POA: Diagnosis not present

## 2017-01-16 ENCOUNTER — Non-Acute Institutional Stay (SKILLED_NURSING_FACILITY): Payer: Medicare Other | Admitting: Gerontology

## 2017-01-16 ENCOUNTER — Encounter: Payer: Self-pay | Admitting: Gerontology

## 2017-01-16 DIAGNOSIS — F39 Unspecified mood [affective] disorder: Secondary | ICD-10-CM

## 2017-01-16 DIAGNOSIS — I4891 Unspecified atrial fibrillation: Secondary | ICD-10-CM | POA: Diagnosis not present

## 2017-01-16 DIAGNOSIS — I252 Old myocardial infarction: Secondary | ICD-10-CM | POA: Diagnosis not present

## 2017-01-16 DIAGNOSIS — I5022 Chronic systolic (congestive) heart failure: Secondary | ICD-10-CM

## 2017-01-16 DIAGNOSIS — G308 Other Alzheimer's disease: Secondary | ICD-10-CM | POA: Diagnosis not present

## 2017-01-16 DIAGNOSIS — G309 Alzheimer's disease, unspecified: Secondary | ICD-10-CM | POA: Diagnosis not present

## 2017-01-16 DIAGNOSIS — E785 Hyperlipidemia, unspecified: Secondary | ICD-10-CM

## 2017-01-16 DIAGNOSIS — E1122 Type 2 diabetes mellitus with diabetic chronic kidney disease: Secondary | ICD-10-CM

## 2017-01-16 DIAGNOSIS — R001 Bradycardia, unspecified: Secondary | ICD-10-CM

## 2017-01-16 DIAGNOSIS — F29 Unspecified psychosis not due to a substance or known physiological condition: Secondary | ICD-10-CM | POA: Diagnosis not present

## 2017-01-16 DIAGNOSIS — E538 Deficiency of other specified B group vitamins: Secondary | ICD-10-CM

## 2017-01-16 DIAGNOSIS — F0281 Dementia in other diseases classified elsewhere with behavioral disturbance: Secondary | ICD-10-CM

## 2017-01-16 DIAGNOSIS — F0280 Dementia in other diseases classified elsewhere without behavioral disturbance: Secondary | ICD-10-CM

## 2017-01-16 DIAGNOSIS — I2510 Atherosclerotic heart disease of native coronary artery without angina pectoris: Secondary | ICD-10-CM | POA: Diagnosis not present

## 2017-01-16 DIAGNOSIS — M1990 Unspecified osteoarthritis, unspecified site: Secondary | ICD-10-CM

## 2017-01-16 DIAGNOSIS — I110 Hypertensive heart disease with heart failure: Secondary | ICD-10-CM | POA: Diagnosis not present

## 2017-01-16 DIAGNOSIS — Z8673 Personal history of transient ischemic attack (TIA), and cerebral infarction without residual deficits: Secondary | ICD-10-CM

## 2017-01-16 DIAGNOSIS — Z951 Presence of aortocoronary bypass graft: Secondary | ICD-10-CM

## 2017-01-16 DIAGNOSIS — N182 Chronic kidney disease, stage 2 (mild): Secondary | ICD-10-CM

## 2017-01-16 DIAGNOSIS — Z955 Presence of coronary angioplasty implant and graft: Secondary | ICD-10-CM | POA: Diagnosis not present

## 2017-01-16 DIAGNOSIS — E876 Hypokalemia: Secondary | ICD-10-CM

## 2017-01-16 DIAGNOSIS — G43909 Migraine, unspecified, not intractable, without status migrainosus: Secondary | ICD-10-CM

## 2017-01-17 DIAGNOSIS — G309 Alzheimer's disease, unspecified: Secondary | ICD-10-CM | POA: Diagnosis not present

## 2017-01-17 DIAGNOSIS — I4891 Unspecified atrial fibrillation: Secondary | ICD-10-CM | POA: Diagnosis not present

## 2017-01-17 DIAGNOSIS — I2510 Atherosclerotic heart disease of native coronary artery without angina pectoris: Secondary | ICD-10-CM | POA: Diagnosis not present

## 2017-01-17 DIAGNOSIS — F0281 Dementia in other diseases classified elsewhere with behavioral disturbance: Secondary | ICD-10-CM | POA: Diagnosis not present

## 2017-01-17 DIAGNOSIS — I252 Old myocardial infarction: Secondary | ICD-10-CM | POA: Diagnosis not present

## 2017-01-17 DIAGNOSIS — I5022 Chronic systolic (congestive) heart failure: Secondary | ICD-10-CM | POA: Diagnosis not present

## 2017-01-18 DIAGNOSIS — I4891 Unspecified atrial fibrillation: Secondary | ICD-10-CM | POA: Diagnosis not present

## 2017-01-18 DIAGNOSIS — I5022 Chronic systolic (congestive) heart failure: Secondary | ICD-10-CM | POA: Diagnosis not present

## 2017-01-18 DIAGNOSIS — I2510 Atherosclerotic heart disease of native coronary artery without angina pectoris: Secondary | ICD-10-CM | POA: Diagnosis not present

## 2017-01-18 DIAGNOSIS — I252 Old myocardial infarction: Secondary | ICD-10-CM | POA: Diagnosis not present

## 2017-01-18 DIAGNOSIS — F0281 Dementia in other diseases classified elsewhere with behavioral disturbance: Secondary | ICD-10-CM | POA: Diagnosis not present

## 2017-01-18 DIAGNOSIS — G309 Alzheimer's disease, unspecified: Secondary | ICD-10-CM | POA: Diagnosis not present

## 2017-01-19 DIAGNOSIS — I4891 Unspecified atrial fibrillation: Secondary | ICD-10-CM | POA: Diagnosis not present

## 2017-01-19 DIAGNOSIS — I5022 Chronic systolic (congestive) heart failure: Secondary | ICD-10-CM | POA: Diagnosis not present

## 2017-01-19 DIAGNOSIS — I252 Old myocardial infarction: Secondary | ICD-10-CM | POA: Diagnosis not present

## 2017-01-19 DIAGNOSIS — F0281 Dementia in other diseases classified elsewhere with behavioral disturbance: Secondary | ICD-10-CM | POA: Diagnosis not present

## 2017-01-19 DIAGNOSIS — I2510 Atherosclerotic heart disease of native coronary artery without angina pectoris: Secondary | ICD-10-CM | POA: Diagnosis not present

## 2017-01-19 DIAGNOSIS — G309 Alzheimer's disease, unspecified: Secondary | ICD-10-CM | POA: Diagnosis not present

## 2017-01-22 DIAGNOSIS — I252 Old myocardial infarction: Secondary | ICD-10-CM | POA: Diagnosis not present

## 2017-01-22 DIAGNOSIS — G309 Alzheimer's disease, unspecified: Secondary | ICD-10-CM | POA: Diagnosis not present

## 2017-01-22 DIAGNOSIS — I2510 Atherosclerotic heart disease of native coronary artery without angina pectoris: Secondary | ICD-10-CM | POA: Diagnosis not present

## 2017-01-22 DIAGNOSIS — F0281 Dementia in other diseases classified elsewhere with behavioral disturbance: Secondary | ICD-10-CM | POA: Diagnosis not present

## 2017-01-22 DIAGNOSIS — I4891 Unspecified atrial fibrillation: Secondary | ICD-10-CM | POA: Diagnosis not present

## 2017-01-22 DIAGNOSIS — M79622 Pain in left upper arm: Secondary | ICD-10-CM | POA: Diagnosis not present

## 2017-01-22 DIAGNOSIS — I5022 Chronic systolic (congestive) heart failure: Secondary | ICD-10-CM | POA: Diagnosis not present

## 2017-01-22 DIAGNOSIS — M25512 Pain in left shoulder: Secondary | ICD-10-CM | POA: Diagnosis not present

## 2017-01-23 DIAGNOSIS — G309 Alzheimer's disease, unspecified: Secondary | ICD-10-CM | POA: Diagnosis not present

## 2017-01-23 DIAGNOSIS — I252 Old myocardial infarction: Secondary | ICD-10-CM | POA: Diagnosis not present

## 2017-01-23 DIAGNOSIS — I4891 Unspecified atrial fibrillation: Secondary | ICD-10-CM | POA: Diagnosis not present

## 2017-01-23 DIAGNOSIS — I5022 Chronic systolic (congestive) heart failure: Secondary | ICD-10-CM | POA: Diagnosis not present

## 2017-01-23 DIAGNOSIS — I2510 Atherosclerotic heart disease of native coronary artery without angina pectoris: Secondary | ICD-10-CM | POA: Diagnosis not present

## 2017-01-23 DIAGNOSIS — F0281 Dementia in other diseases classified elsewhere with behavioral disturbance: Secondary | ICD-10-CM | POA: Diagnosis not present

## 2017-01-24 DIAGNOSIS — I5022 Chronic systolic (congestive) heart failure: Secondary | ICD-10-CM | POA: Diagnosis not present

## 2017-01-24 DIAGNOSIS — I2510 Atherosclerotic heart disease of native coronary artery without angina pectoris: Secondary | ICD-10-CM | POA: Diagnosis not present

## 2017-01-24 DIAGNOSIS — I252 Old myocardial infarction: Secondary | ICD-10-CM | POA: Diagnosis not present

## 2017-01-24 DIAGNOSIS — F0281 Dementia in other diseases classified elsewhere with behavioral disturbance: Secondary | ICD-10-CM | POA: Diagnosis not present

## 2017-01-24 DIAGNOSIS — G309 Alzheimer's disease, unspecified: Secondary | ICD-10-CM | POA: Diagnosis not present

## 2017-01-24 DIAGNOSIS — I4891 Unspecified atrial fibrillation: Secondary | ICD-10-CM | POA: Diagnosis not present

## 2017-01-25 DIAGNOSIS — F0281 Dementia in other diseases classified elsewhere with behavioral disturbance: Secondary | ICD-10-CM | POA: Diagnosis not present

## 2017-01-25 DIAGNOSIS — I4891 Unspecified atrial fibrillation: Secondary | ICD-10-CM | POA: Diagnosis not present

## 2017-01-25 DIAGNOSIS — I2510 Atherosclerotic heart disease of native coronary artery without angina pectoris: Secondary | ICD-10-CM | POA: Diagnosis not present

## 2017-01-25 DIAGNOSIS — G309 Alzheimer's disease, unspecified: Secondary | ICD-10-CM | POA: Diagnosis not present

## 2017-01-25 DIAGNOSIS — I252 Old myocardial infarction: Secondary | ICD-10-CM | POA: Diagnosis not present

## 2017-01-25 DIAGNOSIS — I5022 Chronic systolic (congestive) heart failure: Secondary | ICD-10-CM | POA: Diagnosis not present

## 2017-01-26 DIAGNOSIS — I5022 Chronic systolic (congestive) heart failure: Secondary | ICD-10-CM | POA: Diagnosis not present

## 2017-01-26 DIAGNOSIS — G309 Alzheimer's disease, unspecified: Secondary | ICD-10-CM | POA: Diagnosis not present

## 2017-01-26 DIAGNOSIS — F0281 Dementia in other diseases classified elsewhere with behavioral disturbance: Secondary | ICD-10-CM | POA: Diagnosis not present

## 2017-01-26 DIAGNOSIS — I4891 Unspecified atrial fibrillation: Secondary | ICD-10-CM | POA: Diagnosis not present

## 2017-01-26 DIAGNOSIS — I2510 Atherosclerotic heart disease of native coronary artery without angina pectoris: Secondary | ICD-10-CM | POA: Diagnosis not present

## 2017-01-26 DIAGNOSIS — I252 Old myocardial infarction: Secondary | ICD-10-CM | POA: Diagnosis not present

## 2017-01-27 ENCOUNTER — Encounter
Admission: RE | Admit: 2017-01-27 | Discharge: 2017-01-27 | Disposition: A | Discharge status: Home / Self Care | Payer: Medicare Other | Source: Ambulatory Visit | Attending: Internal Medicine | Admitting: Internal Medicine

## 2017-01-27 DIAGNOSIS — E538 Deficiency of other specified B group vitamins: Secondary | ICD-10-CM | POA: Diagnosis not present

## 2017-01-27 DIAGNOSIS — E1122 Type 2 diabetes mellitus with diabetic chronic kidney disease: Secondary | ICD-10-CM | POA: Diagnosis not present

## 2017-01-27 DIAGNOSIS — R627 Adult failure to thrive: Secondary | ICD-10-CM | POA: Diagnosis not present

## 2017-01-27 DIAGNOSIS — R634 Abnormal weight loss: Secondary | ICD-10-CM | POA: Diagnosis not present

## 2017-01-27 DIAGNOSIS — I4891 Unspecified atrial fibrillation: Secondary | ICD-10-CM | POA: Diagnosis not present

## 2017-01-27 DIAGNOSIS — Z7901 Long term (current) use of anticoagulants: Secondary | ICD-10-CM | POA: Diagnosis not present

## 2017-01-27 DIAGNOSIS — Z7982 Long term (current) use of aspirin: Secondary | ICD-10-CM | POA: Diagnosis not present

## 2017-01-27 DIAGNOSIS — I5022 Chronic systolic (congestive) heart failure: Secondary | ICD-10-CM | POA: Diagnosis not present

## 2017-01-27 DIAGNOSIS — Z951 Presence of aortocoronary bypass graft: Secondary | ICD-10-CM | POA: Diagnosis not present

## 2017-01-27 DIAGNOSIS — I252 Old myocardial infarction: Secondary | ICD-10-CM | POA: Diagnosis not present

## 2017-01-27 DIAGNOSIS — E46 Unspecified protein-calorie malnutrition: Secondary | ICD-10-CM | POA: Diagnosis not present

## 2017-01-27 DIAGNOSIS — N182 Chronic kidney disease, stage 2 (mild): Secondary | ICD-10-CM | POA: Diagnosis not present

## 2017-01-27 DIAGNOSIS — G309 Alzheimer's disease, unspecified: Secondary | ICD-10-CM | POA: Diagnosis not present

## 2017-01-27 DIAGNOSIS — I2510 Atherosclerotic heart disease of native coronary artery without angina pectoris: Secondary | ICD-10-CM | POA: Diagnosis not present

## 2017-01-27 DIAGNOSIS — E785 Hyperlipidemia, unspecified: Secondary | ICD-10-CM | POA: Diagnosis not present

## 2017-01-27 DIAGNOSIS — F0281 Dementia in other diseases classified elsewhere with behavioral disturbance: Secondary | ICD-10-CM | POA: Diagnosis not present

## 2017-01-29 DIAGNOSIS — I5022 Chronic systolic (congestive) heart failure: Secondary | ICD-10-CM | POA: Diagnosis not present

## 2017-01-29 DIAGNOSIS — I2510 Atherosclerotic heart disease of native coronary artery without angina pectoris: Secondary | ICD-10-CM | POA: Diagnosis not present

## 2017-01-29 DIAGNOSIS — F0281 Dementia in other diseases classified elsewhere with behavioral disturbance: Secondary | ICD-10-CM | POA: Diagnosis not present

## 2017-01-29 DIAGNOSIS — I4891 Unspecified atrial fibrillation: Secondary | ICD-10-CM | POA: Diagnosis not present

## 2017-01-29 DIAGNOSIS — I252 Old myocardial infarction: Secondary | ICD-10-CM | POA: Diagnosis not present

## 2017-01-29 DIAGNOSIS — G309 Alzheimer's disease, unspecified: Secondary | ICD-10-CM | POA: Diagnosis not present

## 2017-01-30 DIAGNOSIS — I4891 Unspecified atrial fibrillation: Secondary | ICD-10-CM | POA: Diagnosis not present

## 2017-01-30 DIAGNOSIS — G309 Alzheimer's disease, unspecified: Secondary | ICD-10-CM | POA: Diagnosis not present

## 2017-01-30 DIAGNOSIS — I252 Old myocardial infarction: Secondary | ICD-10-CM | POA: Diagnosis not present

## 2017-01-30 DIAGNOSIS — I5022 Chronic systolic (congestive) heart failure: Secondary | ICD-10-CM | POA: Diagnosis not present

## 2017-01-30 DIAGNOSIS — I2510 Atherosclerotic heart disease of native coronary artery without angina pectoris: Secondary | ICD-10-CM | POA: Diagnosis not present

## 2017-01-30 DIAGNOSIS — F0281 Dementia in other diseases classified elsewhere with behavioral disturbance: Secondary | ICD-10-CM | POA: Diagnosis not present

## 2017-01-31 DIAGNOSIS — I2510 Atherosclerotic heart disease of native coronary artery without angina pectoris: Secondary | ICD-10-CM | POA: Diagnosis not present

## 2017-01-31 DIAGNOSIS — F0281 Dementia in other diseases classified elsewhere with behavioral disturbance: Secondary | ICD-10-CM | POA: Diagnosis not present

## 2017-01-31 DIAGNOSIS — I252 Old myocardial infarction: Secondary | ICD-10-CM | POA: Diagnosis not present

## 2017-01-31 DIAGNOSIS — G309 Alzheimer's disease, unspecified: Secondary | ICD-10-CM | POA: Diagnosis not present

## 2017-01-31 DIAGNOSIS — I5022 Chronic systolic (congestive) heart failure: Secondary | ICD-10-CM | POA: Diagnosis not present

## 2017-01-31 DIAGNOSIS — I4891 Unspecified atrial fibrillation: Secondary | ICD-10-CM | POA: Diagnosis not present

## 2017-02-01 DIAGNOSIS — G309 Alzheimer's disease, unspecified: Secondary | ICD-10-CM | POA: Diagnosis not present

## 2017-02-01 DIAGNOSIS — I252 Old myocardial infarction: Secondary | ICD-10-CM | POA: Diagnosis not present

## 2017-02-01 DIAGNOSIS — F0281 Dementia in other diseases classified elsewhere with behavioral disturbance: Secondary | ICD-10-CM | POA: Diagnosis not present

## 2017-02-01 DIAGNOSIS — I5022 Chronic systolic (congestive) heart failure: Secondary | ICD-10-CM | POA: Diagnosis not present

## 2017-02-01 DIAGNOSIS — I4891 Unspecified atrial fibrillation: Secondary | ICD-10-CM | POA: Diagnosis not present

## 2017-02-01 DIAGNOSIS — I2510 Atherosclerotic heart disease of native coronary artery without angina pectoris: Secondary | ICD-10-CM | POA: Diagnosis not present

## 2017-02-02 DIAGNOSIS — I4891 Unspecified atrial fibrillation: Secondary | ICD-10-CM | POA: Diagnosis not present

## 2017-02-02 DIAGNOSIS — F0281 Dementia in other diseases classified elsewhere with behavioral disturbance: Secondary | ICD-10-CM | POA: Diagnosis not present

## 2017-02-02 DIAGNOSIS — I2510 Atherosclerotic heart disease of native coronary artery without angina pectoris: Secondary | ICD-10-CM | POA: Diagnosis not present

## 2017-02-02 DIAGNOSIS — I5022 Chronic systolic (congestive) heart failure: Secondary | ICD-10-CM | POA: Diagnosis not present

## 2017-02-02 DIAGNOSIS — G309 Alzheimer's disease, unspecified: Secondary | ICD-10-CM | POA: Diagnosis not present

## 2017-02-02 DIAGNOSIS — I252 Old myocardial infarction: Secondary | ICD-10-CM | POA: Diagnosis not present

## 2017-02-03 DIAGNOSIS — I5022 Chronic systolic (congestive) heart failure: Secondary | ICD-10-CM | POA: Diagnosis not present

## 2017-02-03 DIAGNOSIS — F0281 Dementia in other diseases classified elsewhere with behavioral disturbance: Secondary | ICD-10-CM | POA: Diagnosis not present

## 2017-02-03 DIAGNOSIS — I4891 Unspecified atrial fibrillation: Secondary | ICD-10-CM | POA: Diagnosis not present

## 2017-02-03 DIAGNOSIS — G309 Alzheimer's disease, unspecified: Secondary | ICD-10-CM | POA: Diagnosis not present

## 2017-02-03 DIAGNOSIS — I2510 Atherosclerotic heart disease of native coronary artery without angina pectoris: Secondary | ICD-10-CM | POA: Diagnosis not present

## 2017-02-03 DIAGNOSIS — I252 Old myocardial infarction: Secondary | ICD-10-CM | POA: Diagnosis not present

## 2017-02-06 DIAGNOSIS — G309 Alzheimer's disease, unspecified: Secondary | ICD-10-CM | POA: Diagnosis not present

## 2017-02-06 DIAGNOSIS — I4891 Unspecified atrial fibrillation: Secondary | ICD-10-CM | POA: Diagnosis not present

## 2017-02-06 DIAGNOSIS — I5022 Chronic systolic (congestive) heart failure: Secondary | ICD-10-CM | POA: Diagnosis not present

## 2017-02-06 DIAGNOSIS — I252 Old myocardial infarction: Secondary | ICD-10-CM | POA: Diagnosis not present

## 2017-02-06 DIAGNOSIS — I2510 Atherosclerotic heart disease of native coronary artery without angina pectoris: Secondary | ICD-10-CM | POA: Diagnosis not present

## 2017-02-06 DIAGNOSIS — F0281 Dementia in other diseases classified elsewhere with behavioral disturbance: Secondary | ICD-10-CM | POA: Diagnosis not present

## 2017-02-07 DIAGNOSIS — I4891 Unspecified atrial fibrillation: Secondary | ICD-10-CM | POA: Diagnosis not present

## 2017-02-07 DIAGNOSIS — I5022 Chronic systolic (congestive) heart failure: Secondary | ICD-10-CM | POA: Diagnosis not present

## 2017-02-07 DIAGNOSIS — G309 Alzheimer's disease, unspecified: Secondary | ICD-10-CM | POA: Diagnosis not present

## 2017-02-07 DIAGNOSIS — I2510 Atherosclerotic heart disease of native coronary artery without angina pectoris: Secondary | ICD-10-CM | POA: Diagnosis not present

## 2017-02-07 DIAGNOSIS — F0281 Dementia in other diseases classified elsewhere with behavioral disturbance: Secondary | ICD-10-CM | POA: Diagnosis not present

## 2017-02-07 DIAGNOSIS — I252 Old myocardial infarction: Secondary | ICD-10-CM | POA: Diagnosis not present

## 2017-02-08 DIAGNOSIS — I4891 Unspecified atrial fibrillation: Secondary | ICD-10-CM | POA: Diagnosis not present

## 2017-02-08 DIAGNOSIS — G309 Alzheimer's disease, unspecified: Secondary | ICD-10-CM | POA: Diagnosis not present

## 2017-02-08 DIAGNOSIS — I5022 Chronic systolic (congestive) heart failure: Secondary | ICD-10-CM | POA: Diagnosis not present

## 2017-02-08 DIAGNOSIS — I2510 Atherosclerotic heart disease of native coronary artery without angina pectoris: Secondary | ICD-10-CM | POA: Diagnosis not present

## 2017-02-08 DIAGNOSIS — I252 Old myocardial infarction: Secondary | ICD-10-CM | POA: Diagnosis not present

## 2017-02-08 DIAGNOSIS — F0281 Dementia in other diseases classified elsewhere with behavioral disturbance: Secondary | ICD-10-CM | POA: Diagnosis not present

## 2017-02-09 DIAGNOSIS — G309 Alzheimer's disease, unspecified: Secondary | ICD-10-CM | POA: Diagnosis not present

## 2017-02-09 DIAGNOSIS — I4891 Unspecified atrial fibrillation: Secondary | ICD-10-CM | POA: Diagnosis not present

## 2017-02-09 DIAGNOSIS — I252 Old myocardial infarction: Secondary | ICD-10-CM | POA: Diagnosis not present

## 2017-02-09 DIAGNOSIS — I2510 Atherosclerotic heart disease of native coronary artery without angina pectoris: Secondary | ICD-10-CM | POA: Diagnosis not present

## 2017-02-09 DIAGNOSIS — F0281 Dementia in other diseases classified elsewhere with behavioral disturbance: Secondary | ICD-10-CM | POA: Diagnosis not present

## 2017-02-09 DIAGNOSIS — I5022 Chronic systolic (congestive) heart failure: Secondary | ICD-10-CM | POA: Diagnosis not present

## 2017-02-12 DIAGNOSIS — F0281 Dementia in other diseases classified elsewhere with behavioral disturbance: Secondary | ICD-10-CM | POA: Diagnosis not present

## 2017-02-12 DIAGNOSIS — I2510 Atherosclerotic heart disease of native coronary artery without angina pectoris: Secondary | ICD-10-CM | POA: Diagnosis not present

## 2017-02-12 DIAGNOSIS — I4891 Unspecified atrial fibrillation: Secondary | ICD-10-CM | POA: Diagnosis not present

## 2017-02-12 DIAGNOSIS — G309 Alzheimer's disease, unspecified: Secondary | ICD-10-CM | POA: Diagnosis not present

## 2017-02-12 DIAGNOSIS — I252 Old myocardial infarction: Secondary | ICD-10-CM | POA: Diagnosis not present

## 2017-02-12 DIAGNOSIS — I5022 Chronic systolic (congestive) heart failure: Secondary | ICD-10-CM | POA: Diagnosis not present

## 2017-02-13 DIAGNOSIS — I4891 Unspecified atrial fibrillation: Secondary | ICD-10-CM | POA: Diagnosis not present

## 2017-02-13 DIAGNOSIS — F0281 Dementia in other diseases classified elsewhere with behavioral disturbance: Secondary | ICD-10-CM | POA: Diagnosis not present

## 2017-02-13 DIAGNOSIS — I252 Old myocardial infarction: Secondary | ICD-10-CM | POA: Diagnosis not present

## 2017-02-13 DIAGNOSIS — G309 Alzheimer's disease, unspecified: Secondary | ICD-10-CM | POA: Diagnosis not present

## 2017-02-13 DIAGNOSIS — I5022 Chronic systolic (congestive) heart failure: Secondary | ICD-10-CM | POA: Diagnosis not present

## 2017-02-13 DIAGNOSIS — I2510 Atherosclerotic heart disease of native coronary artery without angina pectoris: Secondary | ICD-10-CM | POA: Diagnosis not present

## 2017-02-14 ENCOUNTER — Non-Acute Institutional Stay (SKILLED_NURSING_FACILITY): Payer: Medicare Other | Admitting: Gerontology

## 2017-02-14 ENCOUNTER — Encounter: Payer: Self-pay | Admitting: Gerontology

## 2017-02-14 DIAGNOSIS — G43909 Migraine, unspecified, not intractable, without status migrainosus: Secondary | ICD-10-CM

## 2017-02-14 DIAGNOSIS — F29 Unspecified psychosis not due to a substance or known physiological condition: Secondary | ICD-10-CM | POA: Diagnosis not present

## 2017-02-14 DIAGNOSIS — M1990 Unspecified osteoarthritis, unspecified site: Secondary | ICD-10-CM

## 2017-02-14 DIAGNOSIS — G308 Other Alzheimer's disease: Secondary | ICD-10-CM

## 2017-02-14 DIAGNOSIS — Z955 Presence of coronary angioplasty implant and graft: Secondary | ICD-10-CM | POA: Diagnosis not present

## 2017-02-14 DIAGNOSIS — E876 Hypokalemia: Secondary | ICD-10-CM

## 2017-02-14 DIAGNOSIS — F0280 Dementia in other diseases classified elsewhere without behavioral disturbance: Secondary | ICD-10-CM

## 2017-02-14 DIAGNOSIS — I2510 Atherosclerotic heart disease of native coronary artery without angina pectoris: Secondary | ICD-10-CM | POA: Diagnosis not present

## 2017-02-14 DIAGNOSIS — I5022 Chronic systolic (congestive) heart failure: Secondary | ICD-10-CM

## 2017-02-14 DIAGNOSIS — E1122 Type 2 diabetes mellitus with diabetic chronic kidney disease: Secondary | ICD-10-CM

## 2017-02-14 DIAGNOSIS — F0281 Dementia in other diseases classified elsewhere with behavioral disturbance: Secondary | ICD-10-CM | POA: Diagnosis not present

## 2017-02-14 DIAGNOSIS — R001 Bradycardia, unspecified: Secondary | ICD-10-CM | POA: Diagnosis not present

## 2017-02-14 DIAGNOSIS — G309 Alzheimer's disease, unspecified: Secondary | ICD-10-CM | POA: Diagnosis not present

## 2017-02-14 DIAGNOSIS — I110 Hypertensive heart disease with heart failure: Secondary | ICD-10-CM

## 2017-02-14 DIAGNOSIS — I252 Old myocardial infarction: Secondary | ICD-10-CM | POA: Diagnosis not present

## 2017-02-14 DIAGNOSIS — Z8673 Personal history of transient ischemic attack (TIA), and cerebral infarction without residual deficits: Secondary | ICD-10-CM

## 2017-02-14 DIAGNOSIS — E785 Hyperlipidemia, unspecified: Secondary | ICD-10-CM

## 2017-02-14 DIAGNOSIS — Z951 Presence of aortocoronary bypass graft: Secondary | ICD-10-CM

## 2017-02-14 DIAGNOSIS — E538 Deficiency of other specified B group vitamins: Secondary | ICD-10-CM | POA: Diagnosis not present

## 2017-02-14 DIAGNOSIS — F39 Unspecified mood [affective] disorder: Secondary | ICD-10-CM

## 2017-02-14 DIAGNOSIS — N182 Chronic kidney disease, stage 2 (mild): Secondary | ICD-10-CM

## 2017-02-14 DIAGNOSIS — I4891 Unspecified atrial fibrillation: Secondary | ICD-10-CM | POA: Diagnosis not present

## 2017-02-15 DIAGNOSIS — I252 Old myocardial infarction: Secondary | ICD-10-CM | POA: Diagnosis not present

## 2017-02-15 DIAGNOSIS — I5022 Chronic systolic (congestive) heart failure: Secondary | ICD-10-CM | POA: Diagnosis not present

## 2017-02-15 DIAGNOSIS — G309 Alzheimer's disease, unspecified: Secondary | ICD-10-CM | POA: Diagnosis not present

## 2017-02-15 DIAGNOSIS — I4891 Unspecified atrial fibrillation: Secondary | ICD-10-CM | POA: Diagnosis not present

## 2017-02-15 DIAGNOSIS — I2510 Atherosclerotic heart disease of native coronary artery without angina pectoris: Secondary | ICD-10-CM | POA: Diagnosis not present

## 2017-02-15 DIAGNOSIS — F0281 Dementia in other diseases classified elsewhere with behavioral disturbance: Secondary | ICD-10-CM | POA: Diagnosis not present

## 2017-02-16 DIAGNOSIS — I252 Old myocardial infarction: Secondary | ICD-10-CM | POA: Diagnosis not present

## 2017-02-16 DIAGNOSIS — I4891 Unspecified atrial fibrillation: Secondary | ICD-10-CM | POA: Diagnosis not present

## 2017-02-16 DIAGNOSIS — G309 Alzheimer's disease, unspecified: Secondary | ICD-10-CM | POA: Diagnosis not present

## 2017-02-16 DIAGNOSIS — I5022 Chronic systolic (congestive) heart failure: Secondary | ICD-10-CM | POA: Diagnosis not present

## 2017-02-16 DIAGNOSIS — F0281 Dementia in other diseases classified elsewhere with behavioral disturbance: Secondary | ICD-10-CM | POA: Diagnosis not present

## 2017-02-16 DIAGNOSIS — I2510 Atherosclerotic heart disease of native coronary artery without angina pectoris: Secondary | ICD-10-CM | POA: Diagnosis not present

## 2017-02-19 DIAGNOSIS — I4891 Unspecified atrial fibrillation: Secondary | ICD-10-CM | POA: Diagnosis not present

## 2017-02-19 DIAGNOSIS — I252 Old myocardial infarction: Secondary | ICD-10-CM | POA: Diagnosis not present

## 2017-02-19 DIAGNOSIS — G309 Alzheimer's disease, unspecified: Secondary | ICD-10-CM | POA: Diagnosis not present

## 2017-02-19 DIAGNOSIS — I2510 Atherosclerotic heart disease of native coronary artery without angina pectoris: Secondary | ICD-10-CM | POA: Diagnosis not present

## 2017-02-19 DIAGNOSIS — I5022 Chronic systolic (congestive) heart failure: Secondary | ICD-10-CM | POA: Diagnosis not present

## 2017-02-19 DIAGNOSIS — F0281 Dementia in other diseases classified elsewhere with behavioral disturbance: Secondary | ICD-10-CM | POA: Diagnosis not present

## 2017-02-20 DIAGNOSIS — F0281 Dementia in other diseases classified elsewhere with behavioral disturbance: Secondary | ICD-10-CM | POA: Diagnosis not present

## 2017-02-20 DIAGNOSIS — G309 Alzheimer's disease, unspecified: Secondary | ICD-10-CM | POA: Diagnosis not present

## 2017-02-20 DIAGNOSIS — I2510 Atherosclerotic heart disease of native coronary artery without angina pectoris: Secondary | ICD-10-CM | POA: Diagnosis not present

## 2017-02-20 DIAGNOSIS — I252 Old myocardial infarction: Secondary | ICD-10-CM | POA: Diagnosis not present

## 2017-02-20 DIAGNOSIS — I4891 Unspecified atrial fibrillation: Secondary | ICD-10-CM | POA: Diagnosis not present

## 2017-02-20 DIAGNOSIS — I5022 Chronic systolic (congestive) heart failure: Secondary | ICD-10-CM | POA: Diagnosis not present

## 2017-02-21 DIAGNOSIS — G309 Alzheimer's disease, unspecified: Secondary | ICD-10-CM | POA: Diagnosis not present

## 2017-02-21 DIAGNOSIS — I4891 Unspecified atrial fibrillation: Secondary | ICD-10-CM | POA: Diagnosis not present

## 2017-02-21 DIAGNOSIS — F0281 Dementia in other diseases classified elsewhere with behavioral disturbance: Secondary | ICD-10-CM | POA: Diagnosis not present

## 2017-02-21 DIAGNOSIS — I2510 Atherosclerotic heart disease of native coronary artery without angina pectoris: Secondary | ICD-10-CM | POA: Diagnosis not present

## 2017-02-21 DIAGNOSIS — I5022 Chronic systolic (congestive) heart failure: Secondary | ICD-10-CM | POA: Diagnosis not present

## 2017-02-21 DIAGNOSIS — I252 Old myocardial infarction: Secondary | ICD-10-CM | POA: Diagnosis not present

## 2017-02-22 DIAGNOSIS — I2510 Atherosclerotic heart disease of native coronary artery without angina pectoris: Secondary | ICD-10-CM | POA: Diagnosis not present

## 2017-02-22 DIAGNOSIS — F0281 Dementia in other diseases classified elsewhere with behavioral disturbance: Secondary | ICD-10-CM | POA: Diagnosis not present

## 2017-02-22 DIAGNOSIS — I5022 Chronic systolic (congestive) heart failure: Secondary | ICD-10-CM | POA: Diagnosis not present

## 2017-02-22 DIAGNOSIS — I252 Old myocardial infarction: Secondary | ICD-10-CM | POA: Diagnosis not present

## 2017-02-22 DIAGNOSIS — G309 Alzheimer's disease, unspecified: Secondary | ICD-10-CM | POA: Diagnosis not present

## 2017-02-22 DIAGNOSIS — I4891 Unspecified atrial fibrillation: Secondary | ICD-10-CM | POA: Diagnosis not present

## 2017-02-23 DIAGNOSIS — G309 Alzheimer's disease, unspecified: Secondary | ICD-10-CM | POA: Diagnosis not present

## 2017-02-23 DIAGNOSIS — I5022 Chronic systolic (congestive) heart failure: Secondary | ICD-10-CM | POA: Diagnosis not present

## 2017-02-23 DIAGNOSIS — I4891 Unspecified atrial fibrillation: Secondary | ICD-10-CM | POA: Diagnosis not present

## 2017-02-23 DIAGNOSIS — F0281 Dementia in other diseases classified elsewhere with behavioral disturbance: Secondary | ICD-10-CM | POA: Diagnosis not present

## 2017-02-23 DIAGNOSIS — I2510 Atherosclerotic heart disease of native coronary artery without angina pectoris: Secondary | ICD-10-CM | POA: Diagnosis not present

## 2017-02-23 DIAGNOSIS — I252 Old myocardial infarction: Secondary | ICD-10-CM | POA: Diagnosis not present

## 2017-02-24 DIAGNOSIS — I5022 Chronic systolic (congestive) heart failure: Secondary | ICD-10-CM | POA: Diagnosis not present

## 2017-02-24 DIAGNOSIS — G309 Alzheimer's disease, unspecified: Secondary | ICD-10-CM | POA: Diagnosis not present

## 2017-02-24 DIAGNOSIS — F0281 Dementia in other diseases classified elsewhere with behavioral disturbance: Secondary | ICD-10-CM | POA: Diagnosis not present

## 2017-02-24 DIAGNOSIS — I4891 Unspecified atrial fibrillation: Secondary | ICD-10-CM | POA: Diagnosis not present

## 2017-02-24 DIAGNOSIS — I252 Old myocardial infarction: Secondary | ICD-10-CM | POA: Diagnosis not present

## 2017-02-24 DIAGNOSIS — I2510 Atherosclerotic heart disease of native coronary artery without angina pectoris: Secondary | ICD-10-CM | POA: Diagnosis not present

## 2017-02-26 DIAGNOSIS — I2510 Atherosclerotic heart disease of native coronary artery without angina pectoris: Secondary | ICD-10-CM | POA: Diagnosis not present

## 2017-02-26 DIAGNOSIS — I252 Old myocardial infarction: Secondary | ICD-10-CM | POA: Diagnosis not present

## 2017-02-26 DIAGNOSIS — G309 Alzheimer's disease, unspecified: Secondary | ICD-10-CM | POA: Diagnosis not present

## 2017-02-26 DIAGNOSIS — I5022 Chronic systolic (congestive) heart failure: Secondary | ICD-10-CM | POA: Diagnosis not present

## 2017-02-26 DIAGNOSIS — I4891 Unspecified atrial fibrillation: Secondary | ICD-10-CM | POA: Diagnosis not present

## 2017-02-26 DIAGNOSIS — F0281 Dementia in other diseases classified elsewhere with behavioral disturbance: Secondary | ICD-10-CM | POA: Diagnosis not present

## 2017-02-27 ENCOUNTER — Encounter
Admission: RE | Admit: 2017-02-27 | Discharge: 2017-02-27 | Disposition: A | Discharge status: Home / Self Care | Payer: Medicare Other | Source: Ambulatory Visit | Attending: Internal Medicine | Admitting: Internal Medicine

## 2017-03-01 NOTE — Progress Notes (Signed)
Location:   [facility] Room Number: [place] of Service:  SNF [phone]) Provider:  Toni Arthurs, NP-C   Patient Care Team: Albina Billet, MD as PCP - General (Internal Medicine)  Extended Emergency Contact Information Primary Emergency Contact: Coble,Dean Address: [address], [place] [address] [place] Phone: [phone] Relation: Son Secondary Emergency Contact: Jolaine Click Address: [address], [place] [address] Home Phone: [phone] Work Phone: [phone] Relation: None  Code Status:  DNR Goals of care: Advanced Directive information Advanced Directives 02/14/2017  Does Patient Have a Medical Advance Directive? Yes  Type of Advance Directive Out of facility DNR (pink MOST or yellow form)  Does patient want to make changes to medical advance directive? No - Patient declined  Pre-existing out of facility DNR order (yellow form or pink MOST form) -     Chief Complaint  Patient presents with  . Medical Management of Chronic Issues    Routine Visit    HPI:  Pt is a 84 y.o. female seen today for medical management of chronic diseases.  Patient's mood improved over the past month.  She is having fewer angry outbursts and less combativeness.  No longer an elopement risk.  Continues the slow decline.  Sleeping more.  Having increased confusion, forgetfulness.  Staff is continuing to have to feed at least one meal per day.  Patient does have occasional arguments with other residents and has to be redirected.  However, these are only verbal and not physical altercations.  Topamax has been DC'd without difficulty.  Patient continues on hospice services.  Vital signs stable.  No other complaints.  Past Medical History:  Diagnosis Date  . A-fib ([place])   . Chronic CHF (congestive heart failure) ([place])   . Diabetes mellitus with stage 2 chronic kidney disease ([place])   . Hx of transient ischemic attack (TIA)   .  Hyperlipidemia   . Hypertension   . Varicose veins    Past Surgical History:  Procedure Laterality Date  . APPENDECTOMY    . CHOLECYSTECTOMY    . CORONARY ARTERY BYPASS GRAFT N/A 07/27/2013   Procedure: CORONARY ARTERY BYPASS GRAFTING (CABG) POSS. MITRAL REPAIR;  Surgeon: [name], MD;  Location: [facility];  Service: Open Heart Surgery;  Laterality: N/A;  Coronary artery bypass graft times four using left internal mammary artery and right saphenous leg vein using endoscope.  Marland Kitchen KNEE SURGERY      No Known Allergies  Allergies as of 02/14/2017   No Known Allergies     Medication List        Accurate as of 02/14/17 11:59 PM. Always use your most recent med list.          acetaminophen 325 MG tablet Commonly known as:  TYLENOL Take 650 mg by mouth every 4 (four) hours as needed for fever. May administer orally, per G-tube if needed or rectally if unable to swallow. Maximum dose for 24 hours is 3000 mg from all sources of Acetaminophen /Tylenol   acetaminophen 325 MG tablet Commonly known as:  TYLENOL Take 650 mg by mouth 3 (three) times daily.   ASPERCREME W/LIDOCAINE 4 % cream Generic drug:  lidocaine Apply 1 application topically 3 (three) times daily. Apply thin layer to bilateral knees   aspirin 81 MG chewable tablet Chew 1 tablet (81 mg total) by  mouth daily.   Cholecalciferol 2000 units Caps Take 1 capsule by mouth daily.   cyanocobalamin 1000 MCG tablet Take 1,000 mcg by mouth daily.   divalproex 500 MG DR tablet Commonly known as:  DEPAKOTE Take 500 mg by mouth 2 (two) times daily.   ELIQUIS 2.5 MG Tabs tablet Generic drug:  apixaban Take 2.5 mg by mouth 2 (two) times daily.   LORazepam 0.5 MG tablet Commonly known as:  ATIVAN Take 0.5 tablets (0.25 mg total) by mouth daily. 1/2 tablet (0.25 mg), ok to hold for sedation. Attempting GDR   Magnesium Oxide 200 MG Tabs Take 200 mg by mouth daily.   Melatonin 3 MG Tabs Take 3 mg by mouth at  bedtime.   polyethylene glycol packet Commonly known as:  MIRALAX / GLYCOLAX Take 17 g by mouth daily.   potassium chloride SA 20 MEQ tablet Commonly known as:  K-DUR,KLOR-CON Take 20 mEq by mouth 2 (two) times daily.   PRESERVISION AREDS Tabs Take 1 tablet by mouth daily.   risperiDONE 0.5 MG tablet Commonly known as:  RISPERDAL Take 0.25-0.5 mg by mouth 2 (two) times daily. Give 1/2 (0.25 mg) tablet in morning and 1 (0.5 mg) tablet in the evening for psychosis.   SENNA-PLUS 8.6-50 MG tablet Generic drug:  senna-docusate Take 1 tablet by mouth 2 (two) times daily.   torsemide 20 MG tablet Commonly known as:  DEMADEX Take 30 mg by mouth daily. 1 and 1/2 tablet   UTI-STAT Liqd Take 30 mLs by mouth daily.       Review of Systems  Unable to perform ROS: Dementia  Constitutional: Negative for activity change, appetite change, chills, diaphoresis and fever.  HENT: Negative.  Negative for congestion, mouth sores, nosebleeds, postnasal drip, sneezing, sore throat, trouble swallowing and voice change.   Eyes: Negative for pain, redness and visual disturbance.  Respiratory: Negative for apnea, cough, choking, chest tightness, shortness of breath and wheezing.   Cardiovascular: Negative for chest pain, palpitations and leg swelling.  Gastrointestinal: Negative for abdominal distention, abdominal pain, constipation, diarrhea and nausea.  Genitourinary: Negative for difficulty urinating, dysuria, frequency and urgency.  Musculoskeletal: Positive for arthralgias (typical arthritis). Negative for back pain, gait problem and myalgias.  Skin: Negative for color change, pallor, rash and wound.  Neurological: Negative for dizziness, tremors, syncope, speech difficulty, weakness, numbness and headaches.  Psychiatric/Behavioral: Positive for confusion. Negative for agitation and behavioral problems.  All other systems reviewed and are negative.   Immunization History  Administered Date(s)  Administered  . Influenza-Unspecified 11/07/2013, 11/17/2014, 11/11/2015, 11/15/2016  . PPD Test 11/24/2014, 11/27/2015  . Pneumococcal-Unspecified 01/21/2014   Pertinent  Health Maintenance Due  Topic Date Due  . FOOT EXAM  10/06/1938  . OPHTHALMOLOGY EXAM  10/06/1938  . URINE MICROALBUMIN  10/06/1938  . DEXA SCAN  10/07/1995  . HEMOGLOBIN A1C  01/26/2014  . PNA vac Low Risk Adult (2 of 2 - PCV13) 01/22/2015  . INFLUENZA VACCINE  Completed   No flowsheet data found. Functional Status Survey:    Vitals:   02/14/17 0928  BP: 101/68  Pulse: 88  Resp: 20  Temp: 98 F (36.7 C)  TempSrc: Oral  SpO2: 96%  Weight: 219 lb 1.6 oz (99.4 kg)  Height: 5\' 7"  (1.702 m)   Body mass index is 34.32 kg/m. Physical Exam  Constitutional: She is oriented to person, place, and time. Vital signs are normal. She appears well-developed and well-nourished. She is active and cooperative. She does not appear  ill. No distress.  HENT:  Head: Normocephalic and atraumatic.  Mouth/Throat: Uvula is midline, oropharynx is clear and moist and mucous membranes are normal. Mucous membranes are not pale, not dry and not cyanotic.  Eyes: Conjunctivae, EOM and lids are normal. Pupils are equal, round, and reactive to light.  Neck: Trachea normal, normal range of motion and full passive range of motion without pain. Neck supple. No JVD present. No tracheal deviation, no edema and no erythema present. No thyromegaly present.  Cardiovascular: Normal rate, regular rhythm, normal heart sounds, intact distal pulses and normal pulses. Exam reveals no gallop, no distant heart sounds and no friction rub.  No murmur heard. Pulses:      Dorsalis pedis pulses are 2+ on the right side, and 2+ on the left side.  Mild BLE edema  Pulmonary/Chest: Effort normal and breath sounds normal. No accessory muscle usage. No respiratory distress. She has no decreased breath sounds. She has no wheezes. She has no rhonchi. She has no  rales. She exhibits no tenderness.  Abdominal: Soft. Normal appearance and bowel sounds are normal. She exhibits no distension and no ascites. There is no tenderness.  Musculoskeletal: Normal range of motion. She exhibits no edema or tenderness.  Expected osteoarthritis, stiffness  Neurological: She is alert and oriented to person, place, and time. She has normal strength. Coordination and gait abnormal.  Skin: Skin is warm, dry and intact. No rash noted. She is not diaphoretic. No cyanosis or erythema. No pallor. Nails show no clubbing.  See description in the HPI  Psychiatric: Her speech is normal. Her affect is labile. She is not agitated. Thought content is delusional. Thought content is not paranoid. Cognition and memory are impaired. She expresses impulsivity and inappropriate judgment. She exhibits abnormal recent memory and abnormal remote memory.  Nursing note and vitals reviewed.   Labs reviewed: Recent Labs    04/27/16 0640  11/14/16 0655 12/06/16 0652 01/02/17 0432 01/03/17 0520 01/04/17 0440 01/05/17 0450  NA 140   < > 137 139 138  --   --   --   K 3.9   < > 3.3* 4.0 2.7* 2.9* 2.7* 3.7  CL 109   < > 103 104 100*  --   --   --   CO2 23   < > 26 26 26   --   --   --   GLUCOSE 108*   < > 109* 93 92  --   --   --   BUN 29*   < > 34* 38* 31*  --   --   --   CREATININE 1.60*   < > 1.24* 1.44* 1.32*  --   --   --   CALCIUM 8.7*   < > 8.8* 8.8* 8.6*  --   --   --   MG 2.4  --   --   --   --   --   --   --    < > = values in this interval not displayed.   Recent Labs    11/14/16 0655 12/06/16 0652 01/02/17 0432  AST 20 16 15   ALT 16 12* 11*  ALKPHOS 58 49 57  BILITOT 0.7 0.5 0.8  PROT 5.9* 5.6* 5.7*  ALBUMIN 3.2* 2.8* 2.9*   Recent Labs    04/27/16 0640 11/01/16 0423  WBC 6.6 5.3  NEUTROABS 4.2 2.9  HGB 10.7* 13.9  HCT 31.6* 42.4  MCV 90.1 97.4  PLT 144* 147*  Lab Results  Component Value Date   TSH 2.774 04/27/2016   Lab Results  Component Value Date     HGBA1C 6.0 (H) 07/26/2013   Lab Results  Component Value Date   CHOL 142 04/27/2016   HDL 56 04/27/2016   LDLCALC 70 04/27/2016   TRIG 81 04/27/2016   CHOLHDL 2.5 04/27/2016    Significant Diagnostic Results in last 30 days:  No results found.  Assessment/Plan 1. Other Alzheimer's disease  Variable  Progressive   2. 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 Unspecified psychosis not due to a substance or known physiological condition  Variable  Continue Depakote sprinkles 500 mg po BID  Continue Cholecalciferol 2,000 units po Q Day  Continue Risperdol 0.5 mg po BID- 1/2 tablet in the AM, 1 tablet in the PM  Continue melatonin 3 mg p.o. daily at 4 PM  Continue Lorazepam 0.25 mg p.o. daily at 4 PM for evening agitation  4. Mood disorder (HCC)  Variable  Continue Depakote sprinkles 500 mg po BID  Continue Cholecalciferol 2,000 units po Q Day  Continue Risperdol 0.5 mg po BID- 1/2 tablet in the AM, 1 tablet in the PM  5. Type 2 diabetes mellitus with stage 2 chronic kidney disease, without long-term current use of insulin (HCC)  Stable   Add pravastatin 10 mg p.o. weekly on Sunday for reduction of cardiovascular risks related to diabetes  6. Hypertensive heart disease with heart failure (HCC)  Stable  Continue Lisinopril 2.5 mg po Q Day  Continue ASA 81 mg po Q Day  7. Chronic systolic congestive heart failure (HCC)  Stable  Continue torsemide 30 mg po Q Day  8. Presence of coronary artery bypass graft stent  Stable   9. Bradycardia  Stable   10. Deficiency of other specified B group vitamins (CODE)  Stable  Continue Cyanocobalamin 1,000 mcg po Q Day  11. Old myocardial infarction  Stable   12. Atrial fibrillation, unspecified type (HCC)  Stable  Continue Eliquis 2.5 mg po BID  13. Hyperlipidemia, unspecified hyperlipidemia type  Stable  14.  Hypomagnesemia  Stable  Continue magnesium Oxide 200 mg po Q day  15. Hypokalemia  Stable  Continue potassium Chloride 20 meq po BID  16. Personal history of transient ischemic attack (TIA), and cerebral infarction without residual deficits  Stable   17. Migraine without status migrainosus, not intractable, unspecified migraine type  Stable  Discontinue Topamax 25 mg- 1/2 tablet po daily-slow taper off  18. Osteoarthritis  Continue Tylenol 650 mg po TID  Continue all medications as listed above (02/14/2017)  Family/ staff Communication:   Total Time:  Documentation:  Face to Face:  Family/Phone:   Labs/tests ordered:  Not due, on hospice services  Medication list reviewed and assessed for continued appropriateness. Monthly medication orders reviewed and signed.  Vikki Ports, NP-C [facility] [phone] [place], [place] [address] Cell Phone (Mon-Fri 8am-5pm):  [phone] On Call:  [phone] & follow prompts after 5pm & weekends Office Phone:  [phone] Office Fax:  [phone]

## 2017-03-01 NOTE — Progress Notes (Signed)
Location:   [facility] Room Number: [place] of Service:  SNF [phone]) Provider:  Toni Arthurs, NP-C   Patient Care Team: Albina Billet, MD as PCP - General (Internal Medicine)  Extended Emergency Contact Information Primary Emergency Contact: Coble,Dean Address: [address], [place] [address] [place] Phone: [phone] Relation: Son Secondary Emergency Contact: Jolaine Click Address: [address] [place], [place] [address] Home Phone: [phone] Work Phone: [phone] Relation: None  Code Status:  DNR Goals of care: Advanced Directive information Advanced Directives 02/14/2017  Does Patient Have a Medical Advance Directive? Yes  Type of Advance Directive Out of facility DNR (pink MOST or yellow form)  Does patient want to make changes to medical advance directive? No - Patient declined  Pre-existing out of facility DNR order (yellow form or pink MOST form) -     Chief Complaint  Patient presents with  . Medical Management of Chronic Issues    Routine Visit    HPI:  Pt is a 84 y.o. female seen today for medical management of chronic diseases.  Patient's mood stability has improved over the past month.  She is having fewer angry outbursts and less combativeness.  No longer an elopement risk.  Continue slow decline.  Sleeping more.  Having increased confusion, forgetfulness.  Staff is continuing to have to feed at least one meal per day.  Patient continues on hospice services.  Topamax being reduced to decrease polypharmacy.  Patient appears to be tolerant of this.  Vital signs stable.  No other complaints.  Past Medical History:  Diagnosis Date  . A-fib ([place])   . Chronic CHF (congestive heart failure) ([place])   . Diabetes mellitus with stage 2 chronic kidney disease ([place])   . Hx of transient ischemic attack (TIA)   . Hyperlipidemia   . Hypertension   . Varicose veins    Past Surgical History:  Procedure  Laterality Date  . APPENDECTOMY    . CHOLECYSTECTOMY    . CORONARY ARTERY BYPASS GRAFT N/A 07/27/2013   Procedure: CORONARY ARTERY BYPASS GRAFTING (CABG) POSS. MITRAL REPAIR;  Surgeon: [name], MD;  Location: [facility];  Service: Open Heart Surgery;  Laterality: N/A;  Coronary artery bypass graft times four using left internal mammary artery and right saphenous leg vein using endoscope.  Marland Kitchen KNEE SURGERY      No Known Allergies  Allergies as of 01/16/2017   No Known Allergies     Medication List        Accurate as of 01/16/17 11:59 PM. Always use your most recent med list.          acetaminophen 325 MG tablet Commonly known as:  TYLENOL Take 650 mg by mouth every 4 (four) hours as needed for fever. May administer orally, per G-tube if needed or rectally if unable to swallow. Maximum dose for 24 hours is 3000 mg from all sources of Acetaminophen /Tylenol   acetaminophen 325 MG tablet Commonly known as:  TYLENOL Take 650 mg by mouth 3 (three) times daily.   ASPERCREME W/LIDOCAINE 4 % cream Generic drug:  lidocaine Apply 1 application topically 3 (three) times daily. Apply thin layer to bilateral knees   aspirin 81 MG chewable tablet Chew 1 tablet (81 mg total) by mouth daily.   Cholecalciferol 2000 units Caps Take 1 capsule by mouth daily.  cyanocobalamin 1000 MCG tablet Take 1,000 mcg by mouth daily.   divalproex 500 MG DR tablet Commonly known as:  DEPAKOTE Take 500 mg by mouth 2 (two) times daily.   ELIQUIS 2.5 MG Tabs tablet Generic drug:  apixaban Take 2.5 mg by mouth 2 (two) times daily.   LORazepam 0.5 MG tablet Commonly known as:  ATIVAN Take 0.5 tablets (0.25 mg total) by mouth daily. 1/2 tablet (0.25 mg), ok to hold for sedation. Attempting GDR   Magnesium Oxide 200 MG Tabs Take 200 mg by mouth daily.   Melatonin 3 MG Tabs Take 3 mg by mouth at bedtime.   polyethylene glycol packet Commonly known as:  MIRALAX / GLYCOLAX Take 17 g by mouth  daily.   potassium chloride SA 20 MEQ tablet Commonly known as:  K-DUR,KLOR-CON Take 20 mEq by mouth 2 (two) times daily.   PRESERVISION AREDS Tabs Take 1 tablet by mouth daily.   risperiDONE 0.5 MG tablet Commonly known as:  RISPERDAL Take 0.25-0.5 mg by mouth 2 (two) times daily. Give 1/2 (0.25 mg) tablet in morning and 1 (0.5 mg) tablet in the evening for psychosis.   SENNA-PLUS 8.6-50 MG tablet Generic drug:  senna-docusate Take 1 tablet by mouth 2 (two) times daily.   topiramate 25 MG tablet Commonly known as:  TOPAMAX Take 12.5 mg 2 (two) times daily by mouth. 1/2 tab   torsemide 20 MG tablet Commonly known as:  DEMADEX Take 30 mg by mouth daily. 1 and 1/2 tablet   UTI-STAT Liqd Take 30 mLs by mouth daily.       Review of Systems  Unable to perform ROS: Dementia  Constitutional: Negative for activity change, appetite change, chills, diaphoresis and fever.  HENT: Negative.  Negative for congestion, mouth sores, nosebleeds, postnasal drip, sneezing, sore throat, trouble swallowing and voice change.   Eyes: Negative for pain, redness and visual disturbance.  Respiratory: Negative for apnea, cough, choking, chest tightness, shortness of breath and wheezing.   Cardiovascular: Negative for chest pain, palpitations and leg swelling.  Gastrointestinal: Negative for abdominal distention, abdominal pain, constipation, diarrhea and nausea.  Genitourinary: Negative for difficulty urinating, dysuria, frequency and urgency.  Musculoskeletal: Positive for arthralgias (typical arthritis). Negative for back pain, gait problem and myalgias.  Skin: Negative for color change, pallor, rash and wound.  Neurological: Negative for dizziness, tremors, syncope, speech difficulty, weakness, numbness and headaches.  Psychiatric/Behavioral: Positive for agitation, behavioral problems and confusion.  All other systems reviewed and are negative.   Immunization History  Administered Date(s)  Administered  . Influenza-Unspecified 11/07/2013, 11/17/2014, 11/11/2015, 11/15/2016  . PPD Test 11/24/2014, 11/27/2015  . Pneumococcal-Unspecified 01/21/2014   Pertinent  Health Maintenance Due  Topic Date Due  . FOOT EXAM  10/06/1938  . OPHTHALMOLOGY EXAM  10/06/1938  . URINE MICROALBUMIN  [birth year]06/1938  . DEXA SCAN  10/07/1995  . HEMOGLOBIN A1C  01/26/2014  . PNA vac Low Risk Adult (2 of 2 - PCV13) 01/22/2015  . INFLUENZA VACCINE  Completed   No flowsheet data found. Functional Status Survey:    Vitals:   01/16/17 0920  BP: 112/65  Pulse: 82  Resp: 18  Temp: 97.6 F (36.4 C)  TempSrc: Oral  SpO2: 96%  Weight: 208 lb 14.4 oz (94.8 kg)  Height: 5\' 7"  (1.702 m)   Body mass index is 32.72 kg/m. Physical Exam  Constitutional: She is oriented to person, place, and time. Vital signs are normal. She appears well-developed and well-nourished. She is active and cooperative.  She does not appear ill. No distress.  HENT:  Head: Normocephalic and atraumatic.  Mouth/Throat: Uvula is midline, oropharynx is clear and moist and mucous membranes are normal. Mucous membranes are not pale, not dry and not cyanotic.  Eyes: Conjunctivae, EOM and lids are normal. Pupils are equal, round, and reactive to light.  Neck: Trachea normal, normal range of motion and full passive range of motion without pain. Neck supple. No JVD present. No tracheal deviation, no edema and no erythema present. No thyromegaly present.  Cardiovascular: Normal rate, regular rhythm, normal heart sounds, intact distal pulses and normal pulses. Exam reveals no gallop, no distant heart sounds and no friction rub.  No murmur heard. Pulses:      Dorsalis pedis pulses are 2+ on the right side, and 2+ on the left side.  Mild BLE edema  Pulmonary/Chest: Effort normal and breath sounds normal. No accessory muscle usage. No respiratory distress. She has no decreased breath sounds. She has no wheezes. She has no rhonchi. She has no  rales. She exhibits no tenderness.  Abdominal: Soft. Normal appearance and bowel sounds are normal. She exhibits no distension and no ascites. There is no tenderness.  Musculoskeletal: Normal range of motion. She exhibits no edema or tenderness.  Expected osteoarthritis, stiffness  Neurological: She is alert and oriented to person, place, and time. She has normal strength. Coordination and gait abnormal.  Skin: Skin is warm, dry and intact. No rash noted. She is not diaphoretic. No cyanosis or erythema. No pallor. Nails show no clubbing.  See description in the HPI  Psychiatric: Her speech is normal. Her affect is labile. She is agitated. Thought content is paranoid and delusional. Cognition and memory are impaired. She expresses impulsivity and inappropriate judgment. She exhibits abnormal recent memory and abnormal remote memory.  Nursing note and vitals reviewed.   Labs reviewed: Recent Labs    04/27/16 0640  11/14/16 0655 12/06/16 0652 01/02/17 0432 01/03/17 0520 01/04/17 0440 01/05/17 0450  NA 140   < > 137 139 138  --   --   --   K 3.9   < > 3.3* 4.0 2.7* 2.9* 2.7* 3.7  CL 109   < > 103 104 100*  --   --   --   CO2 23   < > 26 26 26   --   --   --   GLUCOSE 108*   < > 109* 93 92  --   --   --   BUN 29*   < > 34* 38* 31*  --   --   --   CREATININE 1.60*   < > 1.24* 1.44* 1.32*  --   --   --   CALCIUM 8.7*   < > 8.8* 8.8* 8.6*  --   --   --   MG 2.4  --   --   --   --   --   --   --    < > = values in this interval not displayed.   Recent Labs    11/14/16 0655 12/06/16 0652 01/02/17 0432  AST 20 16 15   ALT 16 12* 11*  ALKPHOS 58 49 57  BILITOT 0.7 0.5 0.8  PROT 5.9* 5.6* 5.7*  ALBUMIN 3.2* 2.8* 2.9*   Recent Labs    04/27/16 0640 11/01/16 0423  WBC 6.6 5.3  NEUTROABS 4.2 2.9  HGB 10.7* 13.9  HCT 31.6* 42.4  MCV 90.1 97.4  PLT 144* 147*  Lab Results  Component Value Date   TSH 2.774 04/27/2016   Lab Results  Component Value Date   HGBA1C 6.0 (H)  07/26/2013   Lab Results  Component Value Date   CHOL 142 04/27/2016   HDL 56 04/27/2016   LDLCALC 70 04/27/2016   TRIG 81 04/27/2016   CHOLHDL 2.5 04/27/2016    Significant Diagnostic Results in last 30 days:  No results found.  Assessment/Plan 1. Other Alzheimer's disease  Variable  Progressive   2. 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 Unspecified psychosis not due to a substance or known physiological condition  Variable  Continue Depakote sprinkles 500 mg po BID  Continue Cholecalciferol 2,000 units po Q Day  Risperdol 0.5 mg po BID- 1/2 tablet in the AM, 1 tablet in the PM  Melatonin 3 mg p.o. daily at 4 PM  Lorazepam 0.25 mg p.o. daily at 4 PM for evening agitation  4. Mood disorder (HCC)  Variable  Increase Depakote sprinkles 500 mg po BID  Continue Cholecalciferol 2,000 units po Q Day  Risperdol 0.5 mg po BID- 1/2 tablet in the AM, 1 tablet in the PM  5. Type 2 diabetes mellitus with stage 2 chronic kidney disease, without long-term current use of insulin (HCC)  Stable   6. Hypertensive heart disease with heart failure (HCC)  Stable  Continue Lisinopril 2.5 mg po Q Day  Continue ASA 81 mg po Q Day  7. Chronic systolic congestive heart failure (HCC)  Stable  Increase Torsemide 30 mg po Q Day  8. Presence of coronary artery bypass graft stent  Stable   9. Bradycardia  Stable   10. Deficiency of other specified B group vitamins (CODE)  Stable  Continue Cyanocobalamin 1,000 mcg po Q Day  11. Old myocardial infarction  Stable   12. Atrial fibrillation, unspecified type (HCC)  Stable  Continue Eliquis 2.5 mg po BID  13. Hyperlipidemia, unspecified hyperlipidemia type  Stable  14. Hypomagnesemia  Stable  Decrease Magnesium Oxide 200 mg po Q day  15. Hypokalemia  Stable  Continue potassium Chloride 20 meq po BID  16. Personal history of  transient ischemic attack (TIA), and cerebral infarction without residual deficits  Stable   17. Migraine without status migrainosus, not intractable, unspecified migraine type  Stable  Continue Topamax 25 mg- 1/2 tablet po BID-slow taper off  18. Osteoarthritis  Continue Tylenol 650 mg po TID  Discontinue Voltaren 1% gel- 4 grams to both knees QID  Continue all medications as listed above (01/16/2017)  Family/ staff Communication:   Total Time:  Documentation:  Face to Face:  Family/Phone:   Labs/tests ordered:  Not due, on hospice services  Medication list reviewed and assessed for continued appropriateness. Monthly medication orders reviewed and signed.  Vikki Ports, NP-C Geriatrics [facility] [phone], [place] [address] Cell Phone (Mon-Fri 8am-5pm):  [phone] On Call:  [phone] & follow prompts after 5pm & weekends Office Phone:  [phone] Office Fax:  [phone]

## 2017-03-15 ENCOUNTER — Encounter: Payer: Self-pay | Admitting: Gerontology

## 2017-03-15 ENCOUNTER — Non-Acute Institutional Stay (SKILLED_NURSING_FACILITY): Payer: Medicare Other | Admitting: Gerontology

## 2017-03-15 DIAGNOSIS — F0281 Dementia in other diseases classified elsewhere with behavioral disturbance: Secondary | ICD-10-CM | POA: Diagnosis not present
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
## 2017-03-21 ENCOUNTER — Other Ambulatory Visit: Payer: Self-pay

## 2017-03-21 MED ORDER — LORAZEPAM 0.5 MG PO TABS: 0.5000 mg | ORAL_TABLET | Freq: Four times a day (QID) | ORAL | 3 refills | Status: DC | PRN

## 2017-03-21 MED ORDER — LORAZEPAM 0.5 MG PO TABS: 0.5000 mg | ORAL_TABLET | Freq: Every day | ORAL | 3 refills | Status: DC

## 2017-03-21 NOTE — Telephone Encounter (Signed)
Rx sent to [facility] phone : [phone] , fax : [phone]  

## 2017-03-23 ENCOUNTER — Encounter: Payer: Self-pay | Admitting: Gerontology

## 2017-03-23 ENCOUNTER — Non-Acute Institutional Stay (SKILLED_NURSING_FACILITY): Payer: Medicare Other | Admitting: Gerontology

## 2017-03-23 ENCOUNTER — Other Ambulatory Visit
Admission: RE | Admit: 2017-03-23 | Discharge: 2017-03-23 | Disposition: A | Discharge status: Home / Self Care | Payer: Medicare Other | Source: Ambulatory Visit | Attending: Gerontology | Admitting: Gerontology

## 2017-03-23 DIAGNOSIS — R0989 Other specified symptoms and signs involving the circulatory and respiratory systems: Secondary | ICD-10-CM | POA: Insufficient documentation

## 2017-03-23 DIAGNOSIS — J069 Acute upper respiratory infection, unspecified: Secondary | ICD-10-CM | POA: Diagnosis not present

## 2017-03-23 DIAGNOSIS — R05 Cough: Secondary | ICD-10-CM | POA: Diagnosis present

## 2017-03-23 DIAGNOSIS — R5381 Other malaise: Secondary | ICD-10-CM | POA: Insufficient documentation

## 2017-03-23 LAB — INFLUENZA PANEL BY PCR (TYPE A & B)
INFLAPCR: NEGATIVE
INFLBPCR: NEGATIVE

## 2017-03-23 NOTE — Progress Notes (Signed)
Location:   [facility] Room Number: [place] of Service:  SNF [phone]) Provider:  Toni Arthurs, NP-C  Albina Billet, MD  Patient Care Team: Albina Billet, MD as PCP - General (Internal Medicine)  Extended Emergency Contact Information Primary Emergency Contact: Coble,Dean Address: [address], [place] [address] [place] Phone: [phone] Relation: Son Secondary Emergency Contact: Jolaine Click Address: [address] [place], [place] [address] Home Phone: [phone] Work Phone: [phone] Relation: None  Code Status:  DNR Goals of care: Advanced Directive information Advanced Directives 03/23/2017  Does Patient Have a Medical Advance Directive? Yes  Type of Advance Directive Out of facility DNR (pink MOST or yellow form);[place];Living will  Does patient want to make changes to medical advance directive? No - Patient declined  Copy of [place] in Chart? Yes  Pre-existing out of facility DNR order (yellow form or pink MOST form) Yellow form placed in chart (order not valid for inpatient use)     Chief Complaint  Patient presents with  . Acute Visit    Recheck congestion and cough    HPI:  Pt is a 84 y.o. female seen today for an acute visit for c/o cough and congestion. Pt reports cough and sneeze. Non-productive cough. However, she reports she "feels fine." Afebrile. VSS. No other complaints. No recent aspiration episodes.     Past Medical History:  Diagnosis Date  . A-fib ([place])   . Chronic CHF (congestive heart failure) ([place])   . Diabetes mellitus with stage 2 chronic kidney disease ([place])   . Hx of transient ischemic attack (TIA)   . Hyperlipidemia   . Hypertension   . Varicose veins    Past Surgical History:  Procedure Laterality Date  . APPENDECTOMY    . CHOLECYSTECTOMY    . CORONARY ARTERY BYPASS GRAFT N/A 07/27/2013   Procedure: CORONARY ARTERY BYPASS  GRAFTING (CABG) POSS. MITRAL REPAIR;  Surgeon: [name], MD;  Location: [facility];  Service: Open Heart Surgery;  Laterality: N/A;  Coronary artery bypass graft times four using left internal mammary artery and right saphenous leg vein using endoscope.  Marland Kitchen KNEE SURGERY      No Known Allergies  Allergies as of 03/23/2017   No Known Allergies     Medication List        Accurate as of 03/23/17  3:57 PM. Always use your most recent med list.          acetaminophen 325 MG tablet Commonly known as:  TYLENOL Take 650 mg by mouth every 4 (four) hours as needed for fever. May administer orally, per G-tube if needed or rectally if unable to swallow. Maximum dose for 24 hours is 3000 mg from all sources of Acetaminophen /Tylenol   acetaminophen 325 MG tablet Commonly known as:  TYLENOL Take 650 mg by mouth 4 (four) times daily.   ASPERCREME W/LIDOCAINE 4 % cream Generic drug:  lidocaine Apply 1 application topically 3 (three) times daily. Apply thin layer to bilateral knees   aspirin 81 MG chewable tablet Chew 1 tablet (81 mg total) by mouth daily.   Cholecalciferol 2000 units Caps Take 1 capsule by mouth daily.   cyanocobalamin 1000 MCG tablet Take 1,000 mcg by mouth daily.   divalproex 500 MG DR tablet Commonly known as:  DEPAKOTE Take 500  mg by mouth 2 (two) times daily.   ELIQUIS 2.5 MG Tabs tablet Generic drug:  apixaban Take 2.5 mg by mouth 2 (two) times daily.   LORazepam 0.5 MG tablet Commonly known as:  ATIVAN Take 0.5 mg by mouth daily. For evening agitation. Ok to hold for sedation.   LORazepam 0.5 MG tablet Commonly known as:  ATIVAN Take 1 tablet (0.5 mg total) by mouth every 6 (six) hours as needed.   Magnesium Oxide 200 MG Tabs Take 200 mg by mouth daily.   Melatonin 3 MG Tabs Take 3 mg by mouth at bedtime.   polyethylene glycol packet Commonly known as:  MIRALAX / GLYCOLAX Take 17 g by mouth daily.   potassium chloride SA 20 MEQ  tablet Commonly known as:  K-DUR,KLOR-CON Take 20 mEq by mouth 2 (two) times daily.   pravastatin 10 MG tablet Commonly known as:  PRAVACHOL Take 10 mg by mouth once a week. On Sunday   PRESERVISION AREDS Tabs Take 1 tablet by mouth daily.   risperiDONE 0.5 MG tablet Commonly known as:  RISPERDAL Take 0.5 mg by mouth 2 (two) times daily.   SENNA-PLUS 8.6-50 MG tablet Generic drug:  senna-docusate Take 1 tablet by mouth as needed.   torsemide 20 MG tablet Commonly known as:  DEMADEX Take 30 mg by mouth daily. 1 and 1/2 tablet   UTI-STAT Liqd Take 30 mLs by mouth daily.       Review of Systems  Unable to perform ROS: Dementia  Constitutional: Negative for activity change, appetite change, chills, diaphoresis and fever.  HENT: Negative for congestion, mouth sores, nosebleeds, postnasal drip, sneezing, sore throat, trouble swallowing and voice change.   Respiratory: Negative for apnea, cough, choking, chest tightness, shortness of breath and wheezing.   Cardiovascular: Negative for chest pain, palpitations and leg swelling.  Gastrointestinal: Negative for abdominal distention, abdominal pain, constipation, diarrhea and nausea.  Genitourinary: Negative for difficulty urinating, dysuria, frequency and urgency.  Musculoskeletal: Negative for back pain, gait problem and myalgias. Arthralgias: typical arthritis.  Skin: Negative for color change, pallor, rash and wound.  Neurological: Negative for dizziness, tremors, syncope, speech difficulty, weakness, numbness and headaches.  Psychiatric/Behavioral: Negative for agitation and behavioral problems.  All other systems reviewed and are negative.   Immunization History  Administered Date(s) Administered  . Influenza-Unspecified 11/07/2013, 11/17/2014, 11/11/2015, 11/15/2016  . PPD Test 11/24/2014, 11/27/2015  . Pneumococcal-Unspecified 01/21/2014   Pertinent  Health Maintenance Due  Topic Date Due  . FOOT EXAM  10/06/1938  .  OPHTHALMOLOGY EXAM  10/06/1938  . URINE MICROALBUMIN  10/06/1938  . DEXA SCAN  10/07/1995  . HEMOGLOBIN A1C  01/26/2014  . PNA vac Low Risk Adult (2 of 2 - PCV13) 01/22/2015  . INFLUENZA VACCINE  Completed   No flowsheet data found. Functional Status Survey:    Vitals:   03/23/17 1547  BP: 120/76  Pulse: (!) 112  Resp: (!) 22  Temp: 97.6 F (36.4 C)  TempSrc: Oral  SpO2: 97%  Weight: 215 lb 3.2 oz (97.6 kg)  Height: 5\' 7"  (1.702 m)   Body mass index is 33.71 kg/m. Physical Exam  Constitutional: She is oriented to person, place, and time. Vital signs are normal. She appears well-developed and well-nourished. She is active and cooperative. She does not appear ill. No distress.  HENT:  Head: Normocephalic and atraumatic.  Mouth/Throat: Uvula is midline, oropharynx is clear and moist and mucous membranes are normal. Mucous membranes are not pale, not dry and  not cyanotic.  Eyes: Conjunctivae, EOM and lids are normal. Pupils are equal, round, and reactive to light.  Neck: Trachea normal, normal range of motion and full passive range of motion without pain. Neck supple. No JVD present. No tracheal deviation, no edema and no erythema present. No thyromegaly present.  Cardiovascular: Normal rate, regular rhythm, normal heart sounds, intact distal pulses and normal pulses. Exam reveals no gallop, no distant heart sounds and no friction rub.  No murmur heard. Pulses:      Dorsalis pedis pulses are 2+ on the right side, and 2+ on the left side.  No edema  Pulmonary/Chest: Effort normal and breath sounds normal. No accessory muscle usage. No respiratory distress. She has no decreased breath sounds. She has no wheezes. She has no rhonchi. She has no rales. She exhibits no tenderness.  Abdominal: Soft. Normal appearance and bowel sounds are normal. She exhibits no distension and no ascites. There is no tenderness.  Musculoskeletal: Normal range of motion. She exhibits no edema or tenderness.   Expected osteoarthritis, stiffness; Bilateral Calves soft, supple. Negative Homan's Sign. B- pedal pulses equal  Neurological: She is alert and oriented to person, place, and time. She has normal strength.  Skin: Skin is warm, dry and intact. She is not diaphoretic. No cyanosis. No pallor. Nails show no clubbing.  Psychiatric: She has a normal mood and affect. Her speech is normal and behavior is normal. Judgment and thought content normal. Cognition and memory are normal.  Nursing note and vitals reviewed.   Labs reviewed: Recent Labs    04/27/16 0640  11/14/16 0655 12/06/16 0652 01/02/17 0432 01/03/17 0520 01/04/17 0440 01/05/17 0450  NA 140   < > 137 139 138  --   --   --   K 3.9   < > 3.3* 4.0 2.7* 2.9* 2.7* 3.7  CL 109   < > 103 104 100*  --   --   --   CO2 23   < > 26 26 26   --   --   --   GLUCOSE 108*   < > 109* 93 92  --   --   --   BUN 29*   < > 34* 38* 31*  --   --   --   CREATININE 1.60*   < > 1.24* 1.44* 1.32*  --   --   --   CALCIUM 8.7*   < > 8.8* 8.8* 8.6*  --   --   --   MG 2.4  --   --   --   --   --   --   --    < > = values in this interval not displayed.   Recent Labs    11/14/16 0655 12/06/16 0652 01/02/17 0432  AST 20 16 15   ALT 16 12* 11*  ALKPHOS 58 49 57  BILITOT 0.7 0.5 0.8  PROT 5.9* 5.6* 5.7*  ALBUMIN 3.2* 2.8* 2.9*   Recent Labs    04/27/16 0640 11/01/16 0423  WBC 6.6 5.3  NEUTROABS 4.2 2.9  HGB 10.7* 13.9  HCT 31.6* 42.4  MCV 90.1 97.4  PLT 144* 147*   Lab Results  Component Value Date   TSH 2.774 04/27/2016   Lab Results  Component Value Date   HGBA1C 6.0 (H) 07/26/2013   Lab Results  Component Value Date   CHOL 142 04/27/2016   HDL 56 04/27/2016   LDLCALC 70 04/27/2016   TRIG 81 04/27/2016  CHOLHDL 2.5 04/27/2016    Significant Diagnostic Results in last 30 days:  No results found.  Assessment/Plan Upper respiratory tract infection, unspecified type  Guaifenesin 10 mL PO q 4 hours prn  Encourage po fluid  intake  Monitor  Family/ staff Communication:   Total Time:  Documentation:  Face to Face:  Family/Phone:   Labs/tests ordered:    Medication list reviewed and assessed for continued appropriateness.  Vikki Ports, NP-C Geriatrics [facility] [phone], [place] [address] Cell Phone (Mon-Fri 8am-5pm):  [phone] On Call:  [phone] & follow prompts after 5pm & weekends Office Phone:  [phone] Office Fax:  [phone]

## 2017-03-28 ENCOUNTER — Non-Acute Institutional Stay (SKILLED_NURSING_FACILITY): Payer: Medicare Other | Admitting: Gerontology

## 2017-03-28 ENCOUNTER — Encounter: Payer: Self-pay | Admitting: Gerontology

## 2017-03-28 DIAGNOSIS — I252 Old myocardial infarction: Secondary | ICD-10-CM | POA: Diagnosis not present

## 2017-03-28 DIAGNOSIS — I110 Hypertensive heart disease with heart failure: Secondary | ICD-10-CM | POA: Diagnosis not present

## 2017-03-28 DIAGNOSIS — G43909 Migraine, unspecified, not intractable, without status migrainosus: Secondary | ICD-10-CM | POA: Diagnosis not present

## 2017-03-28 DIAGNOSIS — I4891 Unspecified atrial fibrillation: Secondary | ICD-10-CM

## 2017-03-30 ENCOUNTER — Encounter
Admission: RE | Admit: 2017-03-30 | Discharge: 2017-03-30 | Disposition: A | Discharge status: Home / Self Care | Source: Ambulatory Visit | Attending: Internal Medicine | Admitting: Internal Medicine

## 2017-04-03 NOTE — Assessment & Plan Note (Signed)
Stable. No recent cardiac events. On ASA 81 Q Day.

## 2017-04-03 NOTE — Assessment & Plan Note (Signed)
No recent c/o migraine headaches. Was on suppressive therapy. Topamax weaned and D/C'ed to decrease polypharmacy

## 2017-04-03 NOTE — Assessment & Plan Note (Signed)
Stable. No recent episodes of hypotension. No recent CHF exacerbations. Denies chest pain or shortness of breath. Symptoms controlled with Torsemide and potassium. On ASA 81 mg Q Day

## 2017-04-03 NOTE — Assessment & Plan Note (Signed)
Stable. No recent exacerbations. No medications needed for rate control. Denies chest pain or shortness of breath.

## 2017-04-03 NOTE — Progress Notes (Signed)
[facility] Room Number: 310A Place of Service:  SNF (31) Provider:  Toni Arthurs, NP-C  Kirk Ruths, MD  Patient Care Team: Kirk Ruths, MD as PCP - General (Internal Medicine) Toni Arthurs, NP as Nurse Practitioner (Family Medicine)  Extended Emergency Contact Information Primary Emergency Contact: Coble,Dean Address: [address] [place], [place] [address] [place] Phone: [phone] Relation: Son Secondary Emergency Contact: Jolaine Click Address: [address] [place], [place] [address] Home Phone: [phone] Work Phone: [phone] Relation: None  Code Status:  DNR Goals of care: Advanced Directive information Advanced Directives 03/28/2017  Does Patient Have a Medical Advance Directive? Yes  Type of Par[place] of [place];Living will;Out of facility DNR (pink MOST or yellow form)  Does patient want to make changes to medical advance directive? No - Patient declined  Copy of [place] in Chart? Yes  Pre-existing out of facility DNR order (yellow form or pink MOST form) Yellow form placed in chart (order not valid for inpatient use)     Chief Complaint  Patient presents with  . Medical Management of Chronic Issues    Routine Visit    HPI:  Pt is a 84 y.o. female seen today for medical management of chronic diseases.    Hypertensive heart disease with heart failure (HCC) Stable. No recent episodes of hypotension. No recent CHF exacerbations. Denies chest pain or shortness of breath. Symptoms controlled with Torsemide and potassium. On ASA 81 mg Q Day  Migraine without status migrainosus, not intractable No recent c/o migraine headaches. Was on suppressive therapy. Topamax weaned and D/C'ed to decrease polypharmacy  Old myocardial infarction Stable. No recent cardiac events. On ASA 81 Q Day.   Unspecified atrial fibrillation (HCC) Stable. No recent  exacerbations. No medications needed for rate control. Denies chest pain or shortness of breath.  Please note pt with limited verbal ability/ Dementia. Unable to obtain complete ROS. Some ROS info obtained from staff and documentation.   Past Medical History:  Diagnosis Date  . A-fib ([place])   . Chronic CHF (congestive heart failure) ([place])   . Diabetes mellitus with stage 2 chronic kidney disease ([place])   . Hx of transient ischemic attack (TIA)   . Hyperlipidemia   . Hypertension   . Varicose veins    Past Surgical History:  Procedure Laterality Date  . APPENDECTOMY    . CHOLECYSTECTOMY    . CORONARY ARTERY BYPASS GRAFT N/A 07/27/2013   Procedure: CORONARY ARTERY BYPASS GRAFTING (CABG) POSS. MITRAL REPAIR;  Surgeon: [name], MD;  Location: [facility];  Service: Open Heart Surgery;  Laterality: N/A;  Coronary artery bypass graft times four using left internal mammary artery and right saphenous leg vein using endoscope.  Marland Kitchen KNEE SURGERY      No Known Allergies  Allergies as of 03/28/2017   No Known Allergies     Medication List        Accurate as of 03/28/17 11:59 PM. Always use your most recent med list.          acetaminophen 325 MG tablet Commonly known as:  TYLENOL Take 650 mg by mouth every 4 (four) hours as needed for fever. May administer orally, per G-tube if needed or rectally if unable to swallow. Maximum dose for 24 hours is 3000 mg from all sources of Acetaminophen [place] Pink  acetaminophen 325 MG tablet Commonly known as:  TYLENOL Take 650 mg by mouth 4 (four) times daily.   ASPERCREME W/LIDOCAINE 4 % cream Generic drug:  lidocaine Apply 1 application topically 3 (three) times daily. Apply thin layer to bilateral knees   aspirin 81 MG chewable tablet Chew 1 tablet (81 mg total) by mouth daily.   Cholecalciferol 2000 units Caps Take 1 capsule by mouth daily.   cyanocobalamin 1000 MCG tablet Take 1,000 mcg by mouth daily.   divalproex 500 MG DR  tablet Commonly known as:  DEPAKOTE Take 500 mg by mouth 2 (two) times daily.   ELIQUIS 2.5 MG Tabs tablet Generic drug:  apixaban Take 2.5 mg by mouth 2 (two) times daily.   LORazepam 0.5 MG tablet Commonly known as:  ATIVAN Take 0.5 mg by mouth daily. For evening agitation. Ok to hold for sedation.   LORazepam 0.5 MG tablet Commonly known as:  ATIVAN Take 1 tablet (0.5 mg total) by mouth every 6 (six) hours as needed.   Magnesium Oxide 200 MG Tabs Take 200 mg by mouth daily.   Melatonin 3 MG Tabs Take 3 mg by mouth at bedtime.   polyethylene glycol packet Commonly known as:  MIRALAX / GLYCOLAX Take 17 g by mouth daily.   potassium chloride SA 20 MEQ tablet Commonly known as:  K-DUR,KLOR-CON Take 20 mEq by mouth 2 (two) times daily.   pravastatin 10 MG tablet Commonly known as:  PRAVACHOL Take 10 mg by mouth once a week. On Sunday   PRESERVISION AREDS Tabs Take 1 tablet by mouth daily.   risperiDONE 0.5 MG tablet Commonly known as:  RISPERDAL Take 0.5 mg by mouth 2 (two) times daily.   SENNA-PLUS 8.6-50 MG tablet Generic drug:  senna-docusate Take 1 tablet by mouth as needed.   torsemide 20 MG tablet Commonly known as:  DEMADEX Take 30 mg by mouth daily. 1 and 1/2 tablet   UTI-STAT Liqd Take 30 mLs by mouth daily.       Review of Systems  Unable to perform ROS: Dementia  Constitutional: Negative for activity change, appetite change, chills, diaphoresis and fever.  HENT: Negative for congestion, mouth sores, nosebleeds, postnasal drip, sneezing, sore throat, trouble swallowing and voice change.   Respiratory: Negative for apnea, cough, choking, chest tightness, shortness of breath and wheezing.   Cardiovascular: Negative for chest pain, palpitations and leg swelling.  Gastrointestinal: Negative for abdominal distention, abdominal pain, constipation, diarrhea and nausea.  Genitourinary: Negative for difficulty urinating, dysuria, frequency and urgency.    Musculoskeletal: Positive for arthralgias (typical arthritis). Negative for back pain, gait problem and myalgias.  Skin: Negative for color change, pallor, rash and wound.  Neurological: Positive for weakness. Negative for dizziness, tremors, syncope, speech difficulty, numbness and headaches.  Psychiatric/Behavioral: Positive for agitation, behavioral problems and confusion.  All other systems reviewed and are negative.   Immunization History  Administered Date(s) Administered  . Influenza-Unspecified 11/07/2013, 11/17/2014, 11/11/2015, 11/15/2016  . PPD Test 11/24/2014, 11/27/2015  . Pneumococcal-Unspecified 01/21/2014   Pertinent  Health Maintenance Due  Topic Date Due  . FOOT EXAM  10/06/1938  . OPHTHALMOLOGY EXAM  10/06/1938  . URINE MICROALBUMIN  10/06/1938  . DEXA SCAN  10/07/1995  . HEMOGLOBIN A1C  01/26/2014  . PNA vac Low Risk Adult (2 of 2 - PCV13) 01/22/2015  . INFLUENZA VACCINE  Completed   No flowsheet data found. Functional Status Survey:    Vitals:   03/28/17 1500  BP: 120/76  Pulse: Marland Kitchen)  112  Resp: (!) 22  Temp: 97.6 F (36.4 C)  TempSrc: Oral  SpO2: 97%  Weight: 215 lb 3.2 oz (97.6 kg)  Height: 5\' 7"  (1.702 m)   Body mass index is 33.71 kg/m. Physical Exam  Constitutional: Vital signs are normal. She appears well-developed and well-nourished. She is active and cooperative. She does not appear ill. No distress.  HENT:  Head: Normocephalic and atraumatic.  Mouth/Throat: Uvula is midline, oropharynx is clear and moist and mucous membranes are normal. Mucous membranes are not pale, not dry and not cyanotic.  Eyes: Conjunctivae, EOM and lids are normal. Pupils are equal, round, and reactive to light.  Neck: Trachea normal, normal range of motion and full passive range of motion without pain. Neck supple. No JVD present. No tracheal deviation, no edema and no erythema present. No thyromegaly present.  Cardiovascular: Normal rate, regular rhythm, normal  heart sounds, intact distal pulses and normal pulses. Exam reveals no gallop, no distant heart sounds and no friction rub.  No murmur heard. Pulses:      Dorsalis pedis pulses are 2+ on the right side, and 2+ on the left side.  1+ BLE edema  Pulmonary/Chest: Effort normal and breath sounds normal. No accessory muscle usage. No respiratory distress. She has no decreased breath sounds. She has no wheezes. She has no rhonchi. She has no rales. She exhibits no tenderness.  Abdominal: Soft. Normal appearance and bowel sounds are normal. She exhibits no distension and no ascites. There is no tenderness.  Musculoskeletal: Normal range of motion. She exhibits no edema or tenderness.  Expected osteoarthritis, stiffness; Bilateral Calves soft, supple. Negative Homan's Sign. B- pedal pulses equal; generalized BLE weakness;  Neurological: She is alert. She has normal strength. Coordination and gait abnormal.  Skin: Skin is warm, dry and intact. She is not diaphoretic. No cyanosis. No pallor. Nails show no clubbing.  Psychiatric: Her speech is normal. Her mood appears anxious. She is agitated and aggressive (at times). Thought content is delusional. Cognition and memory are impaired. She expresses impulsivity and inappropriate judgment. She exhibits abnormal recent memory and abnormal remote memory.  Nursing note and vitals reviewed.   Labs reviewed: Recent Labs    04/27/16 0640  11/14/16 0655 12/06/16 0652 01/02/17 0432 01/03/17 0520 01/04/17 0440 01/05/17 0450  NA 140   < > 137 139 138  --   --   --   K 3.9   < > 3.3* 4.0 2.7* 2.9* 2.7* 3.7  CL 109   < > 103 104 100*  --   --   --   CO2 23   < > 26 26 26   --   --   --   GLUCOSE 108*   < > 109* 93 92  --   --   --   BUN 29*   < > 34* 38* 31*  --   --   --   CREATININE 1.60*   < > 1.24* 1.44* 1.32*  --   --   --   CALCIUM 8.7*   < > 8.8* 8.8* 8.6*  --   --   --   MG 2.4  --   --   --   --   --   --   --    < > = values in this interval not  displayed.   Recent Labs    11/14/16 0655 12/06/16 0652 01/02/17 0432  AST 20 16 15   ALT 16 12* 11*  ALKPHOS 58 49 57  BILITOT 0.7 0.5 0.8  PROT 5.9* 5.6* 5.7*  ALBUMIN 3.2* 2.8* 2.9*   Recent Labs    04/27/16 0640 11/01/16 0423  WBC 6.6 5.3  NEUTROABS 4.2 2.9  HGB 10.7* 13.9  HCT 31.6* 42.4  MCV 90.1 97.4  PLT 144* 147*   Lab Results  Component Value Date   TSH 2.774 04/27/2016   Lab Results  Component Value Date   HGBA1C 6.0 (H) 07/26/2013   Lab Results  Component Value Date   CHOL 142 04/27/2016   HDL 56 04/27/2016   LDLCALC 70 04/27/2016   TRIG 81 04/27/2016   CHOLHDL 2.5 04/27/2016    Significant Diagnostic Results in last 30 days:  No results found.  Assessment/Plan Ayasha was seen today for medical management of chronic issues.  Diagnoses and all orders for this visit:  Hypertensive heart disease with heart failure ([place])  Migraine without status migrainosus, not intractable, unspecified migraine type  Old myocardial infarction  Atrial fibrillation, unspecified type ([place])   Above listed conditions stable  Continue current medication regimen  Recently discharged from Hospice services for stability  Resume Palliative Care services  Monitor for return of migraine symptoms  TEDs daily for edema control  Elevate legs when at rest  Weekly and prn weights  Family/ staff Communication:   Total Time:  Documentation:  Face to Face:  Family/Phone:   Labs/tests ordered:  Not due  Medication list reviewed and assessed for continued appropriateness. Monthly medication orders reviewed and signed.  Vikki Ports, NP-C Geriatrics [facility] [phone], [place] [address] Cell Phone (Mon-Fri 8am-5pm):  [phone] On Call:  [phone] & follow prompts after 5pm & weekends Office Phone:  [phone] Office Fax:  [phone]

## 2017-04-25 ENCOUNTER — Non-Acute Institutional Stay (SKILLED_NURSING_FACILITY): Payer: Medicare Other | Admitting: Gerontology

## 2017-04-25 ENCOUNTER — Encounter: Payer: Self-pay | Admitting: Gerontology

## 2017-04-25 DIAGNOSIS — G308 Other Alzheimer's disease: Secondary | ICD-10-CM | POA: Diagnosis not present

## 2017-04-25 DIAGNOSIS — F0280 Dementia in other diseases classified elsewhere without behavioral disturbance: Secondary | ICD-10-CM

## 2017-04-25 DIAGNOSIS — F29 Unspecified psychosis not due to a substance or known physiological condition: Secondary | ICD-10-CM

## 2017-04-25 DIAGNOSIS — F0281 Dementia in other diseases classified elsewhere with behavioral disturbance: Secondary | ICD-10-CM | POA: Diagnosis not present

## 2017-04-27 ENCOUNTER — Encounter
Admission: RE | Admit: 2017-04-27 | Discharge: 2017-04-27 | Disposition: A | Discharge status: Home / Self Care | Payer: Medicare Other | Source: Ambulatory Visit | Attending: Internal Medicine | Admitting: Internal Medicine

## 2017-04-27 ENCOUNTER — Other Ambulatory Visit: Payer: Self-pay

## 2017-04-27 MED ORDER — LORAZEPAM 0.5 MG PO TABS: 0.5000 mg | ORAL_TABLET | Freq: Every day | ORAL | 3 refills | Status: DC

## 2017-04-27 MED ORDER — LORAZEPAM 0.5 MG PO TABS: 0.5000 mg | ORAL_TABLET | Freq: Four times a day (QID) | ORAL | 3 refills | Status: DC | PRN

## 2017-04-27 NOTE — Telephone Encounter (Signed)
Rx sent to [facility] phone : [phone] , fax : [phone]  

## 2017-05-28 ENCOUNTER — Encounter
Admission: RE | Admit: 2017-05-28 | Discharge: 2017-05-28 | Disposition: A | Discharge status: Home / Self Care | Payer: Medicare Other | Source: Ambulatory Visit | Attending: Internal Medicine | Admitting: Internal Medicine

## 2017-05-28 ENCOUNTER — Non-Acute Institutional Stay (SKILLED_NURSING_FACILITY): Payer: Medicare Other | Admitting: Gerontology

## 2017-05-28 DIAGNOSIS — I2510 Atherosclerotic heart disease of native coronary artery without angina pectoris: Secondary | ICD-10-CM

## 2017-05-28 DIAGNOSIS — N182 Chronic kidney disease, stage 2 (mild): Secondary | ICD-10-CM | POA: Diagnosis not present

## 2017-05-28 DIAGNOSIS — E1122 Type 2 diabetes mellitus with diabetic chronic kidney disease: Secondary | ICD-10-CM

## 2017-05-28 DIAGNOSIS — I4891 Unspecified atrial fibrillation: Secondary | ICD-10-CM | POA: Diagnosis not present

## 2017-05-28 NOTE — Assessment & Plan Note (Signed)
Stable. Very slowly progressive. Not on medication for dementia.

## 2017-05-28 NOTE — Progress Notes (Signed)
Location:      Place of Service:  SNF (31) Provider:  Toni Arthurs, NP-C  Kirk Ruths, MD  Patient Care Team: Kirk Ruths, MD as PCP - General (Internal Medicine) Toni Arthurs, NP as Nurse Practitioner (Family Medicine)  Extended Emergency Contact Information Primary Emergency Contact: [name],Molly Address: [address] [place], [place] [address] [place] Phone: [phone] Relation: Son Secondary Emergency Contact: Molly [name] Address: [address] [place], [place] [address] Home Phone: [phone] Work Phone: [phone] Relation: None  Code Status:  DNR Goals of care: Advanced Directive information Advanced Directives 04/25/2017  Does Patient Have a Medical Advance Directive? Yes  Type of Paramedic of [place];Out of facility DNR (pink MOST or yellow form);Living will  Does patient want to make changes to medical advance directive? No - Patient declined  Copy of [place] in Chart? Yes  Pre-existing out of facility DNR order (yellow form or pink MOST form) Yellow form placed in chart (order not valid for inpatient use)     Chief Complaint  Patient presents with  . Medical Management of Chronic Issues    HPI:  Pt is a 84 y.o. female seen today for medical management of chronic diseases.    Atherosclerotic heart disease of native coronary artery without angina pectoris Stable. Denies chest pain or shortness of breath. On ASA 81 mg po Q Day and Pravastatin 10 mg  [name] Stable. No recent exacerbations. HR consistently <100. Symptoms managed without Beta-blockers, etc. On Eliquis 2.5 mg po BID  [name] Stable. No longer checking blood sugars. Not on any diabetes medications. No episodes of Hypoglycemia. On Hospice Services. Medications renally adjusted as appropriate.     Past Medical History:  Diagnosis Date  .  A-fib ([place])   . Chronic CHF (congestive heart failure) ([place])   . Diabetes mellitus with stage 2 chronic kidney disease ([place])   . Hx of transient ischemic attack (TIA)   . Hyperlipidemia   . Hypertension   . Varicose veins    Past Surgical History:  Procedure Laterality Date  . APPENDECTOMY    . CHOLECYSTECTOMY    . CORONARY ARTERY BYPASS GRAFT N/A 07/27/2013   Procedure: CORONARY ARTERY BYPASS GRAFTING (CABG) POSS. MITRAL REPAIR;  Surgeon: [name], MD;  Location: [place];  Service: Open Heart Surgery;  Laterality: N/A;  Coronary artery bypass graft times four using left internal mammary artery and right saphenous leg vein using endoscope.  Marland Kitchen KNEE SURGERY      No Known Allergies  Allergies as of 05/28/2017   No Known Allergies     Medication List        Accurate as of 05/28/17  1:08 PM. Always use your most recent med list.          acetaminophen 325 MG tablet Commonly known as:  TYLENOL Take 650 mg by mouth every 4 (four) hours as needed for fever. May administer orally, per G-tube if needed or rectally if unable to swallow. Maximum dose for 24 hours is 3000 mg from all sources of Acetaminophen /Tylenol   acetaminophen 325 MG tablet Commonly known as:  TYLENOL Take 650 mg by mouth 4 (four) times daily.   ASPERCREME W/LIDOCAINE 4 % cream Generic drug:  lidocaine Apply 1 application topically 3 (three) times daily.  Apply thin layer to bilateral knees   aspirin 81 MG chewable tablet Chew 1 tablet (81 mg total) by mouth daily.   Cholecalciferol 2000 units Caps Take 1 capsule by mouth daily.   cyanocobalamin 1000 MCG tablet Take 1,000 mcg by mouth daily.   divalproex 500 MG DR tablet Commonly known as:  DEPAKOTE Take 500 mg by mouth 2 (two) times daily.   ELIQUIS 2.5 MG Tabs tablet Generic drug:  apixaban Take 2.5 mg by mouth 2 (two) times daily.   LORazepam 0.5 MG tablet Commonly known as:  ATIVAN Take 1 tablet (0.5 mg total) by mouth daily. evening  agitation. OK to hold for sedation. (Failed GDR)   LORazepam 0.5 MG tablet Commonly known as:  ATIVAN Take 1 tablet (0.5 mg total) by mouth every 6 (six) hours as needed.   Magnesium Oxide 200 MG Tabs Take 200 mg by mouth daily.   Melatonin 3 MG Tabs Take 3 mg by mouth at bedtime.   polyethylene glycol packet Commonly known as:  MIRALAX / GLYCOLAX Take 17 g by mouth daily.   potassium chloride SA 20 MEQ tablet Commonly known as:  K-DUR,KLOR-CON Take 20 mEq by mouth 2 (two) times daily.   pravastatin 10 MG tablet Commonly known as:  PRAVACHOL Take 10 mg by mouth once a week. On Sunday   PRESERVISION AREDS Tabs Take 1 tablet by mouth daily.   risperiDONE 0.5 MG tablet Commonly known as:  RISPERDAL Take 0.5 mg by mouth 2 (two) times daily.   SENNA-PLUS 8.6-50 MG tablet Generic drug:  senna-docusate Take 1 tablet by mouth as needed.   torsemide 20 MG tablet Commonly known as:  DEMADEX Take 30 mg by mouth daily. 1 and 1/2 tablet   UTI-STAT Liqd Take 30 mLs by mouth daily.       Review of Systems  Unable to perform ROS: Dementia  Constitutional: Negative for activity change, appetite change, chills, diaphoresis and fever.  HENT: Negative for congestion, mouth sores, nosebleeds, postnasal drip, sneezing, sore throat, trouble swallowing and voice change.   Respiratory: Negative for apnea, cough, choking, chest tightness, shortness of breath and wheezing.   Cardiovascular: Negative for chest pain, palpitations and leg swelling.  Gastrointestinal: Negative for abdominal distention, abdominal pain, constipation, diarrhea and nausea.  Genitourinary: Negative for difficulty urinating, dysuria, frequency and urgency.  Musculoskeletal: Negative for back pain, gait problem and myalgias. Arthralgias: typical arthritis.  Skin: Negative for color change, pallor, rash and wound.  Neurological: Negative for dizziness, tremors, syncope, speech difficulty, weakness, numbness and  headaches.  Psychiatric/Behavioral: Positive for agitation, behavioral problems, confusion and hallucinations.  All other systems reviewed and are negative.   Immunization History  Administered Date(s) Administered  . Influenza-Unspecified 11/07/2013, 11/17/2014, 11/11/2015, 11/15/2016  . PPD Test 11/24/2014, 11/27/2015  . Pneumococcal-Unspecified 01/21/2014   Pertinent  Health Maintenance Due  Topic Date Due  . FOOT EXAM  10/06/1938  . OPHTHALMOLOGY EXAM  10/06/1938  . URINE MICROALBUMIN  10/06/1938  . DEXA SCAN  10/07/1995  . HEMOGLOBIN A1C  01/26/2014  . PNA vac Low Risk Adult (2 of 2 - PCV13) 01/22/2015  . INFLUENZA VACCINE  09/27/2017   No flowsheet data found. Functional Status Survey:    Vitals:   05/23/17 2023  BP: 119/78  Pulse: 72  Resp: 20  Temp: 97.9 F (36.6 C)  SpO2: 100%  Weight: 207 lb 8 oz (94.1 kg)   Body mass index is 32.5 kg/m. Physical Exam  Constitutional: Vital signs are normal.  She appears well-developed and well-nourished. She is active and cooperative. She does not appear ill. No distress.  HENT:  Head: Normocephalic and atraumatic.  Mouth/Throat: Uvula is midline, oropharynx is clear and moist and mucous membranes are normal. Mucous membranes are not pale, not dry and not cyanotic.  Eyes: Pupils are equal, round, and reactive to light. Conjunctivae, EOM and lids are normal.  Neck: Trachea normal, normal range of motion and full passive range of motion without pain. Neck supple. No JVD present. No tracheal deviation, no edema and no erythema present. No thyromegaly present.  Cardiovascular: Normal rate, regular rhythm, normal heart sounds, intact distal pulses and normal pulses. Exam reveals no gallop, no distant heart sounds and no friction rub.  No murmur heard. Pulses:      Dorsalis pedis pulses are 2+ on the right side, and 2+ on the left side.  No edema  Pulmonary/Chest: Effort normal and breath sounds normal. No accessory muscle usage.  No respiratory distress. She has no decreased breath sounds. She has no wheezes. She has no rhonchi. She has no rales. She exhibits no tenderness.  Abdominal: Soft. Normal appearance and bowel sounds are normal. She exhibits no distension and no ascites. There is no tenderness.  Musculoskeletal: Normal range of motion. She exhibits no edema or tenderness.  Expected osteoarthritis, stiffness; Bilateral Calves soft, supple. Negative Homan's Sign. B- pedal pulses equal; Generalized weakness,   Neurological: She is alert. She has normal strength. Coordination and gait abnormal.  Skin: Skin is warm, dry and intact. She is not diaphoretic. No cyanosis. No pallor. Nails show no clubbing.  Psychiatric: Her speech is normal. Her affect is labile. She is agitated (at times), aggressive and actively hallucinating. Thought content is delusional. Cognition and memory are impaired. She expresses impulsivity and inappropriate judgment. She exhibits abnormal recent memory and abnormal remote memory.  Nursing note and vitals reviewed.   Labs reviewed: Recent Labs    11/14/16 0655 12/06/16 0652 01/02/17 0432 01/03/17 0520 01/04/17 0440 01/05/17 0450  NA 137 139 138  --   --   --   K 3.3* 4.0 2.7* 2.9* 2.7* 3.7  CL 103 104 100*  --   --   --   CO2 26 26 26   --   --   --   GLUCOSE 109* 93 92  --   --   --   BUN 34* 38* 31*  --   --   --   CREATININE 1.24* 1.44* 1.32*  --   --   --   CALCIUM 8.8* 8.8* 8.6*  --   --   --    Recent Labs    11/14/16 0655 12/06/16 0652 01/02/17 0432  AST 20 16 15   ALT 16 12* 11*  ALKPHOS 58 49 57  BILITOT 0.7 0.5 0.8  PROT 5.9* 5.6* 5.7*  ALBUMIN 3.2* 2.8* 2.9*   Recent Labs    11/01/16 0423  WBC 5.3  NEUTROABS 2.9  HGB 13.9  HCT 42.4  MCV 97.4  PLT 147*   Lab Results  Component Value Date   TSH 2.774 04/27/2016   Lab Results  Component Value Date   HGBA1C 6.0 (H) 07/26/2013   Lab Results  Component Value Date   CHOL 142 04/27/2016   HDL 56  04/27/2016   LDLCALC 70 04/27/2016   TRIG 81 04/27/2016   CHOLHDL 2.5 04/27/2016    Significant Diagnostic Results in last 30 days:  No results found.  Assessment/Plan Molly [name] was  seen today for medical management of chronic issues.  Diagnoses and all orders for this visit:  Atherosclerosis of native coronary artery of native heart without angina pectoris  Atrial fibrillation, unspecified type ([place])  Type 2 diabetes mellitus with stage 2 chronic kidney disease, without long-term current use of insulin ([place])   Above listed conditions stable  Continue current medications  Monitor for hypoglycemia  Monitor for tachycardia, irregular heart beat  Renally adjust medications as appropriate  Safety precautions   Fall precautions   Family/ staff Communication:   Total Time:  Documentation:  Face to Face:  Family/Phone:   Labs/tests ordered:  Not due, on Hospice services  Medication list reviewed and assessed for continued appropriateness. Monthly medication orders reviewed and signed.  Vikki Ports, NP-C Geriatrics [facility] [phone], [place] [address] Cell Phone (Mon-Fri 8am-5pm):  [phone] On Call:  [phone] & follow prompts after 5pm & weekends Office Phone:  [phone] Office Fax:  [phone]

## 2017-05-28 NOTE — Assessment & Plan Note (Signed)
Stable. No longer checking blood sugars. Not on any diabetes medications. No episodes of Hypoglycemia. On Hospice Services. Medications renally adjusted as appropriate.

## 2017-05-28 NOTE — Progress Notes (Signed)
Location:    Nursing Home Room Number: 310A Place of Service:  SNF (31) Provider:  Toni Arthurs, NP-C  Kirk Ruths, MD  Patient Care Team: Kirk Ruths, MD as PCP - General (Internal Medicine) Toni Arthurs, NP as Nurse Practitioner (Family Medicine)  Extended Emergency Contact Information Primary Emergency Contact: Coble,Dean Address: [address] [place], [place] [address] [place] Phone: [phone] Relation: Son Secondary Emergency Contact: Jolaine Click Address: [address] [place], [place] [address] Home Phone: [phone] Work Phone: [phone] Relation: None  Code Status:  DNR Goals of care: Advanced Directive information Advanced Directives 04/25/2017  Does Patient Have a Medical Advance Directive? Yes  Type of Paramedic of [place];Out of facility DNR (pink MOST or yellow form);Living will  Does patient want to make changes to medical advance directive? No - Patient declined  Copy of [place] in Chart? Yes  Pre-existing out of facility DNR order (yellow form or pink MOST form) Yellow form placed in chart (order not valid for inpatient use)     Chief Complaint  Patient presents with  . Medical Management of Chronic Issues    Routine Visit    HPI:  Pt is a 84 y.o. female seen today for medical management of chronic diseases.    Other Alzheimer's disease Stable. Very slowly progressive. Not on medication for dementia.   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 Very slowly progressive. Daily behaviors of yelling out incessantly, trying to get out of the chair, intermittently combative with staff, intermittently combative with other residents. On Risperdal 0.5 mg po BID, Ativan 0.5 mg po Q Day at 1600 for evening agitation and ativan Q 6 hours prn for agitation, aggression, anxiety, etc. Melatonin 3 mg Q Day. Depakote 500 mg po BID  also given for mood stabilizatoin.  Unspecified psychosis not due to a substance or known physiological condition Stable. Intermittent but infrequent episodes of agitation, combativeness. Symptoms now controlled with Risperdal 0.5 mg po BID, scheduled daily ativan and prn ativan, Depakote 500 mg BID. Continues to yell out, yelling for her son daily. But, no longer aggressive to other residents or staff, as of late.     Past Medical History:  Diagnosis Date  . A-fib ([place])   . Chronic CHF (congestive heart failure) ([place])   . Diabetes mellitus with stage 2 chronic kidney disease ([place])   . Hx of transient ischemic attack (TIA)   . Hyperlipidemia   . Hypertension   . Varicose veins    Past Surgical History:  Procedure Laterality Date  . APPENDECTOMY    . CHOLECYSTECTOMY    . CORONARY ARTERY BYPASS GRAFT N/A 07/27/2013   Procedure: CORONARY ARTERY BYPASS GRAFTING (CABG) POSS. MITRAL REPAIR;  Surgeon: [name], MD;  Location: [facility];  Service: Open Heart Surgery;  Laterality: N/A;  Coronary artery bypass graft times four using left internal mammary artery and right saphenous leg vein using endoscope.  Marland Kitchen KNEE SURGERY      No Known Allergies  Allergies as of 04/25/2017   No Known Allergies     Medication List        Accurate as of 04/25/17 11:59 PM. Always use your most recent med list.          acetaminophen 325 MG tablet Commonly known as:  TYLENOL Take 650 mg by  mouth every 4 (four) hours as needed for fever. May administer orally, per G-tube if needed or rectally if unable to swallow. Maximum dose for 24 hours is 3000 mg from all sources of Acetaminophen /Tylenol   acetaminophen 325 MG tablet Commonly known as:  TYLENOL Take 650 mg by mouth 4 (four) times daily.   ASPERCREME W/LIDOCAINE 4 % cream Generic drug:  lidocaine Apply 1 application topically 3 (three) times daily. Apply thin layer to bilateral knees   aspirin 81 MG chewable tablet Chew 1 tablet (81 mg  total) by mouth daily.   Cholecalciferol 2000 units Caps Take 1 capsule by mouth daily.   cyanocobalamin 1000 MCG tablet Take 1,000 mcg by mouth daily.   divalproex 500 MG DR tablet Commonly known as:  DEPAKOTE Take 500 mg by mouth 2 (two) times daily.   ELIQUIS 2.5 MG Tabs tablet Generic drug:  apixaban Take 2.5 mg by mouth 2 (two) times daily.   LORazepam 0.5 MG tablet Commonly known as:  ATIVAN Take 0.5 mg by mouth daily. evening agitation. OK to hold for sedation. (Failed GDR)   LORazepam 0.5 MG tablet Commonly known as:  ATIVAN Take 0.5 mg by mouth every 6 (six) hours as needed.   Magnesium Oxide 200 MG Tabs Take 200 mg by mouth daily.   Melatonin 3 MG Tabs Take 3 mg by mouth at bedtime.   polyethylene glycol packet Commonly known as:  MIRALAX / GLYCOLAX Take 17 g by mouth daily.   potassium chloride SA 20 MEQ tablet Commonly known as:  K-DUR,KLOR-CON Take 20 mEq by mouth 2 (two) times daily.   pravastatin 10 MG tablet Commonly known as:  PRAVACHOL Take 10 mg by mouth once a week. On Sunday   PRESERVISION AREDS Tabs Take 1 tablet by mouth daily.   risperiDONE 0.5 MG tablet Commonly known as:  RISPERDAL Take 0.5 mg by mouth 2 (two) times daily.   SENNA-PLUS 8.6-50 MG tablet Generic drug:  senna-docusate Take 1 tablet by mouth as needed.   torsemide 20 MG tablet Commonly known as:  DEMADEX Take 30 mg by mouth daily. 1 and 1/2 tablet   UTI-STAT Liqd Take 30 mLs by mouth daily.       Review of Systems  Unable to perform ROS: Dementia  Constitutional: Negative for activity change, appetite change, chills, diaphoresis and fever.  HENT: Negative for congestion, mouth sores, nosebleeds, postnasal drip, sneezing, sore throat, trouble swallowing and voice change.   Respiratory: Negative for apnea, cough, choking, chest tightness, shortness of breath and wheezing.   Cardiovascular: Negative for chest pain, palpitations and leg swelling.    Gastrointestinal: Negative for abdominal distention, abdominal pain, constipation, diarrhea and nausea.  Genitourinary: Negative for difficulty urinating, dysuria, frequency and urgency.  Musculoskeletal: Positive for arthralgias (typical arthritis). Negative for back pain, gait problem and myalgias.  Skin: Negative for color change, pallor, rash and wound.  Neurological: Positive for weakness. Negative for dizziness, tremors, syncope, speech difficulty, numbness and headaches.  Psychiatric/Behavioral: Positive for agitation, behavioral problems, confusion, dysphoric mood and hallucinations.  All other systems reviewed and are negative.   Immunization History  Administered Date(s) Administered  . Influenza-Unspecified 11/07/2013, 11/17/2014, 11/11/2015, 11/15/2016  . PPD Test 11/24/2014, 11/27/2015  . Pneumococcal-Unspecified 01/21/2014   Pertinent  Health Maintenance Due  Topic Date Due  . FOOT EXAM  10/06/1938  . OPHTHALMOLOGY EXAM  10/06/1938  . URINE MICROALBUMIN  10/06/1938  . DEXA SCAN  10/07/1995  . HEMOGLOBIN A1C  01/26/2014  .  PNA vac Low Risk Adult (2 of 2 - PCV13) 01/22/2015  . INFLUENZA VACCINE  09/27/2017   No flowsheet data found. Functional Status Survey:    Vitals:   04/25/17 1437  BP: (!) 123/56  Pulse: 89  Resp: 18  Temp: 97.9 F (36.6 C)  TempSrc: Oral  SpO2: 96%  Weight: 212 lb 8 oz (96.4 kg)  Height: 5\' 7"  (1.702 m)   Body mass index is 33.28 kg/m. Physical Exam  Constitutional: Vital signs are normal. She appears well-developed and well-nourished. She is active and cooperative. She does not appear ill. No distress.  HENT:  Head: Normocephalic and atraumatic.  Mouth/Throat: Uvula is midline, oropharynx is clear and moist and mucous membranes are normal. Mucous membranes are not pale, not dry and not cyanotic.  Eyes: Pupils are equal, round, and reactive to light. Conjunctivae, EOM and lids are normal.  Neck: Trachea normal, normal range of  motion and full passive range of motion without pain. Neck supple. No JVD present. No tracheal deviation, no edema and no erythema present. No thyromegaly present.  Cardiovascular: Normal rate, regular rhythm, normal heart sounds, intact distal pulses and normal pulses. Exam reveals no gallop, no distant heart sounds and no friction rub.  No murmur heard. Pulses:      Dorsalis pedis pulses are 2+ on the right side, and 2+ on the left side.  No edema  Pulmonary/Chest: Effort normal and breath sounds normal. No accessory muscle usage. No respiratory distress. She has no decreased breath sounds. She has no wheezes. She has no rhonchi. She has no rales. She exhibits no tenderness.  Abdominal: Soft. Normal appearance and bowel sounds are normal. She exhibits no distension and no ascites. There is no tenderness.  Musculoskeletal: Normal range of motion. She exhibits no edema or tenderness.  Expected osteoarthritis, stiffness; Bilateral Calves soft, supple. Negative Homan's Sign. B- pedal pulses equal; generalized weakness  Neurological: She is alert. She has normal strength. A cranial nerve deficit and sensory deficit is present. She exhibits abnormal muscle tone. Coordination and gait abnormal.  Skin: Skin is warm, dry and intact. She is not diaphoretic. No cyanosis. No pallor. Nails show no clubbing.  Psychiatric: Her speech is normal. Her affect is blunt and labile. She is agitated (at times) and aggressive. Thought content is delusional. Cognition and memory are impaired. She expresses impulsivity and inappropriate judgment. She exhibits abnormal recent memory and abnormal remote memory.  Nursing note and vitals reviewed.   Labs reviewed: Recent Labs    11/14/16 0655 12/06/16 0652 01/02/17 0432 01/03/17 0520 01/04/17 0440 01/05/17 0450  NA 137 139 138  --   --   --   K 3.3* 4.0 2.7* 2.9* 2.7* 3.7  CL 103 104 100*  --   --   --   CO2 26 26 26   --   --   --   GLUCOSE 109* 93 92  --   --   --    BUN 34* 38* 31*  --   --   --   CREATININE 1.24* 1.44* 1.32*  --   --   --   CALCIUM 8.8* 8.8* 8.6*  --   --   --    Recent Labs    11/14/16 0655 12/06/16 0652 01/02/17 0432  AST 20 16 15   ALT 16 12* 11*  ALKPHOS 58 49 57  BILITOT 0.7 0.5 0.8  PROT 5.9* 5.6* 5.7*  ALBUMIN 3.2* 2.8* 2.9*   Recent Labs  11/01/16 0423  WBC 5.3  NEUTROABS 2.9  HGB 13.9  HCT 42.4  MCV 97.4  PLT 147*   Lab Results  Component Value Date   TSH 2.774 04/27/2016   Lab Results  Component Value Date   HGBA1C 6.0 (H) 07/26/2013   Lab Results  Component Value Date   CHOL 142 04/27/2016   HDL 56 04/27/2016   LDLCALC 70 04/27/2016   TRIG 81 04/27/2016   CHOLHDL 2.5 04/27/2016    Significant Diagnostic Results in last 30 days:  No results found.  Assessment/Plan Aleka was seen today for medical management of chronic issues.  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 Monthly medication orders reviewed and signed.  Vikki Ports, NP-C Geriatrics [facility] [phone], [place] [address] Cell Phone (Mon-Fri 8am-5pm):  [phone] On Call:  [phone] & follow prompts after 5pm & weekends Office Phone:  [phone] Office Fax:  [phone]

## 2017-05-28 NOTE — Assessment & Plan Note (Addendum)
Stable. No recent exacerbations. HR consistently <100. Symptoms managed without Beta-blockers, etc. On Eliquis 2.5 mg po BID

## 2017-05-28 NOTE — Assessment & Plan Note (Signed)
Stable. Intermittent but infrequent episodes of agitation, combativeness. Symptoms now controlled with Risperdal 0.5 mg po BID, scheduled daily ativan and prn ativan, Depakote 500 mg BID. Continues to yell out, yelling for her son daily. But, no longer aggressive to other residents or staff, as of late.

## 2017-05-28 NOTE — Assessment & Plan Note (Signed)
Stable. Very slowly progressive. Daily behaviors of yelling out incessantly, trying to get out of the chair, intermittently combative with staff, intermittently combative with other residents. On Risperdal 0.5 mg po BID, Ativan 0.5 mg po Q Day at 1600 for evening agitation and ativan Q 6 hours prn for agitation, aggression, anxiety, etc. Melatonin 3 mg Q Day. Depakote 500 mg po BID also given for mood stabilizatoin.

## 2017-05-28 NOTE — Assessment & Plan Note (Signed)
Stable. Denies chest pain or shortness of breath. On ASA 81 mg po Q Day and Pravastatin 10 mg

## 2017-06-14 ENCOUNTER — Other Ambulatory Visit: Payer: Self-pay

## 2017-06-14 MED ORDER — TRAMADOL HCL 50 MG PO TABS: 50.0000 mg | ORAL_TABLET | Freq: Every day | ORAL | 2 refills | Status: DC

## 2017-06-14 NOTE — Telephone Encounter (Signed)
Rx sent to [facility] phone : [phone] , fax : [phone]  

## 2017-06-26 ENCOUNTER — Non-Acute Institutional Stay (SKILLED_NURSING_FACILITY): Payer: Medicare Other | Admitting: Gerontology

## 2017-06-26 DIAGNOSIS — Z8673 Personal history of transient ischemic attack (TIA), and cerebral infarction without residual deficits: Secondary | ICD-10-CM

## 2017-06-26 DIAGNOSIS — Z955 Presence of coronary angioplasty implant and graft: Secondary | ICD-10-CM

## 2017-06-26 DIAGNOSIS — F29 Unspecified psychosis not due to a substance or known physiological condition: Secondary | ICD-10-CM | POA: Diagnosis not present

## 2017-06-26 DIAGNOSIS — Z951 Presence of aortocoronary bypass graft: Secondary | ICD-10-CM | POA: Diagnosis not present

## 2017-06-26 NOTE — Assessment & Plan Note (Signed)
Stable.  No recent exacerbations.  Patient continues on Eliquis 2.5 mg p.o. twice daily

## 2017-06-26 NOTE — Progress Notes (Signed)
Location:      Place of Service:  SNF (31) Provider:  Toni Arthurs, NP-C  Kirk Ruths, MD  Patient Care Team: Kirk Ruths, MD as PCP - General (Internal Medicine) Toni Arthurs, NP as Nurse Practitioner (Family Medicine)  Extended Emergency Contact Information Primary Emergency Contact: Coble,Dean Address: [address], [place] [address] [place] Phone: [phone] Relation: Son Secondary Emergency Contact: Jolaine Click Address: [address], [place] [address] Home Phone: [phone] Work Phone: [phone] Relation: None  Code Status:  DNR Goals of care: Advanced Directive information Advanced Directives 04/25/2017  Does Patient Have a Medical Advance Directive? Yes  Type of Paramedic of [place];Out of facility DNR (pink MOST or yellow form);Living will  Does patient want to make changes to medical advance directive? No - Patient declined  Copy of [place] in Chart? Yes  Pre-existing out of facility DNR order (yellow form or pink MOST form) Yellow form placed in chart (order not valid for inpatient use)     Chief Complaint  Patient presents with  . Medical Management of Chronic Issues    HPI:  Pt is a 84 y.o. female seen today for medical management of chronic diseases.    Presence of coronary artery bypass graft stent Stable.  Patient is only on torsemide 30 mg p.o. daily with potassium chloride 20 mEq p.o. twice daily for edema.  Patient is also on aspirin 81 mg p.o. daily.  Denies chest pain or shortness of breath  Personal history of transient ischemic attack (TIA), and cerebral infarction without residual deficits Stable.  No recent exacerbations.  Patient continues on Eliquis 2.5 mg p.o. twice daily  Unspecified psychosis not due to a substance or known physiological condition Stable.  Symptoms managed with Risperdal 0.5 mg p.o. twice daily.  Brief GDR  attempted with worsening of symptoms of yelling out and some aggression towards other residents.  Dose increased to baseline of 0.5 mg twice daily.  Patient does continue to have intermittent yelling out for her son, but is otherwise pleasant, interactive, and not aggressive towards other residents.  Another trial GDR of the Risperdal must be done per facility administration.  Patient will continue at this time on the Depakote 500 mg p.o. twice daily and lorazepam 0.5 mg p.o. twice daily.  Please note pt with limited verbal/cognitive ability. Unable to obtain complete ROS. Some ROS info obtained from staff and documentation.   Past Medical History:  Diagnosis Date  . A-fib ([place])   . Chronic CHF (congestive heart failure) ([place])   . Diabetes mellitus with stage 2 chronic kidney disease ([place])   . Hx of transient ischemic attack (TIA)   . Hyperlipidemia   . Hypertension   . Varicose veins    Past Surgical History:  Procedure Laterality Date  . APPENDECTOMY    . CHOLECYSTECTOMY    . CORONARY ARTERY BYPASS GRAFT N/A 07/27/2013   Procedure: CORONARY ARTERY BYPASS GRAFTING (CABG) POSS. MITRAL REPAIR;  Surgeon: [name], MD;  Location: [facility];  Service: Open Heart Surgery;  Laterality: N/A;  Coronary artery bypass graft times four using left internal mammary artery and right saphenous leg vein using endoscope.  Marland Kitchen KNEE SURGERY      No Known Allergies  Allergies as of 06/26/2017   No Known Allergies     Medication List  Accurate as of 06/26/17  5:20 PM. Always use your most recent med list.          acetaminophen 325 MG tablet Commonly known as:  TYLENOL Take 650 mg by mouth every 4 (four) hours as needed for fever. May administer orally, per G-tube if needed or rectally if unable to swallow. Maximum dose for 24 hours is 3000 mg from all sources of Acetaminophen /Tylenol   acetaminophen 325 MG tablet Commonly known as:  TYLENOL Take 650 mg by mouth 4 (four) times  daily.   ASPERCREME W/LIDOCAINE 4 % cream Generic drug:  lidocaine Apply 1 application topically 3 (three) times daily. Apply thin layer to bilateral knees   aspirin 81 MG chewable tablet Chew 1 tablet (81 mg total) by mouth daily.   Cholecalciferol 2000 units Caps Take 1 capsule by mouth daily.   cyanocobalamin 1000 MCG tablet Take 1,000 mcg by mouth daily.   divalproex 500 MG DR tablet Commonly known as:  DEPAKOTE Take 500 mg by mouth 2 (two) times daily.   ELIQUIS 2.5 MG Tabs tablet Generic drug:  apixaban Take 2.5 mg by mouth 2 (two) times daily.   LORazepam 0.5 MG tablet Commonly known as:  ATIVAN Take 0.5 mg by mouth 2 (two) times daily. RE: Psychosis, aggression . OK to hold for sedation. (Failed GDR)   Magnesium Oxide 200 MG Tabs Take 200 mg by mouth daily.   Melatonin 3 MG Tabs Take 3 mg by mouth at bedtime.   polyethylene glycol packet Commonly known as:  MIRALAX / GLYCOLAX Take 17 g by mouth daily.   potassium chloride SA 20 MEQ tablet Commonly known as:  K-DUR,KLOR-CON Take 20 mEq by mouth 2 (two) times daily.   pravastatin 10 MG tablet Commonly known as:  PRAVACHOL Take 10 mg by mouth once a week. On Sunday   PRESERVISION AREDS Tabs Take 1 tablet by mouth daily.   risperiDONE 0.5 MG tablet Commonly known as:  RISPERDAL Take 0.5 mg by mouth 2 (two) times daily. for psychosis- 1 tablet in the AM, 1 tablet in the PM (Failed GDR 06/20/17)   SENNA-PLUS 8.6-50 MG tablet Generic drug:  senna-docusate Take 1 tablet by mouth 2 (two) times daily as needed.   torsemide 20 MG tablet Commonly known as:  DEMADEX Take 30 mg by mouth daily. 1 and 1/2 tablet   traMADol 50 MG tablet Commonly known as:  ULTRAM Take 1 tablet (50 mg total) by mouth at bedtime.   UTI-STAT Liqd Take 30 mLs by mouth daily.       Review of Systems  Unable to perform ROS: Dementia  Constitutional: Negative for activity change, appetite change, chills, diaphoresis and fever.   HENT: Negative for congestion, mouth sores, nosebleeds, postnasal drip, sneezing, sore throat, trouble swallowing and voice change.   Respiratory: Negative for apnea, cough, choking, chest tightness, shortness of breath and wheezing.   Cardiovascular: Negative for chest pain, palpitations and leg swelling.  Gastrointestinal: Negative for abdominal distention, abdominal pain, constipation, diarrhea and nausea.  Genitourinary: Negative for difficulty urinating, dysuria, frequency and urgency.  Musculoskeletal: Positive for arthralgias (typical arthritis). Negative for back pain, gait problem and myalgias.  Skin: Negative for color change, pallor, rash and wound.  Neurological: Negative for dizziness, tremors, syncope, speech difficulty, weakness, numbness and headaches.  Psychiatric/Behavioral: Positive for agitation, behavioral problems (at times), confusion and dysphoric mood.  All other systems reviewed and are negative.   Immunization History  Administered Date(s) Administered  . Influenza-Unspecified  11/07/2013, 11/17/2014, 11/11/2015, 11/15/2016  . PPD Test 11/24/2014, 11/27/2015  . Pneumococcal-Unspecified 01/21/2014   Pertinent  Health Maintenance Due  Topic Date Due  . FOOT EXAM  10/06/1938  . OPHTHALMOLOGY EXAM  10/06/1938  . URINE MICROALBUMIN  [birth year]06/1938  . DEXA SCAN  10/07/1995  . HEMOGLOBIN A1C  01/26/2014  . PNA vac Low Risk Adult (2 of 2 - PCV13) 01/22/2015  . INFLUENZA VACCINE  09/27/2017   No flowsheet data found. Functional Status Survey:    Vitals:   06/20/17 2000  BP: 112/73  Pulse: 88  Resp: 20  Temp: 98.3 F (36.8 C)  SpO2: 100%  Weight: 210 lb 11.2 oz (95.6 kg)   Body mass index is 33 kg/m. Physical Exam  Constitutional: Vital signs are normal. She appears well-developed and well-nourished. She is active and cooperative. She does not appear ill. No distress.  HENT:  Head: Normocephalic and atraumatic.  Mouth/Throat: Uvula is midline, oropharynx  is clear and moist and mucous membranes are normal. Mucous membranes are not pale, not dry and not cyanotic.  Eyes: Pupils are equal, round, and reactive to light. Conjunctivae, EOM and lids are normal.  Neck: Trachea normal, normal range of motion and full passive range of motion without pain. Neck supple. No JVD present. No tracheal deviation, no edema and no erythema present. No thyromegaly present.  Cardiovascular: Normal rate, regular rhythm, normal heart sounds, intact distal pulses and normal pulses. Exam reveals no gallop, no distant heart sounds and no friction rub.  No murmur heard. Pulses:      Dorsalis pedis pulses are 2+ on the right side, and 2+ on the left side.  No edema  Pulmonary/Chest: Effort normal and breath sounds normal. No accessory muscle usage. No respiratory distress. She has no decreased breath sounds. She has no wheezes. She has no rhonchi. She has no rales. She exhibits no tenderness.  Abdominal: Soft. Normal appearance and bowel sounds are normal. She exhibits no distension and no ascites. There is no tenderness.  Musculoskeletal: Normal range of motion. She exhibits no edema or tenderness.  Expected osteoarthritis, stiffness; Bilateral Calves soft, supple. Negative Homan's Sign. B- pedal pulses equal; generalized weakness  Neurological: She is alert. She has normal strength. A sensory deficit is present. She exhibits abnormal muscle tone. Gait abnormal.  Skin: Skin is warm, dry and intact. She is not diaphoretic. No cyanosis. No pallor. Nails show no clubbing.  Psychiatric: Thought content normal. Her affect is angry (at times). Her speech is tangential. She is agitated and aggressive (at times). Cognition and memory are impaired. She expresses impulsivity and inappropriate judgment. She exhibits abnormal recent memory and abnormal remote memory.  Nursing note and vitals reviewed.   Labs reviewed: Recent Labs    11/14/16 0655 12/06/16 0652 01/02/17 0432  01/03/17 0520 01/04/17 0440 01/05/17 0450  NA 137 139 138  --   --   --   K 3.3* 4.0 2.7* 2.9* 2.7* 3.7  CL 103 104 100*  --   --   --   CO2 26 26 26   --   --   --   GLUCOSE 109* 93 92  --   --   --   BUN 34* 38* 31*  --   --   --   CREATININE 1.24* 1.44* 1.32*  --   --   --   CALCIUM 8.8* 8.8* 8.6*  --   --   --    Recent Labs    11/14/16 [phone]  12/06/16 0652 01/02/17 0432  AST 20 16 15   ALT 16 12* 11*  ALKPHOS 58 49 57  BILITOT 0.7 0.5 0.8  PROT 5.9* 5.6* 5.7*  ALBUMIN 3.2* 2.8* 2.9*   Recent Labs    11/01/16 0423  WBC 5.3  NEUTROABS 2.9  HGB 13.9  HCT 42.4  MCV 97.4  PLT 147*   Lab Results  Component Value Date   TSH 2.774 04/27/2016   Lab Results  Component Value Date   HGBA1C 6.0 (H) 07/26/2013   Lab Results  Component Value Date   CHOL 142 04/27/2016   HDL 56 04/27/2016   LDLCALC 70 04/27/2016   TRIG 81 04/27/2016   CHOLHDL 2.5 04/27/2016    Significant Diagnostic Results in last 30 days:  No results found.  Assessment/Plan [place] was seen today for medical management of chronic issues.  Diagnoses and all orders for this visit:  Presence of coronary artery bypass graft stent  Personal history of transient ischemic attack (TIA), and cerebral infarction without residual deficits  Unspecified psychosis not due to a substance or known physiological condition ([place])   Above listed condition stable  Continue current medication regimen, except  Decrease risperidone to 0.25 mg p.o. twice daily x7 days, then  Further decrease risperidone to 0.25 mg p.o. once daily x7 days, then DC despite failed GDR 06/20/2017 per facility administration  Continue to attempt to redirect patient as necessary  Separate patient from other residents if she becomes aggressive  Diversional or distraction therapies  Continue to monitor for abnormal bleeding  Patient to continue to be followed by Palliative medicine  Family/ staff Communication:  Total  Time:  Documentation:  Face to Face:  Family/Phone:   Labs/tests ordered: CBC, met C, magnesium, TSH, B12, D, free Depakote level  Medication list reviewed and assessed for continued appropriateness. Monthly medication orders reviewed and signed.  Vikki Ports, NP-C Geriatrics [facility] [phone], [place] [address] Cell Phone (Mon-Fri 8am-5pm):  [phone] On Call:  [phone] & follow prompts after 5pm & weekends Office Phone:  [phone] Office Fax:  [phone]

## 2017-06-26 NOTE — Assessment & Plan Note (Signed)
Stable.  Symptoms managed with Risperdal 0.5 mg p.o. twice daily.  Brief GDR attempted with worsening of symptoms of yelling out and some aggression towards other residents.  Dose increased to baseline of 0.5 mg twice daily.  Patient does continue to have intermittent yelling out for her son, but is otherwise pleasant, interactive, and not aggressive towards other residents.  Another trial GDR of the Risperdal must be done per facility administration.  Patient will continue at this time on the Depakote 500 mg p.o. twice daily and lorazepam 0.5 mg p.o. twice daily.

## 2017-06-26 NOTE — Assessment & Plan Note (Signed)
Stable.  Patient is only on torsemide 30 mg p.o. daily with potassium chloride 20 mEq p.o. twice daily for edema.  Patient is also on aspirin 81 mg p.o. daily.  Denies chest pain or shortness of breath

## 2017-06-27 ENCOUNTER — Encounter
Admission: RE | Admit: 2017-06-27 | Discharge: 2017-06-27 | Disposition: A | Discharge status: Home / Self Care | Payer: Medicare Other | Source: Ambulatory Visit | Attending: Internal Medicine | Admitting: Internal Medicine

## 2017-06-28 ENCOUNTER — Other Ambulatory Visit
Admission: RE | Admit: 2017-06-28 | Discharge: 2017-06-28 | Disposition: A | Discharge status: Home / Self Care | Payer: Medicare Other | Source: Ambulatory Visit | Attending: Gerontology | Admitting: Gerontology

## 2017-06-28 DIAGNOSIS — F29 Unspecified psychosis not due to a substance or known physiological condition: Secondary | ICD-10-CM | POA: Insufficient documentation

## 2017-06-28 LAB — CBC WITH DIFFERENTIAL/PLATELET
BASOS PCT: 0 %
Basophils Absolute: 0 10*3/uL (ref 0–0.1)
EOS ABS: 0.3 10*3/uL (ref 0–0.7)
Eosinophils Relative: 5 %
HCT: 34.1 % — ABNORMAL LOW (ref 35.0–47.0)
HEMOGLOBIN: 11.4 g/dL — AB (ref 12.0–16.0)
LYMPHS ABS: 2.3 10*3/uL (ref 1.0–3.6)
Lymphocytes Relative: 38 %
MCH: 32.9 pg (ref 26.0–34.0)
MCHC: 33.4 g/dL (ref 32.0–36.0)
MCV: 98.5 fL (ref 80.0–100.0)
Monocytes Absolute: 0.6 10*3/uL (ref 0.2–0.9)
Monocytes Relative: 10 %
Neutro Abs: 2.8 10*3/uL (ref 1.4–6.5)
Neutrophils Relative %: 47 %
Platelets: 165 10*3/uL (ref 150–440)
RBC: 3.47 MIL/uL — AB (ref 3.80–5.20)
RDW: 14.1 % (ref 11.5–14.5)
WBC: 5.9 10*3/uL (ref 3.6–11.0)

## 2017-06-28 LAB — COMPREHENSIVE METABOLIC PANEL
ALBUMIN: 3.5 g/dL (ref 3.5–5.0)
ALK PHOS: 56 U/L (ref 38–126)
ALT: 12 U/L — AB (ref 14–54)
AST: 15 U/L (ref 15–41)
Anion gap: 9 (ref 5–15)
BUN: 35 mg/dL — ABNORMAL HIGH (ref 6–20)
CALCIUM: 8.9 mg/dL (ref 8.9–10.3)
CO2: 29 mmol/L (ref 22–32)
CREATININE: 1.11 mg/dL — AB (ref 0.44–1.00)
Chloride: 102 mmol/L (ref 101–111)
GFR calc Af Amer: 51 mL/min — ABNORMAL LOW (ref >60)
GFR calc non Af Amer: 44 mL/min — ABNORMAL LOW (ref >60)
GLUCOSE: 85 mg/dL (ref 65–99)
Potassium: 3.6 mmol/L (ref 3.5–5.1)
SODIUM: 140 mmol/L (ref 135–145)
Total Bilirubin: 0.7 mg/dL (ref 0.3–1.2)
Total Protein: 6 g/dL — ABNORMAL LOW (ref 6.5–8.1)

## 2017-06-28 LAB — MAGNESIUM: Magnesium: 2 mg/dL (ref 1.7–2.4)

## 2017-06-28 LAB — VITAMIN B12: Vitamin B-12: 632 pg/mL (ref 180–914)

## 2017-06-28 LAB — TSH: TSH: 3.676 u[IU]/mL (ref 0.350–4.500)

## 2017-06-29 LAB — VITAMIN D 25 HYDROXY (VIT D DEFICIENCY, FRACTURES): VIT D 25 HYDROXY: 41 ng/mL (ref 30.0–100.0)

## 2017-06-29 LAB — VALPROIC ACID LEVEL, FREE: VALPROIC ACID FREE: 5.9 ug/mL — AB (ref 6.0–22.0)

## 2017-07-04 ENCOUNTER — Other Ambulatory Visit: Payer: Self-pay

## 2017-07-04 MED ORDER — TRAMADOL HCL 50 MG PO TABS: 50.0000 mg | ORAL_TABLET | Freq: Three times a day (TID) | ORAL | 2 refills | Status: DC

## 2017-07-04 NOTE — Telephone Encounter (Signed)
Rx sent to [facility] phone : [phone] , fax : [phone]  

## 2017-07-16 ENCOUNTER — Other Ambulatory Visit: Payer: Self-pay

## 2017-07-16 MED ORDER — OXYCODONE HCL 5 MG PO TABS: 5.0000 mg | ORAL_TABLET | Freq: Four times a day (QID) | ORAL | 0 refills | Status: DC

## 2017-07-16 MED ORDER — LORAZEPAM 0.5 MG PO TABS: 0.5000 mg | ORAL_TABLET | Freq: Three times a day (TID) | ORAL | 1 refills | Status: AC

## 2017-07-16 NOTE — Telephone Encounter (Signed)
Rx sent to [facility] phone : [phone] , fax : [phone]  

## 2017-07-20 ENCOUNTER — Encounter: Payer: Self-pay | Admitting: Gerontology

## 2017-07-20 ENCOUNTER — Non-Acute Institutional Stay (SKILLED_NURSING_FACILITY): Payer: Medicare Other | Admitting: Gerontology

## 2017-07-20 DIAGNOSIS — R001 Bradycardia, unspecified: Secondary | ICD-10-CM | POA: Diagnosis not present

## 2017-07-20 DIAGNOSIS — E538 Deficiency of other specified B group vitamins: Secondary | ICD-10-CM

## 2017-07-20 DIAGNOSIS — E785 Hyperlipidemia, unspecified: Secondary | ICD-10-CM | POA: Diagnosis not present

## 2017-07-28 ENCOUNTER — Encounter
Admission: RE | Admit: 2017-07-28 | Discharge: 2017-07-28 | Disposition: A | Discharge status: Home / Self Care | Payer: Medicare Other | Source: Ambulatory Visit | Attending: Internal Medicine | Admitting: Internal Medicine

## 2017-08-07 ENCOUNTER — Other Ambulatory Visit
Admission: RE | Admit: 2017-08-07 | Discharge: 2017-08-07 | Disposition: A | Discharge status: Home / Self Care | Payer: Medicare Other | Source: Ambulatory Visit | Attending: Gerontology | Admitting: Gerontology

## 2017-08-07 ENCOUNTER — Non-Acute Institutional Stay (SKILLED_NURSING_FACILITY): Payer: Medicare Other | Admitting: Gerontology

## 2017-08-07 ENCOUNTER — Encounter: Payer: Self-pay | Admitting: Gerontology

## 2017-08-07 DIAGNOSIS — F0281 Dementia in other diseases classified elsewhere with behavioral disturbance: Secondary | ICD-10-CM | POA: Insufficient documentation

## 2017-08-07 DIAGNOSIS — R4182 Altered mental status, unspecified: Secondary | ICD-10-CM

## 2017-08-07 DIAGNOSIS — N189 Chronic kidney disease, unspecified: Secondary | ICD-10-CM | POA: Diagnosis not present

## 2017-08-07 DIAGNOSIS — E870 Hyperosmolality and hypernatremia: Secondary | ICD-10-CM

## 2017-08-07 DIAGNOSIS — N179 Acute kidney failure, unspecified: Secondary | ICD-10-CM

## 2017-08-07 LAB — COMPREHENSIVE METABOLIC PANEL
ALT: 26 U/L (ref 14–54)
AST: 24 U/L (ref 15–41)
Albumin: 3.7 g/dL (ref 3.5–5.0)
Alkaline Phosphatase: 119 U/L (ref 38–126)
Anion gap: 18 — ABNORMAL HIGH (ref 5–15)
BUN: 113 mg/dL — ABNORMAL HIGH (ref 6–20)
CALCIUM: 10.1 mg/dL (ref 8.9–10.3)
CHLORIDE: 127 mmol/L — AB (ref 101–111)
CO2: 22 mmol/L (ref 22–32)
CREATININE: 3.03 mg/dL — AB (ref 0.44–1.00)
GFR, EST AFRICAN AMERICAN: 15 mL/min — AB (ref >60)
GFR, EST NON AFRICAN AMERICAN: 13 mL/min — AB (ref >60)
Glucose, Bld: 288 mg/dL — ABNORMAL HIGH (ref 65–99)
Potassium: 4 mmol/L (ref 3.5–5.1)
Sodium: 167 mmol/L (ref 135–145)
Total Bilirubin: 1 mg/dL (ref 0.3–1.2)
Total Protein: 7.2 g/dL (ref 6.5–8.1)

## 2017-08-07 LAB — CBC
HCT: 50 % — ABNORMAL HIGH (ref 35.0–47.0)
Hemoglobin: 16.2 g/dL — ABNORMAL HIGH (ref 12.0–16.0)
MCH: 33.4 pg (ref 26.0–34.0)
MCHC: 32.3 g/dL (ref 32.0–36.0)
MCV: 103.5 fL — ABNORMAL HIGH (ref 80.0–100.0)
Platelets: 173 K/uL (ref 150–440)
RBC: 4.83 MIL/uL (ref 3.80–5.20)
RDW: 16.1 % — ABNORMAL HIGH (ref 11.5–14.5)
WBC: 11 K/uL (ref 3.6–11.0)

## 2017-08-07 NOTE — Progress Notes (Signed)
Location:   [facility] Room Number: [place] of Service:  SNF [phone]) Provider:  Toni Arthurs, NP-C  Kirk Ruths, MD  Patient Care Team: Kirk Ruths, MD as PCP - General (Internal Medicine) Toni Arthurs, NP as Nurse Practitioner (Family Medicine)  Extended Emergency Contact Information Primary Emergency Contact: Coble,Dean Address: [address] [place], [place] [address] [place] Phone: [phone] Relation: Son Secondary Emergency Contact: Jolaine Click Address: [address] [place], [place] [address] Home Phone: [phone] Work Phone: [phone] Relation: None  Code Status:  DNR Goals of care: Advanced Directive information Advanced Directives 08/07/2017  Does Patient Have a Medical Advance Directive? Yes  Type of Paramedic of [place];Living will;Out of facility DNR (pink MOST or yellow form)  Does patient want to make changes to medical advance directive? No - Patient declined  Copy of [place] in Chart? Yes  Pre-existing out of facility DNR order (yellow form or pink MOST form) Yellow form placed in chart (order not valid for inpatient use)     Chief Complaint  Patient presents with  . Acute Visit    Altered Mental Status    HPI:  Pt is a 84 y.o. female seen today for an acute visit for Altered mental status/ confusion. Pt has had a sudden change in mental status. Pt is now confused, minimally responsive, moaning out more but not formulating words. Nursing reports yesterday evening, she was not able to swallow her meds. Nursing downgraded fluids to nectar thick over the weekend due to coughing. Labs obtained- shows acute on chronic renal failure and severe hypernatremia. Will initiate a trial of IVF for increased hydration/ increased fluid volume. If no improvement by tomorrow with status or labs, will re-consult Hospice and stop fluids. Nursing has  notified and spoken with son regarding pt's changes.   Please note pt with limited verbal/cognitive ability. Unable to obtain complete ROS. Some ROS info obtained from staff and documentation.   Past Medical History:  Diagnosis Date  . A-fib ([place])   . Chronic CHF (congestive heart failure) ([place])   . Diabetes mellitus with stage 2 chronic kidney disease ([place])   . Hx of transient ischemic attack (TIA)   . Hyperlipidemia   . Hypertension   . Varicose veins    Past Surgical History:  Procedure Laterality Date  . APPENDECTOMY    . CHOLECYSTECTOMY    . CORONARY ARTERY BYPASS GRAFT N/A 07/27/2013   Procedure: CORONARY ARTERY BYPASS GRAFTING (CABG) POSS. MITRAL REPAIR;  Surgeon: [name], MD;  Location: [facility];  Service: Open Heart Surgery;  Laterality: N/A;  Coronary artery bypass graft times four using left internal mammary artery and right saphenous leg vein using endoscope.  Marland Kitchen KNEE SURGERY      No Known Allergies  Allergies as of 08/07/2017   No Known Allergies     Medication List        Accurate as of 08/07/17  2:20 PM. Always use your most recent med list.          acetaminophen 325 MG tablet Commonly known as:  TYLENOL Take 650 mg by mouth every 4 (four) hours as needed for fever. May administer orally, per G-tube if needed or rectally if unable to swallow. Maximum dose for 24 hours is 3000 mg from all sources of Acetaminophen [place]  acetaminophen 325 MG tablet Commonly known as:  TYLENOL Take 650 mg by mouth 4 (four) times daily.   ASPERCREME W/LIDOCAINE 4 % cream Generic drug:  lidocaine Apply 1 application topically 3 (three) times daily. Apply thin layer to bilateral knees   aspirin 81 MG chewable tablet Chew 1 tablet (81 mg total) by mouth daily.   Cholecalciferol 2000 units Caps Take 1 capsule by mouth daily.   cyanocobalamin 1000 MCG tablet Take 1,000 mcg by mouth daily.   ELIQUIS 2.5 MG Tabs tablet Generic drug:  apixaban Take 2.5 mg by  mouth 2 (two) times daily.   LORazepam 0.5 MG tablet Commonly known as:  ATIVAN Take 1 tablet (0.5 mg total) by mouth 3 (three) times daily.   Magnesium Oxide 200 MG Tabs Take 200 mg by mouth daily.   Melatonin 3 MG Tabs Take 3 mg by mouth at bedtime.   oxyCODONE 5 MG immediate release tablet Commonly known as:  Oxy IR/ROXICODONE Take 5 mg by mouth 4 (four) times daily. pain. Hold for sedation   polyethylene glycol packet Commonly known as:  MIRALAX / GLYCOLAX Take 17 g by mouth daily. Please add to 4-8oz of fluid   potassium chloride SA 20 MEQ tablet Commonly known as:  K-DUR,KLOR-CON Take 20 mEq by mouth 2 (two) times daily.   pravastatin 10 MG tablet Commonly known as:  PRAVACHOL Take 10 mg by mouth once a week. On Sunday   PRESERVISION AREDS Tabs Take 1 tablet by mouth daily.   SENNA-PLUS 8.6-50 MG tablet Generic drug:  senna-docusate Take 1 tablet by mouth 2 (two) times daily as needed.   torsemide 20 MG tablet Commonly known as:  DEMADEX Take 30 mg by mouth daily. 1 and 1/2 tablet   traZODone 50 MG tablet Commonly known as:  DESYREL Take 50 mg by mouth at bedtime.   UTI-STAT Liqd Take 30 mLs by mouth daily.       Review of Systems  Unable to perform ROS: Patient unresponsive  Constitutional: Positive for activity change, appetite change and fatigue.  HENT: Negative.   Respiratory: Negative.   Cardiovascular: Negative.   Gastrointestinal: Negative.   Genitourinary: Negative.   Skin: Negative.   Neurological: Positive for speech difficulty and weakness.  Psychiatric/Behavioral: Positive for agitation, confusion and hallucinations.    Immunization History  Administered Date(s) Administered  . Influenza-Unspecified 11/07/2013, 11/17/2014, 11/11/2015, 11/15/2016  . PPD Test 11/24/2014, 11/27/2015  . Pneumococcal-Unspecified 01/21/2014   Pertinent  Health Maintenance Due  Topic Date Due  . FOOT EXAM  10/06/1938  . OPHTHALMOLOGY EXAM  10/06/1938    . URINE MICROALBUMIN  10/06/1938  . DEXA SCAN  10/07/1995  . HEMOGLOBIN A1C  01/26/2014  . PNA vac Low Risk Adult (2 of 2 - PCV13) 01/22/2015  . INFLUENZA VACCINE  09/27/2017   No flowsheet data found. Functional Status Survey:    Vitals:   08/07/17 1352  BP: 113/60  Pulse: 96  Resp: (!) 28  Temp: 97.7 F (36.5 C)  TempSrc: Oral  SpO2: 97%  Weight: 190 lb (86.2 kg)  Height: '5\' 7"'$  (1.702 m)   Body mass index is 29.76 kg/m. Physical Exam  Constitutional: She appears well-developed and well-nourished. She is active. She has a sickly appearance. She does not appear ill. No distress.  HENT:  Head: Normocephalic and atraumatic.  Mouth/Throat: Uvula is midline, oropharynx is clear and moist and mucous membranes are normal. Mucous membranes are not pale, not dry and not cyanotic.  Eyes: Pupils  are equal, round, and reactive to light. Conjunctivae, EOM and lids are normal.  Neck: Trachea normal, normal range of motion and full passive range of motion without pain. Neck supple. No JVD present. No tracheal deviation, no edema and no erythema present. No thyromegaly present.  Cardiovascular: Normal heart sounds, intact distal pulses and normal pulses. An irregularly irregular rhythm present. Tachycardia present. Exam reveals no gallop, no distant heart sounds and no friction rub.  No murmur heard. Pulses:      Dorsalis pedis pulses are 2+ on the right side, and 2+ on the left side.  No edema  Pulmonary/Chest: Effort normal. No accessory muscle usage. No respiratory distress. She has decreased breath sounds in the right upper field, the right middle field, the right lower field, the left upper field, the left middle field and the left lower field. She has no wheezes. She has no rhonchi. She has no rales. She exhibits no tenderness.  Abdominal: Soft. Normal appearance and bowel sounds are normal. She exhibits no distension and no ascites. There is no tenderness.  Musculoskeletal: Normal  range of motion. She exhibits no edema or tenderness.  Expected osteoarthritis, stiffness; Bilateral Calves soft, supple. Negative Homan's Sign. B- pedal pulses equal; generalized weakness  Neurological: She is unresponsive. She displays atrophy. A cranial nerve deficit and sensory deficit is present. She exhibits abnormal muscle tone. Coordination and gait abnormal.  Skin: Skin is warm, dry and intact. She is not diaphoretic. No cyanosis. No pallor. Nails show no clubbing.  Psychiatric: She is agitated, withdrawn and actively hallucinating. Cognition and memory are impaired. She expresses impulsivity. She is noncommunicative. She exhibits abnormal recent memory and abnormal remote memory. She is inattentive.  Nursing note and vitals reviewed.   Labs reviewed: Recent Labs    12/06/16 0652 01/02/17 0432  01/04/17 0440 01/05/17 0450 06/28/17 0840  NA 139 138  --   --   --  140  K 4.0 2.7*   < > 2.7* 3.7 3.6  CL 104 100*  --   --   --  102  CO2 26 26  --   --   --  29  GLUCOSE 93 92  --   --   --  85  BUN 38* 31*  --   --   --  35*  CREATININE 1.44* 1.32*  --   --   --  1.11*  CALCIUM 8.8* 8.6*  --   --   --  8.9  MG  --   --   --   --   --  2.0   < > = values in this interval not displayed.   Recent Labs    12/06/16 0652 01/02/17 0432 06/28/17 0840  AST _0 ALT 12* 11* 12*  ALKPHOS 49 57 56  BILITOT 0.5 0.8 0.7  PROT 5.6* 5.7* 6.0*  ALBUMIN 2.8* 2.9* 3.5   Recent Labs    11/01/16 0423 06/28/17 0840  WBC 5.3 5.9  NEUTROABS 2.9 2.8  HGB 13.9 11.4*  HCT 42.4 34.1*  MCV 97.4 98.5  PLT 147* 165   Lab Results  Component Value Date   TSH 3.676 06/28/2017   Lab Results  Component Value Date   HGBA1C 6.0 (H) 07/26/2013   Lab Results  Component Value Date   CHOL 142 04/27/2016   HDL 56 04/27/2016   LDLCALC 70 04/27/2016   TRIG 81 04/27/2016   CHOLHDL 2.5 04/27/2016    Significant Diagnostic Results in  last 30 days:  No results  found.  Assessment/Plan  Altered mental status, unspecified altered mental status type  Hypernatremia  Acute renal failure superimposed on chronic kidney disease, unspecified CKD stage, unspecified acute renal failure type ([place])   OK to keep pt in the bed  Insert and maintain peripheral IV for infusion of IVF  0.45% NS  Give 250 mL bolus over 1 hour, then  Continuous infusion at 75 mL/ hr x 72 hours  Re-check labs in the am  Hospice consult  Updated Palliative Care NP- she will also speak with the son  Morphine 20 mg/ml 0.25-0.5 mL po Q 1 hour prn for pain, dyspnea #30 mL  DC non-essential meds (vitamins, etc.) if not better tomorrow  Safety precautions  Fall precautions   Assist with meals/ADLs, etc  Family/ staff Communication:   Total Time:  Documentation:  Face to Face:  Family/Phone:   Labs/tests ordered: CBC, Met C this am, repeat in the morning  Medication list reviewed and assessed for continued appropriateness.  Vikki Ports, NP-C Geriatrics [facility] [phone] [address], [place] [address] Cell Phone (Mon-Fri 8am-5pm):  [phone] On Call:  [phone] & follow prompts after 5pm & weekends Office Phone:  [phone] Office Fax:  [phone]

## 2017-08-08 ENCOUNTER — Other Ambulatory Visit: Payer: Self-pay

## 2017-08-08 ENCOUNTER — Other Ambulatory Visit
Admission: RE | Admit: 2017-08-08 | Discharge: 2017-08-08 | Disposition: A | Discharge status: Home / Self Care | Payer: Medicare Other | Source: Ambulatory Visit | Attending: Gerontology | Admitting: Gerontology

## 2017-08-08 DIAGNOSIS — E870 Hyperosmolality and hypernatremia: Secondary | ICD-10-CM | POA: Diagnosis not present

## 2017-08-08 DIAGNOSIS — N179 Acute kidney failure, unspecified: Secondary | ICD-10-CM | POA: Insufficient documentation

## 2017-08-08 DIAGNOSIS — R4182 Altered mental status, unspecified: Secondary | ICD-10-CM | POA: Insufficient documentation

## 2017-08-08 DIAGNOSIS — N189 Chronic kidney disease, unspecified: Secondary | ICD-10-CM | POA: Diagnosis not present

## 2017-08-08 LAB — CBC WITH DIFFERENTIAL/PLATELET
BASOS ABS: 0 10*3/uL (ref 0–0.1)
BASOS PCT: 0 %
Eosinophils Absolute: 0.2 10*3/uL (ref 0–0.7)
Eosinophils Relative: 2 %
HEMATOCRIT: 44.4 % (ref 35.0–47.0)
HEMOGLOBIN: 14.4 g/dL (ref 12.0–16.0)
Lymphocytes Relative: 14 %
Lymphs Abs: 1.6 10*3/uL (ref 1.0–3.6)
MCH: 33.4 pg (ref 26.0–34.0)
MCHC: 32.4 g/dL (ref 32.0–36.0)
MCV: 103 fL — ABNORMAL HIGH (ref 80.0–100.0)
Monocytes Absolute: 0.6 10*3/uL (ref 0.2–0.9)
Monocytes Relative: 5 %
NEUTROS ABS: 9.2 10*3/uL — AB (ref 1.4–6.5)
NEUTROS PCT: 79 %
Platelets: 161 10*3/uL (ref 150–440)
RBC: 4.32 MIL/uL (ref 3.80–5.20)
RDW: 16.1 % — ABNORMAL HIGH (ref 11.5–14.5)
WBC: 11.6 10*3/uL — AB (ref 3.6–11.0)

## 2017-08-08 LAB — COMPREHENSIVE METABOLIC PANEL
ALBUMIN: 3.4 g/dL — AB (ref 3.5–5.0)
ALK PHOS: 115 U/L (ref 38–126)
ALT: 22 U/L (ref 14–54)
AST: 23 U/L (ref 15–41)
BILIRUBIN TOTAL: 1.1 mg/dL (ref 0.3–1.2)
BUN: 110 mg/dL — AB (ref 6–20)
CALCIUM: 9.5 mg/dL (ref 8.9–10.3)
CO2: 21 mmol/L — AB (ref 22–32)
Chloride: >130 mmol/L (ref 101–111)
Creatinine, Ser: 2.82 mg/dL — ABNORMAL HIGH (ref 0.44–1.00)
GFR calc Af Amer: 16 mL/min — ABNORMAL LOW (ref >60)
GFR calc non Af Amer: 14 mL/min — ABNORMAL LOW (ref >60)
GLUCOSE: 243 mg/dL — AB (ref 65–99)
POTASSIUM: 3.7 mmol/L (ref 3.5–5.1)
SODIUM: 167 mmol/L — AB (ref 135–145)
Total Protein: 6.7 g/dL (ref 6.5–8.1)

## 2017-08-08 MED ORDER — OXYCODONE HCL 5 MG PO TABS: 5.0000 mg | ORAL_TABLET | Freq: Four times a day (QID) | ORAL | 0 refills | Status: AC

## 2017-08-08 NOTE — Telephone Encounter (Signed)
Rx sent to [facility] phone : [phone] , fax : [phone]  

## 2017-08-13 NOTE — Assessment & Plan Note (Signed)
Stable. Improved. On Pravastatin 10 mg po Q Week

## 2017-08-13 NOTE — Progress Notes (Signed)
Location:    [facility] Room Number: 310A Place of Service:  SNF (31) Provider:  Toni Arthurs, NP-C  Kirk Ruths, MD  Patient Care Team: Kirk Ruths, MD as PCP - General (Internal Medicine) Toni Arthurs, NP as Nurse Practitioner (Family Medicine)  Extended Emergency Contact Information Primary Emergency Contact: Coble,Dean Address: [address] [place], [place] [address] [place] Phone: [phone] Relation: Son Secondary Emergency Contact: Jolaine Click Address: [address] [place], [place] [address] Home Phone: [phone] Work Phone: [phone] Relation: None  Code Status:  DNR Goals of care: Advanced Directive information Advanced Directives 08/07/2017  Does Patient Have a Medical Advance Directive? Yes  Type of Paramedic of [place];Living will;Out of facility DNR (pink MOST or yellow form)  Does patient want to make changes to medical advance directive? No - Patient declined  Copy of [place] in Chart? Yes  Pre-existing out of facility DNR order (yellow form or pink MOST form) Yellow form placed in chart (order not valid for inpatient use)     Chief Complaint  Patient presents with  . Medical Management of Chronic Issues    Routine Visit    HPI:  Pt is a 84 y.o. female seen today for medical management of chronic diseases.    Bradycardia Stable. HR elevated above baseline. Health declining.  Deficiency of other specified B group vitamins Stable. On Cyanocobalamin 1,000 mcg po Q Day  Hyperlipidemia Stable. Improved. On Pravastatin 10 mg po Q Week  Please note pt with limited verbal/cognitive ability. Unable to obtain complete ROS. Some ROS info obtained from staff and documentation.     Past Medical History:  Diagnosis Date  . A-fib ([place])   . Chronic CHF (congestive heart failure) ([place])   . Diabetes mellitus with stage 2 chronic kidney disease ([place])   . Hx of  transient ischemic attack (TIA)   . Hyperlipidemia   . Hypertension   . Varicose veins    Past Surgical History:  Procedure Laterality Date  . APPENDECTOMY    . CHOLECYSTECTOMY    . CORONARY ARTERY BYPASS GRAFT N/A 07/27/2013   Procedure: CORONARY ARTERY BYPASS GRAFTING (CABG) POSS. MITRAL REPAIR;  Surgeon: [name], MD;  Location: [facility];  Service: Open Heart Surgery;  Laterality: N/A;  Coronary artery bypass graft times four using left internal mammary artery and right saphenous leg vein using endoscope.  Marland Kitchen KNEE SURGERY      No Known Allergies  Allergies as of 07/20/2017   No Known Allergies     Medication List        Accurate as of 07/20/17 11:59 PM. Always use your most recent med list.          acetaminophen 325 MG tablet Commonly known as:  TYLENOL Take 650 mg by mouth every 4 (four) hours as needed for fever. May administer orally, per G-tube if needed or rectally if unable to swallow. Maximum dose for 24 hours is 3000 mg from all sources of Acetaminophen /Tylenol   acetaminophen 325 MG tablet Commonly known as:  TYLENOL Take 650 mg by mouth 4 (four) times daily.   ASPERCREME W/LIDOCAINE 4 % cream Generic drug:  lidocaine Apply 1 application topically 3 (three) times daily. Apply thin layer to bilateral knees   aspirin 81 MG chewable tablet Chew 1 tablet (81 mg total) by mouth  daily.   Cholecalciferol 2000 units Caps Take 1 capsule by mouth daily.   cyanocobalamin 1000 MCG tablet Take 1,000 mcg by mouth daily.   divalproex 500 MG DR tablet Commonly known as:  DEPAKOTE Take 500 mg by mouth 2 (two) times daily.   ELIQUIS 2.5 MG Tabs tablet Generic drug:  apixaban Take 2.5 mg by mouth 2 (two) times daily.   LORazepam 0.5 MG tablet Commonly known as:  ATIVAN Take 1 tablet (0.5 mg total) by mouth 3 (three) times daily.   Magnesium Oxide 200 MG Tabs Take 200 mg by mouth daily.   Melatonin 3 MG Tabs Take 3 mg by mouth at bedtime.   oxyCODONE  5 MG immediate release tablet Commonly known as:  Oxy IR/ROXICODONE Take 5 mg by mouth 4 (four) times daily. pain. Hold for sedation   polyethylene glycol packet Commonly known as:  MIRALAX / GLYCOLAX Take 17 g by mouth daily. Please add to 4-8oz of fluid   potassium chloride SA 20 MEQ tablet Commonly known as:  K-DUR,KLOR-CON Take 20 mEq by mouth 2 (two) times daily.   pravastatin 10 MG tablet Commonly known as:  PRAVACHOL Take 10 mg by mouth once a week. On Sunday   PRESERVISION AREDS Tabs Take 1 tablet by mouth daily.   SENNA-PLUS 8.6-50 MG tablet Generic drug:  senna-docusate Take 1 tablet by mouth 2 (two) times daily as needed.   torsemide 20 MG tablet Commonly known as:  DEMADEX Take 30 mg by mouth daily. 1 and 1/2 tablet   traZODone 50 MG tablet Commonly known as:  DESYREL Take 50 mg by mouth at bedtime.   UTI-STAT Liqd Take 30 mLs by mouth daily.       Review of Systems  Unable to perform ROS: Dementia  Constitutional: Positive for activity change, appetite change and fatigue. Negative for chills, diaphoresis and fever.  HENT: Negative for congestion, mouth sores, nosebleeds, postnasal drip, sneezing, sore throat, trouble swallowing and voice change.   Respiratory: Negative for apnea, cough, choking, chest tightness, shortness of breath and wheezing.   Cardiovascular: Negative for chest pain, palpitations and leg swelling.  Gastrointestinal: Negative for abdominal distention, abdominal pain, constipation, diarrhea and nausea.  Genitourinary: Negative for difficulty urinating, dysuria, frequency and urgency.  Musculoskeletal: Positive for arthralgias (typical arthritis) and gait problem. Negative for back pain and myalgias.  Skin: Negative for color change, pallor, rash and wound.  Neurological: Positive for weakness. Negative for dizziness, tremors, syncope, speech difficulty, numbness and headaches.  Psychiatric/Behavioral: Positive for agitation (at times),  confusion and hallucinations. Negative for behavioral problems. The patient is nervous/anxious.   All other systems reviewed and are negative.   Immunization History  Administered Date(s) Administered  . Influenza-Unspecified 11/07/2013, 11/17/2014, 11/11/2015, 11/15/2016  . PPD Test 11/24/2014, 11/27/2015  . Pneumococcal-Unspecified 01/21/2014   Pertinent  Health Maintenance Due  Topic Date Due  . FOOT EXAM  10/06/1938  . OPHTHALMOLOGY EXAM  10/06/1938  . URINE MICROALBUMIN  10/06/1938  . DEXA SCAN  10/07/1995  . HEMOGLOBIN A1C  01/26/2014  . PNA vac Low Risk Adult (2 of 2 - PCV13) 01/22/2015  . INFLUENZA VACCINE  09/27/2017   No flowsheet data found. Functional Status Survey:    Vitals:   07/20/17 0927  BP: 116/81  Pulse: (!) 104  Resp: 20  Temp: 97.9 F (36.6 C)  TempSrc: Oral  SpO2: 97%  Weight: 204 lb 9.6 oz (92.8 kg)  Height: 5\' 7"  (1.702 m)   Body mass index  is 32.04 kg/m. Physical Exam  Constitutional: Vital signs are normal. She appears well-developed and well-nourished. She appears lethargic. She is sleeping and cooperative. She does not appear ill. No distress.  HENT:  Head: Normocephalic and atraumatic.  Mouth/Throat: Uvula is midline, oropharynx is clear and moist and mucous membranes are normal. Mucous membranes are not pale, not dry and not cyanotic.  Eyes: Pupils are equal, round, and reactive to light. Conjunctivae, EOM and lids are normal.  Neck: Trachea normal, normal range of motion and full passive range of motion without pain. Neck supple. No JVD present. No tracheal deviation, no edema and no erythema present. No thyromegaly present.  Cardiovascular: Normal rate, normal heart sounds, intact distal pulses and normal pulses. An irregular rhythm present. Exam reveals no gallop, no distant heart sounds and no friction rub.  No murmur heard. Pulses:      Dorsalis pedis pulses are 2+ on the right side, and 2+ on the left side.  No edema    Pulmonary/Chest: Effort normal and breath sounds normal. No accessory muscle usage. No respiratory distress. She has no decreased breath sounds. She has no wheezes. She has no rhonchi. She has no rales. She exhibits no tenderness.  Abdominal: Soft. Normal appearance and bowel sounds are normal. She exhibits no distension and no ascites. There is no tenderness.  Musculoskeletal: Normal range of motion. She exhibits no edema or tenderness.  Expected osteoarthritis, stiffness; Bilateral Calves soft, supple. Negative Homan's Sign. B- pedal pulses equal; generalized weakness  Neurological: She appears lethargic. She displays atrophy. A sensory deficit is present. She exhibits abnormal muscle tone. Coordination and gait abnormal.  Sleeping more, yelling out more  Skin: Skin is warm, dry and intact. She is not diaphoretic. No cyanosis. No pallor. Nails show no clubbing.  Psychiatric: Thought content normal. Her mood appears anxious. Her speech is tangential. She is agitated (at times) and actively hallucinating. Cognition and memory are impaired. She expresses impulsivity. She exhibits abnormal recent memory and abnormal remote memory. She is inattentive.  Nursing note and vitals reviewed.   Labs reviewed: Recent Labs    06/28/17 0840 08/07/17 1320 08/08/17 1303  NA 140 167* 167*  K 3.6 4.0 3.7  CL 102 127* >130*  CO2 29 22 21*  GLUCOSE 85 288* 243*  BUN 35* 113* 110*  CREATININE 1.11* 3.03* 2.82*  CALCIUM 8.9 10.1 9.5  MG 2.0  --   --    Recent Labs    06/28/17 0840 08/07/17 1320 08/08/17 1303  AST 15 24 23   ALT 12* 26 22  ALKPHOS 56 119 115  BILITOT 0.7 1.0 1.1  PROT 6.0* 7.2 6.7  ALBUMIN 3.5 3.7 3.4*   Recent Labs    11/01/16 0423 06/28/17 0840 08/07/17 1320 08/08/17 1303  WBC 5.3 5.9 11.0 11.6*  NEUTROABS 2.9 2.8  --  9.2*  HGB 13.9 11.4* 16.2* 14.4  HCT 42.4 34.1* 50.0* 44.4  MCV 97.4 98.5 103.5* 103.0*  PLT 147* 165 173 161   Lab Results  Component Value Date    TSH 3.676 06/28/2017   Lab Results  Component Value Date   HGBA1C 6.0 (H) 07/26/2013   Lab Results  Component Value Date   CHOL 142 04/27/2016   HDL 56 04/27/2016   LDLCALC 70 04/27/2016   TRIG 81 04/27/2016   CHOLHDL 2.5 04/27/2016    Significant Diagnostic Results in last 30 days:  No results found.  Assessment/Plan Junette was seen today for medical management of chronic issues.  Diagnoses and all orders for this visit:  Bradycardia  Deficiency of other specified B group vitamins  Hyperlipidemia, unspecified hyperlipidemia type   Above listed conditions stable  Continue current medication regimen  Pt's health is overall declining  Continue to monitor for increased changes  Continue to be followed by Palliative Care  OOB to chair in common area daily for supervision  Safety precautions  Fall precautions  Family/ staff Communication:   Total Time:  Documentation:  Face to Face:  Family/Phone:   Labs/tests ordered:  Not due, recent labs reviewed- stable  Medication list reviewed and assessed for continued appropriateness. Monthly medication orders reviewed and signed.  Vikki Ports, NP-C Geriatrics [facility] [id number], [place] [address] Cell Phone (Mon-Fri 8am-5pm):  [phone] On Call:  [phone] & follow prompts after 5pm & weekends Office Phone:  [phone] Office Fax:  [phone]

## 2017-08-13 NOTE — Assessment & Plan Note (Signed)
Stable. On Cyanocobalamin 1,000 mcg po Q Day

## 2017-08-13 NOTE — Assessment & Plan Note (Signed)
Stable. HR elevated above baseline. Health declining.

## 2017-08-27 DEATH — deceased
